# Patient Record
Sex: Female | Born: 1945 | Race: Black or African American | Hispanic: No | Smoking: Never smoker
Health system: Southern US, Community
[De-identification: ages and names within clinical notes are randomized; demographics above are authoritative.]

## PROBLEM LIST (undated history)

## (undated) DIAGNOSIS — M069 Rheumatoid arthritis, unspecified: Secondary | ICD-10-CM

## (undated) DIAGNOSIS — E119 Type 2 diabetes mellitus without complications: Secondary | ICD-10-CM

## (undated) DIAGNOSIS — H269 Unspecified cataract: Secondary | ICD-10-CM

## (undated) DIAGNOSIS — M1990 Unspecified osteoarthritis, unspecified site: Secondary | ICD-10-CM

## (undated) DIAGNOSIS — I10 Essential (primary) hypertension: Secondary | ICD-10-CM

## (undated) DIAGNOSIS — E7800 Pure hypercholesterolemia, unspecified: Secondary | ICD-10-CM

## (undated) DIAGNOSIS — Z973 Presence of spectacles and contact lenses: Secondary | ICD-10-CM

## (undated) DIAGNOSIS — D649 Anemia, unspecified: Secondary | ICD-10-CM

## (undated) HISTORY — PX: ABDOMINAL HYSTERECTOMY: SHX81

## (undated) HISTORY — PX: BUNIONECTOMY: SHX129

## (undated) HISTORY — DX: Unspecified cataract: H26.9

## (undated) HISTORY — PX: CATARACT EXTRACTION, BILATERAL: SHX1313

---

## 2012-02-25 DIAGNOSIS — E669 Obesity, unspecified: Secondary | ICD-10-CM | POA: Diagnosis not present

## 2012-02-25 DIAGNOSIS — E119 Type 2 diabetes mellitus without complications: Secondary | ICD-10-CM | POA: Diagnosis not present

## 2012-02-25 DIAGNOSIS — R5381 Other malaise: Secondary | ICD-10-CM | POA: Diagnosis not present

## 2012-02-25 DIAGNOSIS — I10 Essential (primary) hypertension: Secondary | ICD-10-CM | POA: Diagnosis not present

## 2012-02-25 DIAGNOSIS — R5383 Other fatigue: Secondary | ICD-10-CM | POA: Diagnosis not present

## 2012-03-10 DIAGNOSIS — E669 Obesity, unspecified: Secondary | ICD-10-CM | POA: Diagnosis not present

## 2012-03-10 DIAGNOSIS — E119 Type 2 diabetes mellitus without complications: Secondary | ICD-10-CM | POA: Diagnosis not present

## 2012-03-10 DIAGNOSIS — I10 Essential (primary) hypertension: Secondary | ICD-10-CM | POA: Diagnosis not present

## 2012-03-10 DIAGNOSIS — E7800 Pure hypercholesterolemia, unspecified: Secondary | ICD-10-CM | POA: Diagnosis not present

## 2012-03-23 DIAGNOSIS — M25519 Pain in unspecified shoulder: Secondary | ICD-10-CM | POA: Diagnosis not present

## 2012-03-23 DIAGNOSIS — S199XXA Unspecified injury of neck, initial encounter: Secondary | ICD-10-CM | POA: Diagnosis not present

## 2012-03-23 DIAGNOSIS — S4980XA Other specified injuries of shoulder and upper arm, unspecified arm, initial encounter: Secondary | ICD-10-CM | POA: Diagnosis not present

## 2012-03-23 DIAGNOSIS — S40019A Contusion of unspecified shoulder, initial encounter: Secondary | ICD-10-CM | POA: Diagnosis not present

## 2012-03-23 DIAGNOSIS — S139XXA Sprain of joints and ligaments of unspecified parts of neck, initial encounter: Secondary | ICD-10-CM | POA: Diagnosis not present

## 2012-03-24 DIAGNOSIS — I10 Essential (primary) hypertension: Secondary | ICD-10-CM | POA: Diagnosis not present

## 2012-03-24 DIAGNOSIS — S139XXA Sprain of joints and ligaments of unspecified parts of neck, initial encounter: Secondary | ICD-10-CM | POA: Diagnosis not present

## 2012-03-24 DIAGNOSIS — M5030 Other cervical disc degeneration, unspecified cervical region: Secondary | ICD-10-CM | POA: Diagnosis not present

## 2012-03-24 DIAGNOSIS — M25519 Pain in unspecified shoulder: Secondary | ICD-10-CM | POA: Diagnosis not present

## 2012-03-24 DIAGNOSIS — Z87891 Personal history of nicotine dependence: Secondary | ICD-10-CM | POA: Diagnosis not present

## 2012-03-24 DIAGNOSIS — R51 Headache: Secondary | ICD-10-CM | POA: Diagnosis not present

## 2012-03-24 DIAGNOSIS — Z79899 Other long term (current) drug therapy: Secondary | ICD-10-CM | POA: Diagnosis not present

## 2012-03-24 DIAGNOSIS — S0990XA Unspecified injury of head, initial encounter: Secondary | ICD-10-CM | POA: Diagnosis not present

## 2012-03-24 DIAGNOSIS — M47812 Spondylosis without myelopathy or radiculopathy, cervical region: Secondary | ICD-10-CM | POA: Diagnosis not present

## 2012-03-24 DIAGNOSIS — E119 Type 2 diabetes mellitus without complications: Secondary | ICD-10-CM | POA: Diagnosis not present

## 2012-03-28 DIAGNOSIS — E785 Hyperlipidemia, unspecified: Secondary | ICD-10-CM | POA: Diagnosis not present

## 2012-03-28 DIAGNOSIS — R55 Syncope and collapse: Secondary | ICD-10-CM | POA: Diagnosis not present

## 2012-03-28 DIAGNOSIS — R404 Transient alteration of awareness: Secondary | ICD-10-CM | POA: Diagnosis not present

## 2012-03-28 DIAGNOSIS — Z87891 Personal history of nicotine dependence: Secondary | ICD-10-CM | POA: Diagnosis not present

## 2012-03-28 DIAGNOSIS — R5383 Other fatigue: Secondary | ICD-10-CM | POA: Diagnosis not present

## 2012-03-28 DIAGNOSIS — E7800 Pure hypercholesterolemia, unspecified: Secondary | ICD-10-CM | POA: Diagnosis not present

## 2012-03-28 DIAGNOSIS — Z79899 Other long term (current) drug therapy: Secondary | ICD-10-CM | POA: Diagnosis not present

## 2012-03-28 DIAGNOSIS — N390 Urinary tract infection, site not specified: Secondary | ICD-10-CM | POA: Diagnosis not present

## 2012-03-28 DIAGNOSIS — F411 Generalized anxiety disorder: Secondary | ICD-10-CM | POA: Diagnosis not present

## 2012-03-28 DIAGNOSIS — E119 Type 2 diabetes mellitus without complications: Secondary | ICD-10-CM | POA: Diagnosis not present

## 2012-03-28 DIAGNOSIS — E876 Hypokalemia: Secondary | ICD-10-CM | POA: Diagnosis not present

## 2012-03-28 DIAGNOSIS — I10 Essential (primary) hypertension: Secondary | ICD-10-CM | POA: Diagnosis not present

## 2012-03-28 DIAGNOSIS — I517 Cardiomegaly: Secondary | ICD-10-CM | POA: Diagnosis not present

## 2012-03-29 DIAGNOSIS — E876 Hypokalemia: Secondary | ICD-10-CM | POA: Diagnosis not present

## 2012-03-29 DIAGNOSIS — N390 Urinary tract infection, site not specified: Secondary | ICD-10-CM | POA: Diagnosis not present

## 2012-03-30 DIAGNOSIS — N390 Urinary tract infection, site not specified: Secondary | ICD-10-CM | POA: Diagnosis not present

## 2012-04-05 DIAGNOSIS — R42 Dizziness and giddiness: Secondary | ICD-10-CM | POA: Diagnosis not present

## 2012-04-21 DIAGNOSIS — R55 Syncope and collapse: Secondary | ICD-10-CM | POA: Diagnosis not present

## 2012-04-21 DIAGNOSIS — Z79899 Other long term (current) drug therapy: Secondary | ICD-10-CM | POA: Diagnosis not present

## 2012-04-21 DIAGNOSIS — R5383 Other fatigue: Secondary | ICD-10-CM | POA: Diagnosis not present

## 2012-04-21 DIAGNOSIS — R42 Dizziness and giddiness: Secondary | ICD-10-CM | POA: Diagnosis not present

## 2012-04-21 DIAGNOSIS — I10 Essential (primary) hypertension: Secondary | ICD-10-CM | POA: Diagnosis not present

## 2012-04-21 DIAGNOSIS — R404 Transient alteration of awareness: Secondary | ICD-10-CM | POA: Diagnosis not present

## 2012-04-21 DIAGNOSIS — Z87891 Personal history of nicotine dependence: Secondary | ICD-10-CM | POA: Diagnosis not present

## 2012-04-21 DIAGNOSIS — E7800 Pure hypercholesterolemia, unspecified: Secondary | ICD-10-CM | POA: Diagnosis not present

## 2012-04-21 DIAGNOSIS — E119 Type 2 diabetes mellitus without complications: Secondary | ICD-10-CM | POA: Diagnosis not present

## 2012-04-22 DIAGNOSIS — R012 Other cardiac sounds: Secondary | ICD-10-CM | POA: Diagnosis not present

## 2012-05-19 DIAGNOSIS — R209 Unspecified disturbances of skin sensation: Secondary | ICD-10-CM | POA: Diagnosis not present

## 2012-05-19 DIAGNOSIS — R55 Syncope and collapse: Secondary | ICD-10-CM | POA: Diagnosis not present

## 2012-05-19 DIAGNOSIS — R42 Dizziness and giddiness: Secondary | ICD-10-CM | POA: Diagnosis not present

## 2012-05-20 DIAGNOSIS — E669 Obesity, unspecified: Secondary | ICD-10-CM | POA: Diagnosis not present

## 2012-05-20 DIAGNOSIS — E119 Type 2 diabetes mellitus without complications: Secondary | ICD-10-CM | POA: Diagnosis not present

## 2012-05-20 DIAGNOSIS — I10 Essential (primary) hypertension: Secondary | ICD-10-CM | POA: Diagnosis not present

## 2012-05-20 DIAGNOSIS — M1990 Unspecified osteoarthritis, unspecified site: Secondary | ICD-10-CM | POA: Diagnosis not present

## 2012-05-20 DIAGNOSIS — Z23 Encounter for immunization: Secondary | ICD-10-CM | POA: Diagnosis not present

## 2012-05-25 DIAGNOSIS — R569 Unspecified convulsions: Secondary | ICD-10-CM | POA: Diagnosis not present

## 2012-06-21 DIAGNOSIS — R55 Syncope and collapse: Secondary | ICD-10-CM | POA: Diagnosis not present

## 2012-06-21 DIAGNOSIS — R51 Headache: Secondary | ICD-10-CM | POA: Diagnosis not present

## 2012-08-16 DIAGNOSIS — Z1231 Encounter for screening mammogram for malignant neoplasm of breast: Secondary | ICD-10-CM | POA: Diagnosis not present

## 2012-12-16 DIAGNOSIS — E119 Type 2 diabetes mellitus without complications: Secondary | ICD-10-CM | POA: Diagnosis not present

## 2012-12-30 DIAGNOSIS — H40019 Open angle with borderline findings, low risk, unspecified eye: Secondary | ICD-10-CM | POA: Diagnosis not present

## 2013-03-21 DIAGNOSIS — I10 Essential (primary) hypertension: Secondary | ICD-10-CM | POA: Diagnosis not present

## 2013-03-21 DIAGNOSIS — E119 Type 2 diabetes mellitus without complications: Secondary | ICD-10-CM | POA: Diagnosis not present

## 2013-03-21 DIAGNOSIS — E669 Obesity, unspecified: Secondary | ICD-10-CM | POA: Diagnosis not present

## 2013-03-21 DIAGNOSIS — E7800 Pure hypercholesterolemia, unspecified: Secondary | ICD-10-CM | POA: Diagnosis not present

## 2013-03-24 DIAGNOSIS — R0602 Shortness of breath: Secondary | ICD-10-CM | POA: Diagnosis not present

## 2013-03-24 DIAGNOSIS — I10 Essential (primary) hypertension: Secondary | ICD-10-CM | POA: Diagnosis not present

## 2013-03-24 DIAGNOSIS — Z79899 Other long term (current) drug therapy: Secondary | ICD-10-CM | POA: Diagnosis not present

## 2013-03-24 DIAGNOSIS — T783XXA Angioneurotic edema, initial encounter: Secondary | ICD-10-CM | POA: Diagnosis not present

## 2013-03-24 DIAGNOSIS — Z87891 Personal history of nicotine dependence: Secondary | ICD-10-CM | POA: Diagnosis not present

## 2013-03-24 DIAGNOSIS — Z8249 Family history of ischemic heart disease and other diseases of the circulatory system: Secondary | ICD-10-CM | POA: Diagnosis not present

## 2013-03-24 DIAGNOSIS — E119 Type 2 diabetes mellitus without complications: Secondary | ICD-10-CM | POA: Diagnosis not present

## 2013-07-14 DIAGNOSIS — I10 Essential (primary) hypertension: Secondary | ICD-10-CM | POA: Diagnosis not present

## 2013-07-14 DIAGNOSIS — E119 Type 2 diabetes mellitus without complications: Secondary | ICD-10-CM | POA: Diagnosis not present

## 2013-07-18 DIAGNOSIS — B079 Viral wart, unspecified: Secondary | ICD-10-CM | POA: Diagnosis not present

## 2013-09-26 ENCOUNTER — Encounter (HOSPITAL_COMMUNITY): Payer: Self-pay | Admitting: Emergency Medicine

## 2013-09-26 ENCOUNTER — Emergency Department (HOSPITAL_COMMUNITY)
Admission: EM | Admit: 2013-09-26 | Discharge: 2013-09-27 | Disposition: A | Discharge status: Home / Self Care | Payer: Medicare Other | Attending: Emergency Medicine | Admitting: Emergency Medicine

## 2013-09-26 DIAGNOSIS — R55 Syncope and collapse: Secondary | ICD-10-CM | POA: Diagnosis not present

## 2013-09-26 DIAGNOSIS — R61 Generalized hyperhidrosis: Secondary | ICD-10-CM | POA: Diagnosis not present

## 2013-09-26 DIAGNOSIS — F17200 Nicotine dependence, unspecified, uncomplicated: Secondary | ICD-10-CM | POA: Insufficient documentation

## 2013-09-26 DIAGNOSIS — E7800 Pure hypercholesterolemia, unspecified: Secondary | ICD-10-CM | POA: Diagnosis not present

## 2013-09-26 DIAGNOSIS — I10 Essential (primary) hypertension: Secondary | ICD-10-CM | POA: Diagnosis not present

## 2013-09-26 DIAGNOSIS — I498 Other specified cardiac arrhythmias: Secondary | ICD-10-CM | POA: Insufficient documentation

## 2013-09-26 DIAGNOSIS — E119 Type 2 diabetes mellitus without complications: Secondary | ICD-10-CM | POA: Insufficient documentation

## 2013-09-26 DIAGNOSIS — R42 Dizziness and giddiness: Secondary | ICD-10-CM | POA: Diagnosis not present

## 2013-09-26 DIAGNOSIS — R1110 Vomiting, unspecified: Secondary | ICD-10-CM | POA: Diagnosis not present

## 2013-09-26 DIAGNOSIS — Z79899 Other long term (current) drug therapy: Secondary | ICD-10-CM | POA: Insufficient documentation

## 2013-09-26 DIAGNOSIS — E876 Hypokalemia: Secondary | ICD-10-CM | POA: Insufficient documentation

## 2013-09-26 HISTORY — DX: Pure hypercholesterolemia, unspecified: E78.00

## 2013-09-26 HISTORY — DX: Type 2 diabetes mellitus without complications: E11.9

## 2013-09-26 HISTORY — DX: Essential (primary) hypertension: I10

## 2013-09-26 LAB — CBC WITH DIFFERENTIAL/PLATELET
Basophils Absolute: 0 10*3/uL (ref 0.0–0.1)
Basophils Relative: 0 % (ref 0–1)
EOS ABS: 0.1 10*3/uL (ref 0.0–0.7)
Eosinophils Relative: 2 % (ref 0–5)
HCT: 34.8 % — ABNORMAL LOW (ref 36.0–46.0)
HEMOGLOBIN: 12 g/dL (ref 12.0–15.0)
LYMPHS ABS: 2.2 10*3/uL (ref 0.7–4.0)
Lymphocytes Relative: 57 % — ABNORMAL HIGH (ref 12–46)
MCH: 29.9 pg (ref 26.0–34.0)
MCHC: 34.5 g/dL (ref 30.0–36.0)
MCV: 86.8 fL (ref 78.0–100.0)
MONO ABS: 0.4 10*3/uL (ref 0.1–1.0)
MONOS PCT: 10 % (ref 3–12)
Neutro Abs: 1.2 10*3/uL — ABNORMAL LOW (ref 1.7–7.7)
Neutrophils Relative %: 31 % — ABNORMAL LOW (ref 43–77)
PLATELETS: 173 10*3/uL (ref 150–400)
RBC: 4.01 MIL/uL (ref 3.87–5.11)
RDW: 13.5 % (ref 11.5–15.5)
WBC: 3.9 10*3/uL — ABNORMAL LOW (ref 4.0–10.5)

## 2013-09-26 NOTE — ED Notes (Signed)
Pt was sitting in her daughters room and became diaphoretic, and felt like she was going to pass out. Pt placed on stretcher and cbg. Pt currently alert sts she feels better.

## 2013-09-26 NOTE — ED Notes (Signed)
Pt st's she has been caring for her daughter at home who has a femur fx.  St's she thinks she is just exhausted.  St's she started getting hot then nauseated and vomited.  Pt st's she feels much better at this time.  Pt denies any pain or other discomforts at this time.

## 2013-09-26 NOTE — ED Provider Notes (Signed)
CSN: [id number]     Arrival date & time 09/26/13  2127 History   First MD Initiated Contact with Patient 09/26/13 2137     Chief Complaint  Patient presents with  . Near Syncope   (Consider location/radiation/quality/duration/timing/severity/associated sxs/prior Treatment) Patient is a 68 y.o. female presenting with near-syncope. The history is provided by the patient.  Near Syncope  She was in the ED with her daughter is being admitted following a fall. She suddenly started getting lightheaded and sick her stomach and felt hot. She either passed out or nearly passed out. She was sweaty at this time. She did vomit once. No seizure activity was seen. She is feeling much better now. She is diabetic and blood sugar was checked and was 162. She states that there was nothing particularly upsetting at the time that she started feeling bad.  Past Medical History  Diagnosis Date  . Diabetes mellitus without complication   . Hypertension   . High cholesterol    History reviewed. No pertinent past surgical history. History reviewed. No pertinent family history. History  Substance Use Topics  . Smoking status: Current Some Day Smoker  . Smokeless tobacco: Not on file  . Alcohol Use: No   OB History   Grav Para Term Preterm Abortions TAB SAB Ect Mult Living                 Review of Systems  Cardiovascular: Positive for near-syncope.  All other systems reviewed and are negative.    Allergies  Review of patient's allergies indicates no known allergies.  Home Medications   Current Outpatient Rx  Name  Route  Sig  Dispense  Refill  . ALPRAZolam (XANAX) 0.5 MG tablet   Oral   Take 0.5 mg by mouth 2 (two) times daily as needed for anxiety.          [name] amLODipine (NORVASC) 10 MG tablet   Oral   Take 10 mg by mouth daily.          [name] atorvastatin (LIPITOR) 40 MG tablet   Oral   Take 40 mg by mouth daily at 6 PM.          . hydrochlorothiazide (HYDRODIURIL) 25 MG tablet  Oral   Take 25 mg by mouth daily.          [name] labetalol (NORMODYNE) 200 MG tablet   Oral   Take 400 mg by mouth 2 (two) times daily.          . metFORMIN (GLUCOPHAGE) 500 MG tablet   Oral   Take 500 mg by mouth 2 (two) times daily with a meal.          . Multiple Vitamin (MULTIVITAMIN WITH MINERALS) TABS tablet   Oral   Take 1 tablet by mouth daily.          BP 130/75  Pulse 51  Temp(Src) 98.1 F (36.7 C) (Oral)  Resp 14  SpO2 98% Physical Exam  Nursing note and vitals reviewed.  68 year old female, resting comfortably and in no acute distress. Vital signs are significant for bradycardia with heart rate of 51. Oxygen saturation is 98%, which is normal. Head is normocephalic and atraumatic. PERRLA, EOMI. Oropharynx is clear. Neck is nontender and supple without adenopathy or JVD. Back is nontender and there is no CVA tenderness. Lungs are clear without rales, wheezes, or rhonchi. Chest is nontender. Heart has regular rate and rhythm without murmur. Abdomen is soft, flat, nontender without  masses or hepatosplenomegaly and peristalsis is normoactive. Extremities have no cyanosis or edema, full range of motion is present. Skin is warm and dry without rash. Neurologic: Mental status is normal, cranial nerves are intact, there are no motor or sensory deficits.  ED Course  Procedures (including critical care time) Labs Review   Date: 09/26/2013  Rate: 57  Rhythm: sinus bradycardia  QRS Axis: normal  Intervals: normal  ST/T Wave abnormalities: normal  Conduction Disutrbances:none  Narrative Interpretation: Sinus bradycardia, left ventricular hypertrophy, old inferior wall myocardial infarction. No prior ECG available for comparison.  Old EKG Reviewed: None available  MDM   1. Vasovagal near syncope    Syncope or near syncope which appears to be vasovagal. I do not see any red flags on her history or physical. Screening labs will be obtained and she'll be observed  for a period of time in the ED. Orthostatic vital signs will be checked.  Orthostatic vital signs are unremarkable. Heart rate is back above 70 at the time of with a static testing which supports the idea of original episode being vasovagal. Electrolytes are still pending. Case is signed out to Dr. Doy Mince to evaluate those results.  Delora Fuel, MD 50/93/26 [id number]

## 2013-09-26 NOTE — ED Notes (Signed)
CBG 162 

## 2013-09-27 LAB — BASIC METABOLIC PANEL
BUN: 19 mg/dL (ref 6–23)
CALCIUM: 9.5 mg/dL (ref 8.4–10.5)
CO2: 23 meq/L (ref 19–32)
Chloride: 102 mEq/L (ref 96–112)
Creatinine, Ser: 0.9 mg/dL (ref 0.50–1.10)
GFR calc Af Amer: 74 mL/min — ABNORMAL LOW (ref >90)
GFR, EST NON AFRICAN AMERICAN: 64 mL/min — AB (ref >90)
GLUCOSE: 172 mg/dL — AB (ref 70–99)
Potassium: 3.4 mEq/L — ABNORMAL LOW (ref 3.7–5.3)
Sodium: 141 mEq/L (ref 137–147)

## 2013-09-27 LAB — GLUCOSE, CAPILLARY: Glucose-Capillary: 162 mg/dL — ABNORMAL HIGH (ref 70–99)

## 2013-09-27 NOTE — ED Notes (Signed)
MD Wofford aware of patients lab results, family updated

## 2013-09-27 NOTE — ED Notes (Signed)
Lab called for update on BMP results, states result should be available in 20-30 min

## 2013-09-27 NOTE — ED Provider Notes (Signed)
BMP shows mild hypokalemia.  Advised increased PO intake and follow up.   Houston Siren, MD 09/27/13 [phone]

## 2013-09-27 NOTE — ED Notes (Signed)
Pt resting with no complaints voiced.  Lights turned down for comfort.

## 2013-09-27 NOTE — Discharge Instructions (Signed)
Vasovagal Syncope, Adult  Syncope, commonly known as fainting, is a temporary loss of consciousness. It occurs when the blood flow to the brain is reduced. Vasovagal syncope (also called neurocardiogenic syncope) is a fainting spell in which the blood flow to the brain is reduced because of a sudden drop in heart rate and blood pressure. Vasovagal syncope occurs when the brain and the cardiovascular system (blood vessels) do not adequately communicate and respond to each other. This is the most common cause of fainting. It often occurs in response to fear or some other type of emotional or physical stress. The body has a reaction in which the heart starts beating too slowly or the blood vessels expand, reducing blood pressure. This type of fainting spell is generally considered harmless. However, injuries can occur if a person takes a sudden fall during a fainting spell.   CAUSES   Vasovagal syncope occurs when a person's blood pressure and heart rate decrease suddenly, usually in response to a trigger. Many things and situations can trigger an episode. Some of these include:   · Pain.    · Fear.    · The sight of blood or medical procedures, such as blood being drawn from a vein.    · Common activities, such as coughing, swallowing, stretching, or going to the bathroom.    · Emotional stress.    · Prolonged standing, especially in a warm environment.    · Lack of sleep or rest.    · Prolonged lack of food.    · Prolonged lack of fluids.    · Recent illness.  · The use of certain drugs that affect blood pressure, such as cocaine, alcohol, marijuana, inhalants, and opiates.    SYMPTOMS   Before the fainting episode, you may:   · Feel dizzy or light headed.    · Become pale.  · Sense that you are going to faint.    · Feel like the room is spinning.    · Have tunnel vision, only seeing directly in front of you.    · Feel sick to your stomach (nauseous).    · See spots or slowly lose vision.    · Hear ringing in your  ears.    · Have a headache.    · Feel warm and sweaty.    · Feel a sensation of pins and needles.  During the fainting spell, you will generally be unconscious for no longer than a couple minutes before waking up and returning to normal. If you get up too quickly before your body can recover, you may faint again. Some twitching or jerky movements may occur during the fainting spell.   DIAGNOSIS   Your caregiver will ask about your symptoms, take a medical history, and perform a physical exam. Various tests may be done to rule out other causes of fainting. These may include blood tests and tests to check the heart, such as electrocardiography, echocardiography, and possibly an electrophysiology study. When other causes have been ruled out, a test may be done to check the body's response to changes in position (tilt table test).  TREATMENT   Most cases of vasovagal syncope do not require treatment. Your caregiver may recommend ways to avoid fainting triggers and may provide home strategies for preventing fainting. If you must be exposed to a possible trigger, you can drink additional fluids to help reduce your chances of having an episode of vasovagal syncope. If you have warning signs of an oncoming episode, you can respond by positioning yourself favorably (lying down).  If your fainting spells continue, you may be   given medicines to prevent fainting. Some medicines may help make you more resistant to repeated episodes of vasovagal syncope. Special exercises or compression stockings may be recommended. In rare cases, the surgical placement of a pacemaker is considered.  HOME CARE INSTRUCTIONS   · Learn to identify the warning signs of vasovagal syncope.    · Sit or lie down at the first warning sign of a fainting spell. If sitting, put your head down between your legs. If you lie down, swing your legs up in the air to increase blood flow to the brain.    · Avoid hot tubs and saunas.  · Avoid prolonged  standing.  · Drink enough fluids to keep your urine clear or pale yellow. Avoid caffeine.  · Increase salt in your diet as directed by your caregiver.    · If you have to stand for a long time, perform movements such as:    · Crossing your legs.    · Flexing and stretching your leg muscles.    · Squatting.    · Moving your legs.    · Bending over.    · Only take over-the-counter or prescription medicines as directed by your caregiver. Do not suddenly stop any medicines without asking your caregiver first.   SEEK MEDICAL CARE IF:   · Your fainting spells continue or happen more frequently in spite of treatment.    · You lose consciousness for more than a couple minutes.  · You have fainting spells during or after exercising or after being startled.    · You have new symptoms that occur with the fainting spells, such as:    · Shortness of breath.  · Chest pain.    · Irregular heartbeat.    · You have episodes of twitching or jerky movements that last longer than a few seconds.  · You have episodes of twitching or jerky movements without obvious fainting.  SEEK IMMEDIATE MEDICAL CARE IF:   · You have injuries or bleeding after a fainting spell.    · You have episodes of twitching or jerky movements that last longer than 5 minutes.    · You have more than one spell of twitching or jerky movements before returning to consciousness after fainting.  MAKE SURE YOU:   · Understand these instructions.  · Will watch your condition.  · Will get help right away if you are not doing well or get worse.  Document Released: 08/11/2012 Document Reviewed: 08/11/2012  [employer]® Patient Information ©2014 [employer].

## 2013-10-13 DIAGNOSIS — I10 Essential (primary) hypertension: Secondary | ICD-10-CM | POA: Diagnosis not present

## 2013-10-13 DIAGNOSIS — E669 Obesity, unspecified: Secondary | ICD-10-CM | POA: Diagnosis not present

## 2013-10-13 DIAGNOSIS — E119 Type 2 diabetes mellitus without complications: Secondary | ICD-10-CM | POA: Diagnosis not present

## 2013-10-15 DIAGNOSIS — J069 Acute upper respiratory infection, unspecified: Secondary | ICD-10-CM | POA: Diagnosis not present

## 2013-11-29 DIAGNOSIS — E669 Obesity, unspecified: Secondary | ICD-10-CM | POA: Diagnosis not present

## 2013-11-29 DIAGNOSIS — E109 Type 1 diabetes mellitus without complications: Secondary | ICD-10-CM | POA: Diagnosis not present

## 2013-11-29 DIAGNOSIS — I10 Essential (primary) hypertension: Secondary | ICD-10-CM | POA: Diagnosis not present

## 2014-01-18 DIAGNOSIS — L259 Unspecified contact dermatitis, unspecified cause: Secondary | ICD-10-CM | POA: Diagnosis not present

## 2014-01-18 DIAGNOSIS — I10 Essential (primary) hypertension: Secondary | ICD-10-CM | POA: Diagnosis not present

## 2014-01-18 DIAGNOSIS — E119 Type 2 diabetes mellitus without complications: Secondary | ICD-10-CM | POA: Diagnosis not present

## 2014-05-25 ENCOUNTER — Other Ambulatory Visit (HOSPITAL_COMMUNITY): Payer: Self-pay | Admitting: Internal Medicine

## 2014-05-25 DIAGNOSIS — Z23 Encounter for immunization: Secondary | ICD-10-CM | POA: Diagnosis not present

## 2014-05-25 DIAGNOSIS — Z1231 Encounter for screening mammogram for malignant neoplasm of breast: Secondary | ICD-10-CM

## 2014-05-25 DIAGNOSIS — IMO0002 Reserved for concepts with insufficient information to code with codable children: Secondary | ICD-10-CM | POA: Diagnosis not present

## 2014-05-25 DIAGNOSIS — E119 Type 2 diabetes mellitus without complications: Secondary | ICD-10-CM | POA: Diagnosis not present

## 2014-05-25 DIAGNOSIS — I10 Essential (primary) hypertension: Secondary | ICD-10-CM | POA: Diagnosis not present

## 2014-05-25 DIAGNOSIS — M1710 Unilateral primary osteoarthritis, unspecified knee: Secondary | ICD-10-CM | POA: Diagnosis not present

## 2014-05-25 DIAGNOSIS — E785 Hyperlipidemia, unspecified: Secondary | ICD-10-CM | POA: Diagnosis not present

## 2014-05-26 ENCOUNTER — Other Ambulatory Visit (HOSPITAL_COMMUNITY): Payer: Self-pay | Admitting: Internal Medicine

## 2014-05-26 DIAGNOSIS — M810 Age-related osteoporosis without current pathological fracture: Secondary | ICD-10-CM

## 2014-05-26 DIAGNOSIS — M8580 Other specified disorders of bone density and structure, unspecified site: Secondary | ICD-10-CM

## 2014-05-31 ENCOUNTER — Other Ambulatory Visit (HOSPITAL_COMMUNITY): Payer: Medicare Other

## 2014-05-31 ENCOUNTER — Ambulatory Visit (HOSPITAL_COMMUNITY)
Admission: RE | Admit: 2014-05-31 | Discharge: 2014-05-31 | Disposition: A | Discharge status: Home / Self Care | Payer: Medicare Other | Source: Ambulatory Visit | Attending: Internal Medicine | Admitting: Internal Medicine

## 2014-05-31 DIAGNOSIS — Z1231 Encounter for screening mammogram for malignant neoplasm of breast: Secondary | ICD-10-CM | POA: Insufficient documentation

## 2014-06-05 ENCOUNTER — Other Ambulatory Visit (HOSPITAL_COMMUNITY): Payer: Medicare Other

## 2014-06-05 DIAGNOSIS — M1710 Unilateral primary osteoarthritis, unspecified knee: Secondary | ICD-10-CM | POA: Diagnosis not present

## 2014-06-12 ENCOUNTER — Other Ambulatory Visit (HOSPITAL_COMMUNITY): Payer: Medicare Other

## 2014-06-12 DIAGNOSIS — M170 Bilateral primary osteoarthritis of knee: Secondary | ICD-10-CM | POA: Diagnosis not present

## 2014-06-20 DIAGNOSIS — M170 Bilateral primary osteoarthritis of knee: Secondary | ICD-10-CM | POA: Diagnosis not present

## 2014-06-21 ENCOUNTER — Other Ambulatory Visit: Payer: Self-pay | Admitting: Orthopedic Surgery

## 2014-06-27 ENCOUNTER — Other Ambulatory Visit: Payer: Self-pay | Admitting: Orthopedic Surgery

## 2014-06-28 ENCOUNTER — Ambulatory Visit (HOSPITAL_COMMUNITY)
Admission: RE | Admit: 2014-06-28 | Discharge: 2014-06-28 | Disposition: A | Discharge status: Home / Self Care | Payer: Medicare HMO | Source: Ambulatory Visit | Attending: Internal Medicine | Admitting: Internal Medicine

## 2014-06-28 DIAGNOSIS — M8580 Other specified disorders of bone density and structure, unspecified site: Secondary | ICD-10-CM | POA: Diagnosis not present

## 2014-06-28 DIAGNOSIS — M810 Age-related osteoporosis without current pathological fracture: Secondary | ICD-10-CM | POA: Diagnosis not present

## 2014-07-12 DIAGNOSIS — M1712 Unilateral primary osteoarthritis, left knee: Secondary | ICD-10-CM | POA: Diagnosis not present

## 2014-07-13 NOTE — Pre-Procedure Instructions (Signed)
Toinette Lackie  07/13/2014   Your procedure is scheduled on:  Monday, November 16th  Report to [facility] at 645 AM.  Call this number if you have problems the morning of surgery: [phone]   Remember:   Do not eat food or drink liquids after midnight.   Take these medicines the morning of surgery with A SIP OF WATER: norvasc, labetalol, xanax if needed   Do not wear jewelry, make-up or nail polish.  Do not wear lotions, powders, or perfume, deodorant.  Do not shave 48 hours prior to surgery. Men may shave face and neck.  Do not bring valuables to the hospital.  [facility] is not responsible for any belongings or valuables.               Contacts, dentures or bridgework may not be worn into surgery.  Leave suitcase in the car. After surgery it may be brought to your room.  For patients admitted to the hospital, discharge time is determined by your  treatment team.         Please read over the following fact sheets that you were given: Pain Booklet, Coughing and Deep Breathing, MRSA Information and Surgical Site Infection Prevention  [place] - Preparing for Surgery  Before surgery, you can play an important role.  Because skin is not sterile, your skin needs to be as free of germs as possible.  You can reduce the number of germs on you skin by washing with CHG (chlorahexidine gluconate) soap before surgery.  CHG is an antiseptic cleaner which kills germs and bonds with the skin to continue killing germs even after washing.  Please DO NOT use if you have an allergy to CHG or antibacterial soaps.  If your skin becomes reddened/irritated stop using the CHG and inform your nurse when you arrive at Short Stay.  Do not shave (including legs and underarms) for at least 48 hours prior to the first CHG shower.  You may shave your face.  Please follow these instructions carefully:   1.  Shower with CHG Soap the night before surgery and the morning of Surgery.  2.   If you choose to wash your hair, wash your hair first as usual with your normal shampoo.  3.  After you shampoo, rinse your hair and body thoroughly to remove the shampoo.  4.  Use CHG as you would any other liquid soap.  You can apply CHG directly to the skin and wash gently with scrungie or a clean washcloth.  5.  Apply the CHG Soap to your body ONLY FROM THE NECK DOWN.  Do not use on open wounds or open sores.  Avoid contact with your eyes, ears, mouth and genitals (private parts).  Wash genitals (private parts) with your normal soap.  6.  Wash thoroughly, paying special attention to the area where your surgery will be performed.  7.  Thoroughly rinse your body with warm water from the neck down.  8.  DO NOT shower/wash with your normal soap after using and rinsing off the CHG Soap.  9.  Pat yourself dry with a clean towel.            10.  Wear clean pajamas.            11.  Place clean sheets on your bed the night of your first shower and do not sleep with pets.  Day of Surgery  Do not apply any lotions/deoderants the  morning of surgery.  Please wear clean clothes to the hospital/surgery center.

## 2014-07-14 ENCOUNTER — Encounter (HOSPITAL_COMMUNITY): Payer: Self-pay

## 2014-07-14 ENCOUNTER — Encounter (HOSPITAL_COMMUNITY)
Admission: RE | Admit: 2014-07-14 | Discharge: 2014-07-14 | Disposition: A | Discharge status: Home / Self Care | Payer: Medicare Other | Source: Ambulatory Visit | Attending: Orthopedic Surgery | Admitting: Orthopedic Surgery

## 2014-07-14 DIAGNOSIS — E780 Pure hypercholesterolemia: Secondary | ICD-10-CM | POA: Insufficient documentation

## 2014-07-14 DIAGNOSIS — Z01812 Encounter for preprocedural laboratory examination: Secondary | ICD-10-CM | POA: Insufficient documentation

## 2014-07-14 DIAGNOSIS — M179 Osteoarthritis of knee, unspecified: Secondary | ICD-10-CM | POA: Diagnosis not present

## 2014-07-14 DIAGNOSIS — E119 Type 2 diabetes mellitus without complications: Secondary | ICD-10-CM | POA: Insufficient documentation

## 2014-07-14 DIAGNOSIS — I10 Essential (primary) hypertension: Secondary | ICD-10-CM | POA: Diagnosis not present

## 2014-07-14 DIAGNOSIS — Z01818 Encounter for other preprocedural examination: Secondary | ICD-10-CM | POA: Diagnosis not present

## 2014-07-14 LAB — CBC WITH DIFFERENTIAL/PLATELET
BASOS ABS: 0 10*3/uL (ref 0.0–0.1)
Basophils Relative: 1 % (ref 0–1)
EOS ABS: 0.2 10*3/uL (ref 0.0–0.7)
EOS PCT: 4 % (ref 0–5)
HCT: 37.5 % (ref 36.0–46.0)
Hemoglobin: 12.9 g/dL (ref 12.0–15.0)
LYMPHS PCT: 45 % (ref 12–46)
Lymphs Abs: 1.9 10*3/uL (ref 0.7–4.0)
MCH: 30.2 pg (ref 26.0–34.0)
MCHC: 34.4 g/dL (ref 30.0–36.0)
MCV: 87.8 fL (ref 78.0–100.0)
Monocytes Absolute: 0.3 10*3/uL (ref 0.1–1.0)
Monocytes Relative: 8 % (ref 3–12)
Neutro Abs: 1.9 10*3/uL (ref 1.7–7.7)
Neutrophils Relative %: 44 % (ref 43–77)
Platelets: 220 10*3/uL (ref 150–400)
RBC: 4.27 MIL/uL (ref 3.87–5.11)
RDW: 13.3 % (ref 11.5–15.5)
WBC: 4.3 10*3/uL (ref 4.0–10.5)

## 2014-07-14 LAB — COMPREHENSIVE METABOLIC PANEL
ALK PHOS: 93 U/L (ref 39–117)
ALT: 11 U/L (ref 0–35)
AST: 15 U/L (ref 0–37)
Albumin: 4.1 g/dL (ref 3.5–5.2)
Anion gap: 17 — ABNORMAL HIGH (ref 5–15)
BUN: 17 mg/dL (ref 6–23)
CALCIUM: 9.6 mg/dL (ref 8.4–10.5)
CO2: 21 mEq/L (ref 19–32)
Chloride: 105 mEq/L (ref 96–112)
Creatinine, Ser: 0.8 mg/dL (ref 0.50–1.10)
GFR calc Af Amer: 85 mL/min — ABNORMAL LOW (ref >90)
GFR calc non Af Amer: 74 mL/min — ABNORMAL LOW (ref >90)
Glucose, Bld: 126 mg/dL — ABNORMAL HIGH (ref 70–99)
POTASSIUM: 4.2 meq/L (ref 3.7–5.3)
Sodium: 143 mEq/L (ref 137–147)
Total Bilirubin: 0.4 mg/dL (ref 0.3–1.2)
Total Protein: 8 g/dL (ref 6.0–8.3)

## 2014-07-14 LAB — URINALYSIS, ROUTINE W REFLEX MICROSCOPIC
Bilirubin Urine: NEGATIVE
Glucose, UA: NEGATIVE mg/dL
Hgb urine dipstick: NEGATIVE
Ketones, ur: NEGATIVE mg/dL
NITRITE: NEGATIVE
Protein, ur: NEGATIVE mg/dL
Specific Gravity, Urine: 1.015 (ref 1.005–1.030)
Urobilinogen, UA: 0.2 mg/dL (ref 0.0–1.0)
pH: 6 (ref 5.0–8.0)

## 2014-07-14 LAB — PROTIME-INR
INR: 1 (ref 0.00–1.49)
Prothrombin Time: 13.3 seconds (ref 11.6–15.2)

## 2014-07-14 LAB — SURGICAL PCR SCREEN
MRSA, PCR: NEGATIVE
Staphylococcus aureus: NEGATIVE

## 2014-07-14 LAB — APTT: aPTT: 34 seconds (ref 24–37)

## 2014-07-14 LAB — URINE MICROSCOPIC-ADD ON

## 2014-07-14 NOTE — Progress Notes (Signed)
Denies any heart problems, has never seen a cardio and never had any heart tests done.  DA

## 2014-07-17 LAB — URINE CULTURE: Colony Count: 75000

## 2014-07-23 MED ORDER — CEFAZOLIN SODIUM-DEXTROSE 2-3 GM-% IV SOLR
2.0000 g | INTRAVENOUS | Status: AC
  Administered 2014-07-24: 2 g via INTRAVENOUS
  Filled 2014-07-23: qty 50

## 2014-07-23 MED ORDER — TRANEXAMIC ACID 100 MG/ML IV SOLN
1000.0000 mg | INTRAVENOUS | Status: AC
  Administered 2014-07-24: 1000 mg via INTRAVENOUS
  Filled 2014-07-23: qty 10

## 2014-07-24 ENCOUNTER — Inpatient Hospital Stay (HOSPITAL_COMMUNITY): Payer: Medicare Other | Admitting: Certified Registered"

## 2014-07-24 ENCOUNTER — Encounter (HOSPITAL_COMMUNITY): Payer: Self-pay | Admitting: *Deleted

## 2014-07-24 ENCOUNTER — Inpatient Hospital Stay (HOSPITAL_COMMUNITY)
Admission: RE | Admit: 2014-07-24 | Discharge: 2014-07-25 | DRG: 470 | Disposition: A | Discharge status: Home Health | Payer: Medicare Other | Source: Ambulatory Visit | Attending: Orthopedic Surgery | Admitting: Orthopedic Surgery

## 2014-07-24 ENCOUNTER — Encounter (HOSPITAL_COMMUNITY)
Admission: RE | Disposition: A | Discharge status: Home Health | Payer: Self-pay | Source: Ambulatory Visit | Attending: Orthopedic Surgery

## 2014-07-24 DIAGNOSIS — E780 Pure hypercholesterolemia: Secondary | ICD-10-CM | POA: Diagnosis present

## 2014-07-24 DIAGNOSIS — D62 Acute posthemorrhagic anemia: Secondary | ICD-10-CM | POA: Diagnosis not present

## 2014-07-24 DIAGNOSIS — M179 Osteoarthritis of knee, unspecified: Secondary | ICD-10-CM | POA: Diagnosis not present

## 2014-07-24 DIAGNOSIS — I10 Essential (primary) hypertension: Secondary | ICD-10-CM | POA: Diagnosis present

## 2014-07-24 DIAGNOSIS — M1712 Unilateral primary osteoarthritis, left knee: Secondary | ICD-10-CM | POA: Diagnosis not present

## 2014-07-24 DIAGNOSIS — M25562 Pain in left knee: Secondary | ICD-10-CM | POA: Diagnosis not present

## 2014-07-24 DIAGNOSIS — E119 Type 2 diabetes mellitus without complications: Secondary | ICD-10-CM | POA: Diagnosis present

## 2014-07-24 DIAGNOSIS — Z96659 Presence of unspecified artificial knee joint: Secondary | ICD-10-CM

## 2014-07-24 DIAGNOSIS — G8918 Other acute postprocedural pain: Secondary | ICD-10-CM | POA: Diagnosis not present

## 2014-07-24 HISTORY — PX: TOTAL KNEE ARTHROPLASTY: SHX125

## 2014-07-24 HISTORY — DX: Unspecified osteoarthritis, unspecified site: M19.90

## 2014-07-24 LAB — CREATININE, SERUM
Creatinine, Ser: 0.76 mg/dL (ref 0.50–1.10)
GFR calc Af Amer: >90 mL/min (ref >90)
GFR calc non Af Amer: 84 mL/min — ABNORMAL LOW (ref >90)

## 2014-07-24 LAB — CBC
HEMATOCRIT: 35.7 % — AB (ref 36.0–46.0)
Hemoglobin: 12.1 g/dL (ref 12.0–15.0)
MCH: 30.6 pg (ref 26.0–34.0)
MCHC: 33.9 g/dL (ref 30.0–36.0)
MCV: 90.2 fL (ref 78.0–100.0)
PLATELETS: 194 10*3/uL (ref 150–400)
RBC: 3.96 MIL/uL (ref 3.87–5.11)
RDW: 13.4 % (ref 11.5–15.5)
WBC: 3.9 10*3/uL — ABNORMAL LOW (ref 4.0–10.5)

## 2014-07-24 LAB — GLUCOSE, CAPILLARY
GLUCOSE-CAPILLARY: 144 mg/dL — AB (ref 70–99)
GLUCOSE-CAPILLARY: 110 mg/dL — AB (ref 70–99)
GLUCOSE-CAPILLARY: 106 mg/dL — AB (ref 70–99)
GLUCOSE-CAPILLARY: 136 mg/dL — AB (ref 70–99)
Glucose-Capillary: 136 mg/dL — ABNORMAL HIGH (ref 70–99)

## 2014-07-24 SURGERY — ARTHROPLASTY, KNEE, TOTAL
Anesthesia: Spinal | Laterality: Left

## 2014-07-24 MED ORDER — HYDROMORPHONE HCL 1 MG/ML IJ SOLN: 1.0000 mg | INTRAMUSCULAR | Status: DC | PRN

## 2014-07-24 MED ORDER — BUPIVACAINE LIPOSOME 1.3 % IJ SUSP
INTRAMUSCULAR | Status: DC | PRN
  Administered 2014-07-24: 20 mL

## 2014-07-24 MED ORDER — METOCLOPRAMIDE HCL 10 MG PO TABS: 5.0000 mg | ORAL_TABLET | Freq: Three times a day (TID) | ORAL | Status: DC | PRN

## 2014-07-24 MED ORDER — ONDANSETRON HCL 4 MG/2ML IJ SOLN
INTRAMUSCULAR | Status: DC | PRN
  Administered 2014-07-24: 4 mg via INTRAVENOUS

## 2014-07-24 MED ORDER — METHOCARBAMOL 500 MG PO TABS
500.0000 mg | ORAL_TABLET | Freq: Four times a day (QID) | ORAL | Status: DC | PRN
  Administered 2014-07-25: 500 mg via ORAL
  Filled 2014-07-24: qty 1

## 2014-07-24 MED ORDER — BUPIVACAINE-EPINEPHRINE 0.5% -1:200000 IJ SOLN
INTRAMUSCULAR | Status: DC | PRN
  Administered 2014-07-24: 30 mL

## 2014-07-24 MED ORDER — MIDAZOLAM HCL 2 MG/2ML IJ SOLN
INTRAMUSCULAR | Status: AC
  Administered 2014-07-24: 1 mg via INTRAVENOUS
  Filled 2014-07-24: qty 2

## 2014-07-24 MED ORDER — OXYCODONE HCL ER 10 MG PO T12A
10.0000 mg | EXTENDED_RELEASE_TABLET | Freq: Two times a day (BID) | ORAL | Status: DC
  Administered 2014-07-24 – 2014-07-25 (×3): 10 mg via ORAL
  Filled 2014-07-24 (×3): qty 1

## 2014-07-24 MED ORDER — SODIUM CHLORIDE 0.9 % IV SOLN: INTRAVENOUS | Status: DC

## 2014-07-24 MED ORDER — BISACODYL 5 MG PO TBEC: 5.0000 mg | DELAYED_RELEASE_TABLET | Freq: Every day | ORAL | Status: DC | PRN

## 2014-07-24 MED ORDER — BUPIVACAINE IN DEXTROSE 0.75-8.25 % IT SOLN
INTRATHECAL | Status: DC | PRN
  Administered 2014-07-24: 1.8 mL via INTRATHECAL

## 2014-07-24 MED ORDER — ROPIVACAINE HCL 5 MG/ML IJ SOLN
INTRAMUSCULAR | Status: DC | PRN
  Administered 2014-07-24: 20 mL via PERINEURAL

## 2014-07-24 MED ORDER — METOCLOPRAMIDE HCL 5 MG/ML IJ SOLN: 5.0000 mg | Freq: Three times a day (TID) | INTRAMUSCULAR | Status: DC | PRN

## 2014-07-24 MED ORDER — LIDOCAINE HCL (CARDIAC) 20 MG/ML IV SOLN
INTRAVENOUS | Status: DC | PRN
  Administered 2014-07-24: 40 mg via INTRAVENOUS

## 2014-07-24 MED ORDER — ACETAMINOPHEN 325 MG PO TABS: 650.0000 mg | ORAL_TABLET | Freq: Four times a day (QID) | ORAL | Status: DC | PRN

## 2014-07-24 MED ORDER — SUCCINYLCHOLINE CHLORIDE 20 MG/ML IJ SOLN
INTRAMUSCULAR | Status: AC
  Filled 2014-07-24: qty 1

## 2014-07-24 MED ORDER — BUPIVACAINE LIPOSOME 1.3 % IJ SUSP
20.0000 mL | Freq: Once | INTRAMUSCULAR | Status: DC
  Filled 2014-07-24: qty 20

## 2014-07-24 MED ORDER — LABETALOL HCL 200 MG PO TABS
400.0000 mg | ORAL_TABLET | Freq: Two times a day (BID) | ORAL | Status: DC
  Administered 2014-07-24 – 2014-07-25 (×2): 400 mg via ORAL
  Filled 2014-07-24 (×3): qty 2

## 2014-07-24 MED ORDER — ZOLPIDEM TARTRATE 5 MG PO TABS: 5.0000 mg | ORAL_TABLET | Freq: Every evening | ORAL | Status: DC | PRN

## 2014-07-24 MED ORDER — CHLORHEXIDINE GLUCONATE 4 % EX LIQD: 60.0000 mL | Freq: Once | CUTANEOUS | Status: DC

## 2014-07-24 MED ORDER — CEFAZOLIN SODIUM-DEXTROSE 2-3 GM-% IV SOLR
2.0000 g | Freq: Four times a day (QID) | INTRAVENOUS | Status: AC
  Administered 2014-07-24 (×2): 2 g via INTRAVENOUS
  Filled 2014-07-24 (×2): qty 50

## 2014-07-24 MED ORDER — FENTANYL CITRATE 0.05 MG/ML IJ SOLN
INTRAMUSCULAR | Status: AC
  Filled 2014-07-24: qty 5

## 2014-07-24 MED ORDER — SODIUM CHLORIDE 0.9 % IV SOLN
INTRAVENOUS | Status: DC
  Administered 2014-07-24: 13:00:00 via INTRAVENOUS

## 2014-07-24 MED ORDER — CELECOXIB 200 MG PO CAPS
200.0000 mg | ORAL_CAPSULE | Freq: Two times a day (BID) | ORAL | Status: DC
  Administered 2014-07-24 – 2014-07-25 (×3): 200 mg via ORAL
  Filled 2014-07-24 (×4): qty 1

## 2014-07-24 MED ORDER — PROPOFOL 10 MG/ML IV BOLUS
INTRAVENOUS | Status: AC
  Filled 2014-07-24: qty 20

## 2014-07-24 MED ORDER — SENNOSIDES-DOCUSATE SODIUM 8.6-50 MG PO TABS: 1.0000 | ORAL_TABLET | Freq: Every evening | ORAL | Status: DC | PRN

## 2014-07-24 MED ORDER — FENTANYL CITRATE 0.05 MG/ML IJ SOLN
INTRAMUSCULAR | Status: DC | PRN
  Administered 2014-07-24: 50 ug via INTRAVENOUS

## 2014-07-24 MED ORDER — LIDOCAINE HCL (CARDIAC) 20 MG/ML IV SOLN
INTRAVENOUS | Status: AC
  Filled 2014-07-24: qty 5

## 2014-07-24 MED ORDER — MENTHOL 3 MG MT LOZG: 1.0000 | LOZENGE | OROMUCOSAL | Status: DC | PRN

## 2014-07-24 MED ORDER — ATORVASTATIN CALCIUM 40 MG PO TABS
40.0000 mg | ORAL_TABLET | Freq: Every day | ORAL | Status: DC
  Administered 2014-07-24 – 2014-07-25 (×2): 40 mg via ORAL
  Filled 2014-07-24 (×2): qty 1

## 2014-07-24 MED ORDER — ONDANSETRON HCL 4 MG PO TABS: 4.0000 mg | ORAL_TABLET | Freq: Four times a day (QID) | ORAL | Status: DC | PRN

## 2014-07-24 MED ORDER — PROPOFOL INFUSION 10 MG/ML OPTIME
INTRAVENOUS | Status: DC | PRN
Start: 2014-07-24 — End: 2014-07-24
  Administered 2014-07-24: 75 ug/kg/min via INTRAVENOUS

## 2014-07-24 MED ORDER — SODIUM CHLORIDE 0.9 % IR SOLN
Status: DC | PRN
  Administered 2014-07-24: 3000 mL

## 2014-07-24 MED ORDER — FENTANYL CITRATE 0.05 MG/ML IJ SOLN
INTRAMUSCULAR | Status: AC
Start: 2014-07-24 — End: 2014-07-24
  Administered 2014-07-24: 50 ug via INTRAVENOUS
  Filled 2014-07-24: qty 2

## 2014-07-24 MED ORDER — DIPHENHYDRAMINE HCL 12.5 MG/5ML PO ELIX: 12.5000 mg | ORAL_SOLUTION | ORAL | Status: DC | PRN

## 2014-07-24 MED ORDER — INSULIN ASPART 100 UNIT/ML ~~LOC~~ SOLN
0.0000 [IU] | Freq: Three times a day (TID) | SUBCUTANEOUS | Status: DC
  Administered 2014-07-24: 2 [IU] via SUBCUTANEOUS
  Administered 2014-07-25: 3 [IU] via SUBCUTANEOUS
  Administered 2014-07-25 (×2): 2 [IU] via SUBCUTANEOUS

## 2014-07-24 MED ORDER — ACETAMINOPHEN 650 MG RE SUPP: 650.0000 mg | Freq: Four times a day (QID) | RECTAL | Status: DC | PRN

## 2014-07-24 MED ORDER — OXYCODONE HCL 5 MG PO TABS
5.0000 mg | ORAL_TABLET | ORAL | Status: DC | PRN
  Administered 2014-07-24 – 2014-07-25 (×5): 10 mg via ORAL
  Filled 2014-07-24 (×5): qty 2

## 2014-07-24 MED ORDER — FLEET ENEMA 7-19 GM/118ML RE ENEM: 1.0000 | ENEMA | Freq: Once | RECTAL | Status: AC | PRN

## 2014-07-24 MED ORDER — METFORMIN HCL 500 MG PO TABS
500.0000 mg | ORAL_TABLET | Freq: Two times a day (BID) | ORAL | Status: DC
  Administered 2014-07-24 – 2014-07-25 (×3): 500 mg via ORAL
  Filled 2014-07-24 (×4): qty 1

## 2014-07-24 MED ORDER — DOCUSATE SODIUM 100 MG PO CAPS
100.0000 mg | ORAL_CAPSULE | Freq: Two times a day (BID) | ORAL | Status: DC
  Administered 2014-07-24 – 2014-07-25 (×3): 100 mg via ORAL
  Filled 2014-07-24 (×4): qty 1

## 2014-07-24 MED ORDER — ONDANSETRON HCL 4 MG/2ML IJ SOLN
4.0000 mg | Freq: Four times a day (QID) | INTRAMUSCULAR | Status: DC | PRN
  Administered 2014-07-24: 4 mg via INTRAVENOUS
  Filled 2014-07-24: qty 2

## 2014-07-24 MED ORDER — HYDROCHLOROTHIAZIDE 25 MG PO TABS
25.0000 mg | ORAL_TABLET | Freq: Every day | ORAL | Status: DC
  Administered 2014-07-25: 25 mg via ORAL
  Filled 2014-07-24 (×2): qty 1

## 2014-07-24 MED ORDER — DEXTROSE 5 % IV SOLN
500.0000 mg | Freq: Four times a day (QID) | INTRAVENOUS | Status: DC | PRN
  Filled 2014-07-24: qty 5

## 2014-07-24 MED ORDER — LACTATED RINGERS IV SOLN
INTRAVENOUS | Status: DC
  Administered 2014-07-24: 08:00:00 via INTRAVENOUS

## 2014-07-24 MED ORDER — PHENOL 1.4 % MT LIQD: 1.0000 | OROMUCOSAL | Status: DC | PRN

## 2014-07-24 MED ORDER — AMLODIPINE BESYLATE 10 MG PO TABS
10.0000 mg | ORAL_TABLET | Freq: Every day | ORAL | Status: DC
Start: 2014-07-24 — End: 2014-07-25
  Administered 2014-07-25: 10 mg via ORAL
  Filled 2014-07-24 (×2): qty 1

## 2014-07-24 MED ORDER — ROCURONIUM BROMIDE 50 MG/5ML IV SOLN
INTRAVENOUS | Status: AC
  Filled 2014-07-24: qty 1

## 2014-07-24 MED ORDER — HYDROXYZINE HCL 25 MG PO TABS: 25.0000 mg | ORAL_TABLET | Freq: Three times a day (TID) | ORAL | Status: DC | PRN

## 2014-07-24 MED ORDER — ENOXAPARIN SODIUM 30 MG/0.3ML ~~LOC~~ SOLN
30.0000 mg | Freq: Two times a day (BID) | SUBCUTANEOUS | Status: DC
  Administered 2014-07-25: 30 mg via SUBCUTANEOUS
  Filled 2014-07-24 (×3): qty 0.3

## 2014-07-24 MED ORDER — BUPIVACAINE-EPINEPHRINE (PF) 0.5% -1:200000 IJ SOLN
INTRAMUSCULAR | Status: AC
  Filled 2014-07-24: qty 30

## 2014-07-24 MED ORDER — ALUM & MAG HYDROXIDE-SIMETH 200-200-20 MG/5ML PO SUSP: 30.0000 mL | ORAL | Status: DC | PRN

## 2014-07-24 SURGICAL SUPPLY — 57 items
BANDAGE ESMARK 6X9 LF (GAUZE/BANDAGES/DRESSINGS) ×1 IMPLANT
BLADE SAGITTAL 13X1.27X60 (BLADE) ×2 IMPLANT
BLADE SAGITTAL 13X1.27X60MM (BLADE) ×1
BLADE SAW SGTL 83.5X18.5 (BLADE) ×3 IMPLANT
BLADE SURG 10 STRL SS (BLADE) ×3 IMPLANT
BNDG ESMARK 6X9 LF (GAUZE/BANDAGES/DRESSINGS) ×3
BOWL SMART MIX CTS (DISPOSABLE) ×3 IMPLANT
CAP KNEE CMT PERSONA ×3 IMPLANT
CEMENT BONE SIMPLEX SPEEDSET (Cement) ×6 IMPLANT
COVER SURGICAL LIGHT HANDLE (MISCELLANEOUS) ×3 IMPLANT
CUFF TOURNIQUET SINGLE 34IN LL (TOURNIQUET CUFF) ×3 IMPLANT
DRAPE EXTREMITY T 121X128X90 (DRAPE) ×3 IMPLANT
DRAPE IMP U-DRAPE 54X76 (DRAPES) ×3 IMPLANT
DRAPE INCISE IOBAN 66X45 STRL (DRAPES) ×6 IMPLANT
DRAPE PROXIMA HALF (DRAPES) IMPLANT
DRAPE U-SHAPE 47X51 STRL (DRAPES) ×3 IMPLANT
DRSG ADAPTIC 3X8 NADH LF (GAUZE/BANDAGES/DRESSINGS) ×3 IMPLANT
DRSG PAD ABDOMINAL 8X10 ST (GAUZE/BANDAGES/DRESSINGS) ×3 IMPLANT
DURAPREP 26ML APPLICATOR (WOUND CARE) ×6 IMPLANT
ELECT REM PT RETURN 9FT ADLT (ELECTROSURGICAL) ×3
ELECTRODE REM PT RTRN 9FT ADLT (ELECTROSURGICAL) ×1 IMPLANT
EVACUATOR 1/8 PVC DRAIN (DRAIN) ×3 IMPLANT
GAUZE SPONGE 4X4 12PLY STRL (GAUZE/BANDAGES/DRESSINGS) ×3 IMPLANT
GLOVE BIOGEL M 7.0 STRL (GLOVE) ×3 IMPLANT
GLOVE BIOGEL PI IND STRL 7.5 (GLOVE) ×1 IMPLANT
GLOVE BIOGEL PI IND STRL 8.5 (GLOVE) ×1 IMPLANT
GLOVE BIOGEL PI INDICATOR 7.5 (GLOVE) ×2
GLOVE BIOGEL PI INDICATOR 8.5 (GLOVE) ×2
GLOVE SURG ORTHO 8.0 STRL STRW (GLOVE) ×3 IMPLANT
GOWN STRL REUS W/ TWL LRG LVL3 (GOWN DISPOSABLE) ×1 IMPLANT
GOWN STRL REUS W/ TWL XL LVL3 (GOWN DISPOSABLE) ×1 IMPLANT
GOWN STRL REUS W/TWL LRG LVL3 (GOWN DISPOSABLE) ×2
GOWN STRL REUS W/TWL XL LVL3 (GOWN DISPOSABLE) ×2
HANDPIECE INTERPULSE COAX TIP (DISPOSABLE) ×2
HOOD PEEL AWAY FACE SHEILD DIS (HOOD) ×9 IMPLANT
KIT BASIN OR (CUSTOM PROCEDURE TRAY) ×3 IMPLANT
KIT ROOM TURNOVER OR (KITS) ×3 IMPLANT
MANIFOLD NEPTUNE II (INSTRUMENTS) ×3 IMPLANT
NEEDLE 22X1 1/2 (OR ONLY) (NEEDLE) ×6 IMPLANT
NS IRRIG 1000ML POUR BTL (IV SOLUTION) ×3 IMPLANT
PACK TOTAL JOINT (CUSTOM PROCEDURE TRAY) ×3 IMPLANT
PACK UNIVERSAL I (CUSTOM PROCEDURE TRAY) ×3 IMPLANT
PAD ARMBOARD 7.5X6 YLW CONV (MISCELLANEOUS) ×6 IMPLANT
PADDING CAST COTTON 6X4 STRL (CAST SUPPLIES) ×3 IMPLANT
SET HNDPC FAN SPRY TIP SCT (DISPOSABLE) ×1 IMPLANT
STAPLER VISISTAT 35W (STAPLE) IMPLANT
SUCTION FRAZIER TIP 10 FR DISP (SUCTIONS) ×3 IMPLANT
SUT BONE WAX W31G (SUTURE) ×3 IMPLANT
SUT VIC AB 0 CTB1 27 (SUTURE) ×6 IMPLANT
SUT VIC AB 1 CT1 27 (SUTURE) ×4
SUT VIC AB 1 CT1 27XBRD ANBCTR (SUTURE) ×2 IMPLANT
SUT VIC AB 2-0 CT1 27 (SUTURE) ×4
SUT VIC AB 2-0 CT1 TAPERPNT 27 (SUTURE) ×2 IMPLANT
SYR 20CC LL (SYRINGE) ×6 IMPLANT
TOWEL OR 17X24 6PK STRL BLUE (TOWEL DISPOSABLE) ×3 IMPLANT
TOWEL OR 17X26 10 PK STRL BLUE (TOWEL DISPOSABLE) ×3 IMPLANT
WATER STERILE IRR 1000ML POUR (IV SOLUTION) IMPLANT

## 2014-07-24 NOTE — Progress Notes (Signed)
Patient with pulse in the 40's and 50's upon arrival to floor.  Norvasc and HCTZ scheduled this afternoon, spoke with Carlynn Spry, PA, hold these meds until tomorrow.

## 2014-07-24 NOTE — Progress Notes (Signed)
Request for sign out requested x 3 to Dr Oletta Lamas (relief). Telephone not answered.

## 2014-07-24 NOTE — Progress Notes (Signed)
Orthopedic Tech Progress Note Patient Details:  Colleen Duran Sep 10, 1945 [id number] CPM applied to LLE with appropriate settings. OHF applied to bed. Footsie roll provided. CPM Left Knee CPM Left Knee: On Left Knee Flexion (Degrees): 90 Left Knee Extension (Degrees): 0 CPM Right Knee CPM Right Knee: On Right Knee Flexion (Degrees): 90 Right Knee Extension (Degrees): 0   Asia R Thompson 07/24/2014, 12:43 PM

## 2014-07-24 NOTE — Progress Notes (Signed)
Utilization review completed.  

## 2014-07-24 NOTE — Anesthesia Preprocedure Evaluation (Signed)
Anesthesia Evaluation  Patient identified by MRN, date of birth, ID band Patient awake    Reviewed: Allergy & Precautions, H&P , NPO status , Patient's Chart, lab work & pertinent test results, reviewed documented beta blocker date and time   Airway Mallampati: II  TM Distance: >3 FB Neck ROM: Full    Dental  (+) Teeth Intact   Pulmonary  breath sounds clear to auscultation        Cardiovascular hypertension, Pt. on medications Rhythm:Regular Rate:Normal     Neuro/Psych    GI/Hepatic   Endo/Other  diabetes, Type 2, Oral Hypoglycemic Agents  Renal/GU      Musculoskeletal  (+) Arthritis -,   Abdominal (+) + obese,   Peds  Hematology   Anesthesia Other Findings   Reproductive/Obstetrics                             Anesthesia Physical Anesthesia Plan  ASA: II  Anesthesia Plan: Regional   Post-op Pain Management: MAC Combined w/ Regional for Post-op pain   Induction:   Airway Management Planned: Simple Face Mask and Nasal Cannula  Additional Equipment:   Intra-op Plan:   Post-operative Plan:   Informed Consent: I have reviewed the patients History and Physical, chart, labs and discussed the procedure including the risks, benefits and alternatives for the proposed anesthesia with the patient or authorized representative who has indicated his/her understanding and acceptance.     Plan Discussed with: Anesthesiologist and Surgeon  Anesthesia Plan Comments:         Anesthesia Quick Evaluation

## 2014-07-24 NOTE — Anesthesia Postprocedure Evaluation (Signed)
  Anesthesia Post-op Note  Patient: Colleen Duran  Procedure(s) Performed: Procedure(s): LEFT TOTAL KNEE ARTHROPLASTY (Left)  Patient Location: PACU  Anesthesia Type:General  Level of Consciousness: awake  Airway and Oxygen Therapy: Patient Spontanous Breathing  Post-op Pain: mild  Post-op Assessment: Post-op Vital signs reviewed  Post-op Vital Signs: Reviewed  Last Vitals:  Filed Vitals:   07/24/14 1232  BP: 157/75  Pulse: 54  Temp: 36.4 C  Resp: 16    Complications: No apparent anesthesia complications

## 2014-07-24 NOTE — Anesthesia Procedure Notes (Addendum)
Spinal Patient location during procedure: OB Staffing Anesthesiologist: Alayha Babineaux, CHRIS Preanesthetic Checklist Completed: patient identified, surgical consent, pre-op evaluation, timeout performed, IV checked, risks and benefits discussed and monitors and equipment checked Spinal Block Patient position: sitting Prep: site prepped and draped and DuraPrep Patient monitoring: heart rate, cardiac monitor, continuous pulse ox and blood pressure Approach: midline Location: L4-5 Injection technique: single-shot Needle Needle type: Pencan  Needle gauge: 24 G Needle length: 10 cm Assessment Sensory level: T6  Anesthesia Regional Block:  Adductor canal block  Pre-Anesthetic Checklist: ,, timeout performed, Correct Patient, Correct Site, Correct Laterality, Correct Procedure, Correct Position, site marked, Risks and benefits discussed,  Surgical consent,  Pre-op evaluation,  At surgeon's request and post-op pain management  Laterality: Lower and Left  Prep: chloraprep       Needles:  Injection technique: Single-shot  Needle Type: Echogenic Needle          Additional Needles:  Procedures: ultrasound guided (picture in chart) Adductor canal block Narrative:  Injection made incrementally with aspirations every 5 mL.  Performed by: Personally   Additional Notes: H+P and labs reviewed, risks and benefits discussed with patient, procedure tolerated well without complications

## 2014-07-24 NOTE — Transfer of Care (Signed)
Immediate Anesthesia Transfer of Care Note  Patient: Colleen Duran  Procedure(s) Performed: Procedure(s): LEFT TOTAL KNEE ARTHROPLASTY (Left)  Patient Location: PACU  Anesthesia Type:MAC combined with regional for post-op pain  Level of Consciousness: awake, alert , oriented and patient cooperative  Airway & Oxygen Therapy: Patient Spontanous Breathing and Patient connected to nasal cannula oxygen  Post-op Assessment: Report given to PACU RN, Post -op Vital signs reviewed and stable and Patient moving all extremities  Post vital signs: Reviewed and stable  Complications: No apparent anesthesia complications

## 2014-07-24 NOTE — Evaluation (Signed)
Physical Therapy Evaluation Patient Details Name: Colleen Duran MRN: [id number] DOB: 1946/04/06 Today's Date: 07/24/2014   History of Present Illness  68 y.o. female admitted to [facility] on 07/24/14 for elective L TKA.  Pt with significant PMHx of HTN and DM.  Clinical Impression  Pt is POD #0 and is moving well, but limited by post-op nausea and vomiting after mobilizing with PT.  I anticipate that she will progress well enough to d/c home with HHPT and her daughter's assist.   PT to follow acutely for deficits listed below.       Follow Up Recommendations Home health PT;Supervision for mobility/OOB    Equipment Recommendations  None recommended by PT    Recommendations for Other Services   NA    Precautions / Restrictions Precautions Precautions: Knee Precaution Comments: reviewed no pillow under operative knee and WBAT status Restrictions Weight Bearing Restrictions: Yes LLE Weight Bearing: Weight bearing as tolerated      Mobility  Bed Mobility Overal bed mobility: Needs Assistance Bed Mobility: Supine to Sit     Supine to sit: Min assist     General bed mobility comments: Min assist to help progress left leg over EOB.   Transfers Overall transfer level: Needs assistance Equipment used: Rolling walker (2 wheeled) Transfers: Sit to/from Stand Sit to Stand: Min assist         General transfer comment: Min assist to support trunk during transitions to stand and verbal cues for safe hand palcement.   Ambulation/Gait Ambulation/Gait assistance: Min guard Ambulation Distance (Feet): 5 Feet Assistive device: Rolling walker (2 wheeled) Gait Pattern/deviations: Step-to pattern;Antalgic     General Gait Details: Pt with antalgic gait pattern, but no significant buckling over left knee during stance.  Pt relying on arms to unweight left leg due to pain.  Verbal cues for correct lower extremity sequencing and safe RW use.       Balance Overall balance assessment:  Needs assistance Sitting-balance support: Feet supported;No upper extremity supported Sitting balance-Leahy Scale: Good     Standing balance support: Bilateral upper extremity supported;Single extremity supported;No upper extremity supported Standing balance-Leahy Scale: Fair Standing balance comment: Pt able to stand statically without upper extremity support and close supervision                             Pertinent Vitals/Pain Pain Assessment: Faces Faces Pain Scale: Hurts little more Pain Location: with WB on left leg during standing and gait Pain Descriptors / Indicators: Aching Pain Intervention(s): Limited activity within patient's tolerance;Monitored during session;Repositioned    Home Living Family/patient expects to be discharged to:: Private residence Living Arrangements: Children Available Help at Discharge: Family;Available 24 hours/day (daughter off for 1 week 24/7 assist) Type of Home: House Home Access: Stairs to enter Entrance Stairs-Rails: None Entrance Stairs-Number of Steps: 1 Home Layout: One level Home Equipment: Walker - 2 wheels;Bedside commode;Shower seat      Prior Function Level of Independence: Independent with assistive device(s) (uses cane for gait at times)         Comments: pt still drives      Hand Dominance   Dominant Hand: Right    Extremity/Trunk Assessment   Upper Extremity Assessment: Defer to OT evaluation           Lower Extremity Assessment: LLE deficits/detail   LLE Deficits / Details: left leg with normal post-op pain and weakness.  Ankle 3/5, knee 2+/5, hip flexion 3-/5  Cervical / Trunk Assessment: Normal  Communication   Communication: No difficulties  Cognition Arousal/Alertness: Awake/alert Behavior During Therapy: WFL for tasks assessed/performed Overall Cognitive Status: Within Functional Limits for tasks assessed                      General Comments General comments (skin  integrity, edema, etc.): Pt became nauseated and vomited during our session.           Assessment/Plan    PT Assessment Patient needs continued PT services  PT Diagnosis Difficulty walking;Abnormality of gait;Generalized weakness;Acute pain   PT Problem List Decreased strength;Decreased range of motion;Decreased activity tolerance;Decreased balance;Decreased mobility;Decreased knowledge of use of DME;Decreased knowledge of precautions;Pain  PT Treatment Interventions DME instruction;Stair training;Gait training;Functional mobility training;Therapeutic activities;Neuromuscular re-education;Balance training;Therapeutic exercise;Patient/family education;Modalities;Manual techniques   PT Goals (Current goals can be found in the Care Plan section) Acute Rehab PT Goals Patient Stated Goal: to decrease pain and "do well" with her new knee PT Goal Formulation: With patient Time For Goal Achievement: 07/31/14 Potential to Achieve Goals: Good    Frequency 7X/week    End of Session Equipment Utilized During Treatment: Gait belt Activity Tolerance: Patient limited by pain;Other (comment) (and post-mobility nausea) Patient left: in chair;with call bell/phone within reach;with family/visitor present           Time: 6803-2122 PT Time Calculation (min) (ACUTE ONLY): 21 min   Charges:   PT Evaluation $Initial PT Evaluation Tier I: 1 Procedure PT Treatments $Therapeutic Activity: 8-22 mins        Lianni Kanaan B. Terin Cragle, PT, DPT [phone]   07/24/2014, 5:43 PM

## 2014-07-24 NOTE — H&P (Signed)
  Colleen Duran MRN:  [id number] DOB/SEX:  1945/09/18/female  CHIEF COMPLAINT:  Painful left Knee  HISTORY: Patient is a 68 y.o. female presented with a history of pain in the left knee. Onset of symptoms was gradual starting several years ago with gradually worsening course since that time. Prior procedures on the knee include none. Patient has been treated conservatively with over-the-counter NSAIDs and activity modification. Patient currently rates pain in the knee at 10 out of 10 with activity. There is pain at night.  PAST MEDICAL HISTORY: There are no active problems to display for this patient.  Past Medical History  Diagnosis Date  . Diabetes mellitus without complication   . Hypertension   . High cholesterol    Past Surgical History  Procedure Laterality Date  . Abdominal hysterectomy    . Bunionectomy      bilateral feet  . Cesarean section       MEDICATIONS:   No prescriptions prior to admission    ALLERGIES:  No Known Allergies  REVIEW OF SYSTEMS:  A comprehensive review of systems was negative.   FAMILY HISTORY:  No family history on file.  SOCIAL HISTORY:   History  Substance Use Topics  . Smoking status: Never Smoker   . Smokeless tobacco: Not on file  . Alcohol Use: No     EXAMINATION:  Vital signs in last 24 hours:    General appearance: alert, cooperative and no distress Lungs: clear to auscultation bilaterally Heart: regular rate and rhythm, S1, S2 normal, no murmur, click, rub or gallop Abdomen: soft, non-tender; bowel sounds normal; no masses,  no organomegaly Extremities: extremities normal, atraumatic, no cyanosis or edema and Homans sign is negative, no sign of DVT Pulses: 2+ and symmetric Skin: Skin color, texture, turgor normal. No rashes or lesions Neurologic: Alert and oriented X 3, normal strength and tone. Normal symmetric reflexes. Normal coordination and gait  Musculoskeletal:  ROM 0-115, Ligaments intact,  Imaging  Review Plain radiographs demonstrate severe degenerative joint disease of the left knee. The overall alignment is significant varus. The bone quality appears to be good for age and reported activity level.  Assessment/Plan: End stage arthritis, left knee   The patient history, physical examination and imaging studies are consistent with advanced degenerative joint disease of the left knee. The patient has failed conservative treatment.  The clearance notes were reviewed.  After discussion with the patient it was felt that Total Knee Replacement was indicated. The procedure,  risks, and benefits of total knee arthroplasty were presented and reviewed. The risks including but not limited to aseptic loosening, infection, blood clots, vascular injury, stiffness, patella tracking problems complications among others were discussed. The patient acknowledged the explanation, agreed to proceed with the plan.  Colleen Duran 07/24/2014, 6:41 AM

## 2014-07-25 ENCOUNTER — Encounter (HOSPITAL_COMMUNITY): Payer: Self-pay | Admitting: Orthopedic Surgery

## 2014-07-25 DIAGNOSIS — Z96659 Presence of unspecified artificial knee joint: Secondary | ICD-10-CM

## 2014-07-25 LAB — CBC
HCT: 31 % — ABNORMAL LOW (ref 36.0–46.0)
Hemoglobin: 10.2 g/dL — ABNORMAL LOW (ref 12.0–15.0)
MCH: 29.1 pg (ref 26.0–34.0)
MCHC: 32.9 g/dL (ref 30.0–36.0)
MCV: 88.6 fL (ref 78.0–100.0)
PLATELETS: 178 10*3/uL (ref 150–400)
RBC: 3.5 MIL/uL — ABNORMAL LOW (ref 3.87–5.11)
RDW: 13.3 % (ref 11.5–15.5)
WBC: 6 10*3/uL (ref 4.0–10.5)

## 2014-07-25 LAB — BASIC METABOLIC PANEL
ANION GAP: 13 (ref 5–15)
BUN: 11 mg/dL (ref 6–23)
CHLORIDE: 101 meq/L (ref 96–112)
CO2: 23 mEq/L (ref 19–32)
Calcium: 8.4 mg/dL (ref 8.4–10.5)
Creatinine, Ser: 0.91 mg/dL (ref 0.50–1.10)
GFR calc non Af Amer: 63 mL/min — ABNORMAL LOW (ref >90)
GFR, EST AFRICAN AMERICAN: 73 mL/min — AB (ref >90)
Glucose, Bld: 123 mg/dL — ABNORMAL HIGH (ref 70–99)
Potassium: 3.8 mEq/L (ref 3.7–5.3)
SODIUM: 137 meq/L (ref 137–147)

## 2014-07-25 LAB — GLUCOSE, CAPILLARY
GLUCOSE-CAPILLARY: 135 mg/dL — AB (ref 70–99)
GLUCOSE-CAPILLARY: 137 mg/dL — AB (ref 70–99)
Glucose-Capillary: 160 mg/dL — ABNORMAL HIGH (ref 70–99)

## 2014-07-25 LAB — HEMOGLOBIN A1C
Hgb A1c MFr Bld: 6.5 % — ABNORMAL HIGH (ref <5.7)
Mean Plasma Glucose: 140 mg/dL — ABNORMAL HIGH (ref <117)

## 2014-07-25 MED ORDER — OXYCODONE HCL ER 10 MG PO T12A: 10.0000 mg | EXTENDED_RELEASE_TABLET | Freq: Two times a day (BID) | ORAL | Status: DC

## 2014-07-25 MED ORDER — ENOXAPARIN SODIUM 40 MG/0.4ML ~~LOC~~ SOLN: 40.0000 mg | SUBCUTANEOUS | Status: DC

## 2014-07-25 MED ORDER — OXYCODONE HCL 5 MG PO TABS: 5.0000 mg | ORAL_TABLET | ORAL | Status: DC | PRN

## 2014-07-25 MED ORDER — METHOCARBAMOL 500 MG PO TABS: 500.0000 mg | ORAL_TABLET | Freq: Four times a day (QID) | ORAL | Status: DC | PRN

## 2014-07-25 NOTE — Evaluation (Signed)
Occupational Therapy Evaluation and Discharge Patient Details Name: Colleen Duran MRN: [id number] DOB: September 02, 1946 Today's Date: 07/25/2014    History of Present Illness 68 y.o. female admitted to [facility] on 07/24/14 for elective L TKA.  Pt with significant PMHx of HTN and DM.   Clinical Impression   This 68 yo female admitted and underwent above presents to acute OT with all education completed with pt and daughter, no further OT needs, we will sign off.    Follow Up Recommendations  No OT follow up    Equipment Recommendations  3 in 1 bedside comode       Precautions / Restrictions Precautions Precautions: Knee Restrictions Weight Bearing Restrictions: No LLE Weight Bearing: Weight bearing as tolerated      Mobility Bed Mobility Overal bed mobility: Needs Assistance Bed Mobility: Supine to Sit     Supine to sit: Modified independent (Device/Increase time);HOB elevated        Transfers Overall transfer level: Needs assistance Equipment used: Rolling walker (2 wheeled) Transfers: Sit to/from Stand Sit to Stand: Min guard         General transfer comment: VCs for safe hand placement         ADL Overall ADL's : Needs assistance/impaired Eating/Feeding: Independent;Sitting   Grooming: Wash/dry hands;Min guard;Standing   Upper Body Bathing: Set up;Sitting   Lower Body Bathing: Maximal assistance (with min guard A sit <>stand)   Upper Body Dressing : Set up;Sitting   Lower Body Dressing: Total assistance (with min guard A sit <>stand)   Toilet Transfer: Min guard;Ambulation;RW;BSC (over toilet)   Toileting- Clothing Manipulation and Hygiene: Min guard (with min guard A sit <>stand)   Tub/ Shower Transfer: Walk-in shower;Min guard;Ambulation;Rolling walker;Shower seat      Educated pt and daughter on most efficient way of getting dressed               Pertinent Vitals/Pain Pain Assessment: 0-10 Pain Score: 4  Pain Location: left knee Pain  Descriptors / Indicators: Aching Pain Intervention(s): Monitored during session;Repositioned     Hand Dominance Right   Extremity/Trunk Assessment Upper Extremity Assessment Upper Extremity Assessment: Overall WFL for tasks assessed           Communication Communication Communication: No difficulties   Cognition Arousal/Alertness: Awake/alert Behavior During Therapy: WFL for tasks assessed/performed Overall Cognitive Status: Within Functional Limits for tasks assessed                                Home Living Family/patient expects to be discharged to:: Private residence Living Arrangements: Children Available Help at Discharge: Family;Available 24 hours/day Type of Home: House Home Access: Stairs to enter [employer] of Steps: 1 Entrance Stairs-Rails: None Home Layout: One level     Bathroom Shower/Tub: Walk-in Hydrologist: Standard     Home Equipment: Environmental consultant - 2 wheels;Shower seat          Prior Functioning/Environment Level of Independence: Independent with assistive device(s)        Comments: pt still drives     OT Diagnosis: Generalized weakness;Acute pain         OT Goals(Current goals can be found in the care plan section) Acute Rehab OT Goals Patient Stated Goal: home today  OT Frequency:                End of Session Equipment Utilized During Treatment: Gait belt;Rolling walker  Activity  Tolerance: Patient tolerated treatment well Patient left: in chair;with call bell/phone within reach;with family/visitor present   Time: 0802-0827 OT Time Calculation (min): 25 min Charges:  OT General Charges $OT Visit: 1 Procedure OT Evaluation $Initial OT Evaluation Tier I: 1 Procedure OT Treatments $Self Care/Home Management : 8-22 mins  Almon Register [phone] 07/25/2014, 9:21 AM

## 2014-07-25 NOTE — Op Note (Signed)
TOTAL KNEE REPLACEMENT OPERATIVE NOTE:  07/24/2014  10:38 AM  PATIENT:  Colleen Duran  68 y.o. female  PRE-OPERATIVE DIAGNOSIS:  osteoarthritis left knee  POST-OPERATIVE DIAGNOSIS:  osteoarthritis left knee  PROCEDURE:  Procedure(s): LEFT TOTAL KNEE ARTHROPLASTY  SURGEON:  Surgeon(s): Vickey Huger, MD  PHYSICIAN ASSISTANT: Carlynn Spry, [facility]  ANESTHESIA:   spinal  DRAINS: Hemovac  SPECIMEN: None  COUNTS:  Correct  TOURNIQUET:   Total Tourniquet Time Documented: Thigh (Left) - 62 minutes Total: Thigh (Left) - 62 minutes   DICTATION:  Indication for procedure:    The patient is a 68 y.o. female who has failed conservative treatment for osteoarthritis left knee.  Informed consent was obtained prior to anesthesia. The risks versus benefits of the operation were explain and in a way the patient can, and did, understand.   On the implant demand matching protocol, this patient scored 8.  Therefore, this patient was not receive a polyethylene insert with vitamin E which is a high demand implant.  Description of procedure:     The patient was taken to the operating room and placed under anesthesia.  The patient was positioned in the usual fashion taking care that all body parts were adequately padded and/or protected.  I foley catheter was not placed.  A tourniquet was applied and the leg prepped and draped in the usual sterile fashion.  The extremity was exsanguinated with the esmarch and tourniquet inflated to 350 mmHg.  Pre-operative range of motion was normal.  The knee was in 5 degree of mild valgus.  A midline incision approximately 6-7 inches long was made with a #10 blade.  A new blade was used to make a parapatellar arthrotomy going 2-3 cm into the quadriceps tendon, over the patella, and alongside the medial aspect of the patellar tendon.  A synovectomy was then performed with the #10 blade and forceps. I then elevated the deep MCL off the medial tibial metaphysis  subperiosteally around to the semimembranosus attachment.    I everted the patella and used calipers to measure patellar thickness.  I used the reamer to ream down to appropriate thickness to recreate the native thickness.  I then removed excess bone with the rongeur and sagittal saw.  I used the appropriately sized template and drilled the three lug holes.  I then put the trial in place and measured the thickness with the calipers to ensure recreation of the native thickness.  The trial was then removed and the patella subluxed and the knee brought into flexion.  A homan retractor was place to retract and protect the patella and lateral structures.  A Z-retractor was place medially to protect the medial structures.  The extra-medullary alignment system was used to make cut the tibial articular surface perpendicular to the anamotic axis of the tibia and in 3 degrees of posterior slope.  The cut surface and alignment jig was removed.  I then used the intramedullary alignment guide to make a 4 valgus cut on the distal femur.  I then marked out the epicondylar axis on the distal femur.  The posterior condylar axis measured 3 degrees.  I then used the anterior referencing sizer and measured the femur to be a size 8.  The 4-In-1 cutting block was screwed into place in external rotation matching the posterior condylar angle, making our cuts perpendicular to the epicondylar axis.  Anterior, posterior and chamfer cuts were made with the sagittal saw.  The cutting block and cut pieces were removed.  A lamina spreader was placed in 90 degrees of flexion.  The ACL, PCL, menisci, and posterior condylar osteophytes were removed.  A 10 mm spacer blocked was found to offer good flexion and extension gap balance after minimal in degree releasing.   The scoop retractor was then placed and the femoral finishing block was pinned in place.  The small sagittal saw was used as well as the lug drill to finish the femur.  The block  and cut surfaces were removed and the medullary canal hole filled with autograft bone from the cut pieces.  The tibia was delivered forward in deep flexion and external rotation.  A size F tray was selected and pinned into place centered on the medial 1/3 of the tibial tubercle.  The reamer and keel was used to prepare the tibia through the tray.    I then trialed with the size 8 femur, size F tibia, a 10 mm insert and the 35 patella.  I had excellent flexion/extension gap balance, excellent patella tracking.  Flexion was full and beyond 120 degrees; extension was zero.  These components were chosen and the staff opened them to me on the back table while the knee was lavaged copiously and the cement mixed.  The soft tissue was infiltrated with 60cc of exparel 1.3% through a 21 gauge needle.  I cemented in the components and removed all excess cement.  The polyethylene tibial component was snapped into place and the knee placed in extension while cement was hardening.  The capsule was infilltrated with 30cc of .25% Marcaine with epinephrine.  A hemovac was place in the joint exiting superolaterally.  A pain pump was place superomedially superficial to the arthrotomy.  Once the cement was hard, the tourniquet was let down.  Hemostasis was obtained.  The arthrotomy was closed with figure-8 #1 vicryl sutures.  The deep soft tissues were closed with #0 vicryls and the subcuticular layer closed with a running #2-0 vicryl.  The skin was reapproximated and closed with skin staples.  The wound was dressed with xeroform, 4 x4's, 2 ABD sponges, a single layer of webril and a TED stocking.   The patient was then awakened, extubated, and taken to the recovery room in stable condition.  BLOOD LOSS:  300cc DRAINS: 1 hemovac, 1 pain catheter COMPLICATIONS:  None.  PLAN OF CARE: Admit to inpatient   PATIENT DISPOSITION:  PACU - hemodynamically stable.   Delay start of Pharmacological VTE agent (>24hrs) due to  surgical blood loss or risk of bleeding:  no  Please fax a copy of this op note to my office at [phone] (please only include page 1 and 2 of the Case Information op note)

## 2014-07-25 NOTE — Progress Notes (Signed)
SPORTS MEDICINE AND JOINT REPLACEMENT  Lara Mulch, MD   Carlynn Spry, PA-C [place], [place], [place]  [address]                             [phone]   PROGRESS NOTE  Subjective:  negative for Chest Pain  negative for Shortness of Breath  negative for Nausea/Vomiting   negative for Calf Pain  negative for Bowel Movement   Tolerating Diet: yes         Patient reports pain as 4 on 0-10 scale.    Objective: Vital signs in last 24 hours:   Patient Vitals for the past 24 hrs:  BP Temp Temp src Pulse Resp SpO2  07/25/14 1230 (!) 149/90 mmHg 98.4 F (36.9 C) - 81 - 96 %  07/25/14 0539 (!) 123/59 mmHg 98.9 F (37.2 C) Oral 70 - 94 %  07/25/14 0355 - - - - 16 95 %  07/25/14 0156 (!) 115/58 mmHg 99.1 F (37.3 C) Oral 74 - 95 %  07/25/14 0000 - - - - 16 100 %  07/24/14 2037 (!) 158/68 mmHg 98.7 F (37.1 C) Oral 84 - 100 %  07/24/14 2000 - - - - 16 100 %    @flow {1959:LAST@   Intake/Output from previous day:   11/16 0701 - 11/17 0700 In: 968.8 [I.V.:968.8] Out: 8416 [Urine:925; Drains:400]   Intake/Output this shift:   11/17 0701 - 11/17 1900 In: 360 [P.O.:360] Out: 325 [Drains:325]   Intake/Output      11/16 0701 - 11/17 0700 11/17 0701 - 11/18 0700   P.O.  360   I.V. (mL/kg) 968.8 (9.8)    Total Intake(mL/kg) 968.8 (9.8) 360 (3.6)   Urine (mL/kg/hr) 925 (0.4)    Drains 400 (0.2) 325 (0.3)   Blood 50 (0)    Total Output 1375 325   Net -406.3 +35        Urine Occurrence 1 x 1 x      LABORATORY DATA:  Recent Labs  07/24/14 1405 07/25/14 0528  WBC 3.9* 6.0  HGB 12.1 10.2*  HCT 35.7* 31.0*  PLT 194 178    Recent Labs  07/24/14 1405 07/25/14 0528  NA  --  137  K  --  3.8  CL  --  101  CO2  --  23  BUN  --  11  CREATININE 0.76 0.91  GLUCOSE  --  123*  CALCIUM  --  8.4   Lab Results  Component Value Date   INR 1.00 07/14/2014    Examination:  General appearance: alert, cooperative and no distress Extremities: Homans sign  is negative, no sign of DVT  Wound Exam: clean, dry, intact   Drainage:  None: wound tissue dry  Motor Exam: EHL and FHL Intact  Sensory Exam: Deep Peroneal normal   Assessment:    1 Day Post-Op  Procedure(s) (LRB): LEFT TOTAL KNEE ARTHROPLASTY (Left)  ADDITIONAL DIAGNOSIS:  Active Problems:   S/P total knee replacement using cement  Acute Blood Loss Anemia   Plan: Physical Therapy as ordered Weight Bearing as Tolerated (WBAT)  DVT Prophylaxis:  Lovenox  DISCHARGE PLAN: Home  DISCHARGE NEEDS: HHPT, CPM, Walker and 3-in-1 comode seat         Elridge Stemm 07/25/2014, 5:18 PM

## 2014-07-25 NOTE — Progress Notes (Signed)
Physical Therapy Treatment Patient Details Name: Colleen Duran MRN: [id number] DOB: 11-Oct-1945 Today's Date: 07/25/2014    History of Present Illness 68 y.o. female admitted to [facility] on 07/24/14 for elective L TKA.  Pt with significant PMHx of HTN and DM.    PT Comments    Pt progressing well. Increased gait distance noted.  PT to continue per POC.  Follow Up Recommendations  Home health PT;Supervision for mobility/OOB     Equipment Recommendations  None recommended by PT    Recommendations for Other Services       Precautions / Restrictions Precautions Precautions: Knee Restrictions Weight Bearing Restrictions: No LLE Weight Bearing: Weight bearing as tolerated    Mobility  Bed Mobility Overal bed mobility: Needs Assistance Bed Mobility: Supine to Sit     Supine to sit: Modified independent (Device/Increase time);HOB elevated     General bed mobility comments: Pt up in recliner.  Transfers Overall transfer level: Needs assistance Equipment used: Rolling walker (2 wheeled) Transfers: Sit to/from Stand Sit to Stand: Min guard         General transfer comment: VCs for safe hand placement  Ambulation/Gait Ambulation/Gait assistance: Min guard Ambulation Distance (Feet): 30 Feet Assistive device: Rolling walker (2 wheeled) Gait Pattern/deviations: Step-to pattern;Antalgic Gait velocity: decreased       Stairs            Wheelchair Mobility    Modified Rankin (Stroke Patients Only)       Balance                                    Cognition Arousal/Alertness: Awake/alert Behavior During Therapy: WFL for tasks assessed/performed Overall Cognitive Status: Within Functional Limits for tasks assessed                      Exercises Total Joint Exercises Ankle Circles/Pumps: AROM;Both;10 reps Quad Sets: AAROM;Left;10 reps Heel Slides: AAROM;Left;10 reps Hip ABduction/ADduction: AAROM;Left;10 reps Goniometric ROM:  0-50 degrees AAROM L knee     General Comments        Pertinent Vitals/Pain Pain Assessment: 0-10 Pain Score: 5  Pain Location: left knee Pain Descriptors / Indicators: Aching Pain Intervention(s): RN gave pain meds during session;Monitored during session    Home Living Family/patient expects to be discharged to:: Private residence Living Arrangements: Children Available Help at Discharge: Family;Available 24 hours/day Type of Home: House Home Access: Stairs to enter Entrance Stairs-Rails: None Home Layout: One level Home Equipment: Environmental consultant - 2 wheels;Shower seat      Prior Function Level of Independence: Independent with assistive device(s)      Comments: pt still drives    PT Goals (current goals can now be found in the care plan section) Acute Rehab PT Goals Patient Stated Goal: home today Progress towards PT goals: Progressing toward goals    Frequency  7X/week    PT Plan Current plan remains appropriate    Co-evaluation             End of Session Equipment Utilized During Treatment: Gait belt Activity Tolerance: Patient tolerated treatment well Patient left: in chair;with call bell/phone within reach     Time: 0955-1020 PT Time Calculation (min) (ACUTE ONLY): 25 min  Charges:  $Gait Training: 8-22 mins $Therapeutic Exercise: 8-22 mins                    G Codes:  Colleen Duran 07/25/2014, 11:08 AM

## 2014-07-25 NOTE — Progress Notes (Signed)
Physical Therapy Treatment Patient Details Name: Colleen Duran MRN: [id number] DOB: Mar 17, 1946 Today's Date: 07/25/2014    History of Present Illness 68 y.o. female admitted to [facility] on 07/24/14 for elective L TKA.  Pt with significant PMHx of HTN and DM.    PT Comments    Pt is POD #1 and this is her second session.  She was able to progress gait and mobility and show the ability with min guard assist and RW to get into her home by simulating a curb step.  She continues to be appropriate for HHPT at discharge.  PT will continue to follow acutely.   Follow Up Recommendations  Home health PT;Supervision for mobility/OOB     Equipment Recommendations  None recommended by PT    Recommendations for Other Services   NA     Precautions / Restrictions Precautions Precautions: Knee Restrictions LLE Weight Bearing: Weight bearing as tolerated    Mobility  Bed Mobility Overal bed mobility: Needs Assistance Bed Mobility: Supine to Sit;Sit to Supine     Supine to sit: Supervision Sit to supine: Supervision   General bed mobility comments: supervision for safety due to slow speed of transitions.  Taught pt the leg loop technique to use her strong leg to move her weak leg.   Transfers Overall transfer level: Needs assistance Equipment used: Rolling walker (2 wheeled) Transfers: Sit to/from Stand Sit to Stand: Supervision;Min assist         General transfer comment: supervision for higher surfaces with armrests, min assist from low mat table without armrests in therapy gym.  Used this to educate pt on picking smart sitting surfaces at home so that she will not struggle so much to get up or get stuck and need help to get up.  Ambulation/Gait Ambulation/Gait assistance: Supervision Ambulation Distance (Feet): 250 Feet Assistive device: Rolling walker (2 wheeled) Gait Pattern/deviations: Step-through pattern;Antalgic;Trunk flexed Gait velocity: decreased Gait velocity  interpretation: Below normal speed for age/gender General Gait Details: Verbal cues for upright posture, to glide, not pick up RW, and for good heel to toe gait pattern with lighter hands on RW.    Stairs Stairs: Yes Stairs assistance: Min guard Stair Management: Step to pattern;No rails;Forwards;With walker Number of Stairs: 1 General stair comments: Pt able to demonstrate safe technique with RW up and down one step to simulate home entry.  Pt able to verbally report back correct leg sequencing on the steps.          Balance Overall balance assessment: Needs assistance Sitting-balance support: Feet supported;No upper extremity supported Sitting balance-Leahy Scale: Good     Standing balance support: Bilateral upper extremity supported;Single extremity supported;No upper extremity supported Standing balance-Leahy Scale: Fair                      Cognition Arousal/Alertness: Awake/alert Behavior During Therapy: WFL for tasks assessed/performed Overall Cognitive Status: Within Functional Limits for tasks assessed                      Exercises Total Joint Exercises Heel Slides: AROM;AAROM;10 reps;Left;Seated Long Arc Quad: AROM;Left;10 reps;Seated        Pertinent Vitals/Pain Pain Assessment: 0-10 Pain Score: 5  Pain Location: left knee Pain Intervention(s): Limited activity within patient's tolerance;Monitored during session;Repositioned           PT Goals (current goals can now be found in the care plan section) Acute Rehab PT Goals Patient Stated Goal: home today  Progress towards PT goals: Progressing toward goals    Frequency  7X/week    PT Plan Current plan remains appropriate    End of Session Equipment Utilized During Treatment: Gait belt Activity Tolerance: Patient limited by fatigue;Patient limited by pain Patient left: in bed;in CPM;with call bell/phone within reach    Time: 1455-1526 PT Time Calculation (min) (ACUTE ONLY): 31  min  Charges:  $Gait Training: 8-22 mins $Therapeutic Exercise: 8-22 mins                     Diron Haddon B. Breyana Follansbee, PT, DPT [phone]   07/25/2014, 4:03 PM

## 2014-07-25 NOTE — Plan of Care (Signed)
Problem: Phase I Progression Outcomes Goal: CMS/Neurovascular status WDL Outcome: Completed/Met Date Met:  07/25/14 Goal: Pain controlled with appropriate interventions Outcome: Completed/Met Date Met:  07/25/14 Goal: Dangle or out of bed evening of surgery Outcome: Completed/Met Date Met:  07/25/14 Goal: Initial discharge plan identified Outcome: Completed/Met Date Met:  07/25/14 Goal: Hemodynamically stable Outcome: Completed/Met Date Met:  07/25/14 Goal: Other Phase I Outcomes/Goals Outcome: Not Applicable Date Met:  28/31/51  Problem: Phase II Progression Outcomes Goal: Ambulates Outcome: Completed/Met Date Met:  07/25/14 Goal: Tolerating diet Outcome: Completed/Met Date Met:  07/25/14 Goal: Discharge plan established Outcome: Completed/Met Date Met:  07/25/14 Goal: Other Phase II Outcomes/Goals Outcome: Not Applicable Date Met:  76/16/07  Problem: Phase III Progression Outcomes Goal: Pain controlled on oral analgesia Outcome: Completed/Met Date Met:  07/25/14 Goal: Ambulates Outcome: Completed/Met Date Met:  07/25/14 Goal: Incision clean - minimal/no drainage Outcome: Completed/Met Date Met:  07/25/14 Goal: Discharge plan remains appropriate-arrangements made Outcome: Completed/Met Date Met:  07/25/14 Goal: Anticoagulant follow-up in place Outcome: Completed/Met Date Met:  07/25/14 Goal: Other Phase III Outcomes/Goals Outcome: Not Applicable Date Met:  37/10/62

## 2014-07-25 NOTE — Discharge Instructions (Signed)
Diet: As you were doing prior to hospitalization   Activity:  Increase activity slowly as tolerated                  No lifting or driving for 6 weeks  Shower:  May shower without a dressing once there is no drainage from your wound.                 Do NOT wash over the wound.                 Dressing:  You may change your dressing on Wednesday                    Then change the dressing daily with sterile 4"x4"s gauze dressing                     And TED hose for knees.  Weight Bearing:  Weight bearing as tolerated as taught in physical therapy.  Use a                                walker or Crutches as instructed.  To prevent constipation: you may use a stool softener such as -               Colace ( over the counter) 100 mg by mouth twice a day                Drink plenty of fluids ( prune juice may be helpful) and high fiber foods                Miralax ( over the counter) for constipation as needed.    Precautions:  If you experience chest pain or shortness of breath - call 911 immediately               For transfer to the hospital emergency department!!               If you develop a fever greater that 101 F, purulent drainage from wound,                             increased redness or drainage from wound, or calf pain -- Call the office.  Follow- Up Appointment:  Please call for an appointment to be seen on 08/08/14                                              [facility] office:  [phone]            [address] [place], [place] [address]

## 2014-07-26 DIAGNOSIS — Z471 Aftercare following joint replacement surgery: Secondary | ICD-10-CM | POA: Diagnosis not present

## 2014-07-26 DIAGNOSIS — Z96652 Presence of left artificial knee joint: Secondary | ICD-10-CM | POA: Diagnosis not present

## 2014-07-26 DIAGNOSIS — I10 Essential (primary) hypertension: Secondary | ICD-10-CM | POA: Diagnosis not present

## 2014-07-26 DIAGNOSIS — Z7901 Long term (current) use of anticoagulants: Secondary | ICD-10-CM | POA: Diagnosis not present

## 2014-07-26 DIAGNOSIS — E119 Type 2 diabetes mellitus without complications: Secondary | ICD-10-CM | POA: Diagnosis not present

## 2014-07-26 DIAGNOSIS — M179 Osteoarthritis of knee, unspecified: Secondary | ICD-10-CM | POA: Diagnosis not present

## 2014-07-26 NOTE — Care Management Note (Signed)
    Page 1 of 1   07/26/2014     10:52:42 AM CARE MANAGEMENT NOTE 07/26/2014  Patient:  Colleen Duran, Colleen Duran   Account Number:  [id number]  Date Initiated:  07/24/2014  Documentation initiated by:  Ricki Miller  Subjective/Objective Assessment:   68 yr old female admitted with DJD of left knee. Patient had a left total knee arthroplasty.     Action/Plan:   PT/OT eval.  Case manager will continue to monitor.   Anticipated DC Date:  07/25/2014   Anticipated DC Plan:  [place]         [facility] Choice  HOME HEALTH   Choice offered to / List presented to:  C-1 Patient   DME arranged  3-N-1  [place]  CPM      DME agency  [employer]     [place] arranged  HH-2 PT      [place]   Status of service:  Completed, signed off Medicare Important Message given?  NA - LOS <3 / Initial given by admissions (If response is "NO", the following Medicare IM given date fields will be blank) Date Medicare IM given:   Medicare IM given by:   Date Additional Medicare IM given:   Additional Medicare IM given by:    Discharge Disposition:  [place]  Per UR Regulation:  Reviewed for med. necessity/level of care/duration of stay  If discussed at [place] of Stay Meetings, dates discussed:    Comments:

## 2014-07-27 DIAGNOSIS — E119 Type 2 diabetes mellitus without complications: Secondary | ICD-10-CM | POA: Diagnosis not present

## 2014-07-27 DIAGNOSIS — M179 Osteoarthritis of knee, unspecified: Secondary | ICD-10-CM | POA: Diagnosis not present

## 2014-07-27 DIAGNOSIS — Z96652 Presence of left artificial knee joint: Secondary | ICD-10-CM | POA: Diagnosis not present

## 2014-07-27 DIAGNOSIS — Z471 Aftercare following joint replacement surgery: Secondary | ICD-10-CM | POA: Diagnosis not present

## 2014-07-27 DIAGNOSIS — I10 Essential (primary) hypertension: Secondary | ICD-10-CM | POA: Diagnosis not present

## 2014-07-27 DIAGNOSIS — Z7901 Long term (current) use of anticoagulants: Secondary | ICD-10-CM | POA: Diagnosis not present

## 2014-07-28 DIAGNOSIS — E119 Type 2 diabetes mellitus without complications: Secondary | ICD-10-CM | POA: Diagnosis not present

## 2014-07-28 DIAGNOSIS — Z96652 Presence of left artificial knee joint: Secondary | ICD-10-CM | POA: Diagnosis not present

## 2014-07-28 DIAGNOSIS — Z7901 Long term (current) use of anticoagulants: Secondary | ICD-10-CM | POA: Diagnosis not present

## 2014-07-28 DIAGNOSIS — M179 Osteoarthritis of knee, unspecified: Secondary | ICD-10-CM | POA: Diagnosis not present

## 2014-07-28 DIAGNOSIS — I10 Essential (primary) hypertension: Secondary | ICD-10-CM | POA: Diagnosis not present

## 2014-07-28 DIAGNOSIS — Z471 Aftercare following joint replacement surgery: Secondary | ICD-10-CM | POA: Diagnosis not present

## 2014-07-31 DIAGNOSIS — E119 Type 2 diabetes mellitus without complications: Secondary | ICD-10-CM | POA: Diagnosis not present

## 2014-07-31 DIAGNOSIS — Z7901 Long term (current) use of anticoagulants: Secondary | ICD-10-CM | POA: Diagnosis not present

## 2014-07-31 DIAGNOSIS — Z471 Aftercare following joint replacement surgery: Secondary | ICD-10-CM | POA: Diagnosis not present

## 2014-07-31 DIAGNOSIS — Z96652 Presence of left artificial knee joint: Secondary | ICD-10-CM | POA: Diagnosis not present

## 2014-07-31 DIAGNOSIS — I10 Essential (primary) hypertension: Secondary | ICD-10-CM | POA: Diagnosis not present

## 2014-07-31 DIAGNOSIS — M179 Osteoarthritis of knee, unspecified: Secondary | ICD-10-CM | POA: Diagnosis not present

## 2014-08-01 DIAGNOSIS — I10 Essential (primary) hypertension: Secondary | ICD-10-CM | POA: Diagnosis not present

## 2014-08-01 DIAGNOSIS — Z7901 Long term (current) use of anticoagulants: Secondary | ICD-10-CM | POA: Diagnosis not present

## 2014-08-01 DIAGNOSIS — Z471 Aftercare following joint replacement surgery: Secondary | ICD-10-CM | POA: Diagnosis not present

## 2014-08-01 DIAGNOSIS — E119 Type 2 diabetes mellitus without complications: Secondary | ICD-10-CM | POA: Diagnosis not present

## 2014-08-01 DIAGNOSIS — M179 Osteoarthritis of knee, unspecified: Secondary | ICD-10-CM | POA: Diagnosis not present

## 2014-08-01 DIAGNOSIS — Z96652 Presence of left artificial knee joint: Secondary | ICD-10-CM | POA: Diagnosis not present

## 2014-08-02 NOTE — Discharge Summary (Signed)
SPORTS MEDICINE & JOINT REPLACEMENT   Colleen Mulch, MD   Carlynn Spry, PA-C [place], [place], [place]  [address]                             [phone]  PATIENT ID: Colleen Duran        MRN:  [id number]          DOB/AGE: July 09, 1946 / 68 y.o.    DISCHARGE SUMMARY  ADMISSION DATE:    07/24/2014 DISCHARGE DATE:   07/25/2014  ADMISSION DIAGNOSIS: osteoarthritis left knee    DISCHARGE DIAGNOSIS:  osteoarthritis left knee    ADDITIONAL DIAGNOSIS: Active Problems:   S/P total knee replacement using cement  Past Medical History  Diagnosis Date  . Hypertension   . High cholesterol   . Diabetes mellitus without complication     TYPE 2  . Arthritis     RA    PROCEDURE: Procedure(s): LEFT TOTAL KNEE ARTHROPLASTY on 07/24/2014  CONSULTS:     HISTORY:  See H&P in chart  HOSPITAL COURSE:  Colleen Duran is a 68 y.o. admitted on 07/24/2014 and found to have a diagnosis of osteoarthritis left knee.  After appropriate laboratory studies were obtained  they were taken to the operating room on 07/24/2014 and underwent Procedure(s): LEFT TOTAL KNEE ARTHROPLASTY.   They were given perioperative antibiotics:  Anti-infectives    Start     Dose/Rate Route Frequency Ordered Stop   07/24/14 1500  ceFAZolin (ANCEF) IVPB 2 g/50 mL premix     2 g100 mL/hr over 30 Minutes Intravenous Every 6 hours 07/24/14 1227 07/24/14 2115   07/24/14 0600  ceFAZolin (ANCEF) IVPB 2 g/50 mL premix     2 g100 mL/hr over 30 Minutes Intravenous On call to O.R. 07/23/14 1513 07/24/14 0917    .  Tolerated the procedure well.  Placed with a foley intraoperatively.  Given Ofirmev at induction and for 48 hours.    POD# 1: Vital signs were stable.  Patient denied Chest pain, shortness of breath, or calf pain.  Patient was started on Lovenox 30 mg subcutaneously twice daily at 8am.  Consults to PT, OT, and care management were made.  The patient was weight bearing as tolerated.  CPM was placed on the  operative leg 0-90 degrees for 6-8 hours a day.  Incentive spirometry was taught.  Dressing was changed.  Hemovac was discontinued.      POD #2, Continued  PT for ambulation and exercise program.  IV saline locked.  O2 discontinued.    The remainder of the hospital course was dedicated to ambulation and strengthening.   The patient was discharged on 1 day post op in  Good condition.  Blood products given:none  DIAGNOSTIC STUDIES: Recent vital signs: No data found.      Recent laboratory studies: No results for input(s): WBC, HGB, HCT, PLT in the last 168 hours. No results for input(s): NA, K, CL, CO2, BUN, CREATININE, GLUCOSE, CALCIUM in the last 168 hours. Lab Results  Component Value Date   INR 1.00 07/14/2014     Recent Radiographic Studies :  Dg Chest 2 View  07/14/2014   CLINICAL DATA:  Preoperative exam prior to total knee replacement; history of diabetes and hypertension; no history of tobacco use  EXAM: CHEST  2 VIEW  COMPARISON:  Portable chest x-ray of March 24, 2013.  FINDINGS: The lungs are adequately inflated. There is  no focal infiltrate. The interstitial markings are mildly increased diffusely which is a chronic finding. The cardiac silhouette is normal in size. There is mild tortuosity of the descending thoracic aorta. There is no pulmonary vascular congestion or pleural effusion or pneumothorax. The observed portions of the bony thorax exhibit no acute abnormalities.  IMPRESSION: There is no evidence of pneumonia nor CHF. Coarse interstitial markings are present bilaterally and stable.   Electronically Signed   By: David  Martinique   On: 07/14/2014 09:25    DISCHARGE INSTRUCTIONS: Discharge Instructions    CPM    Complete by:  As directed   Continuous passive motion machine (CPM):      Use the CPM from 0 to 90 for 6-8 hours per day.      You may increase by 10 per day.  You may break it up into 2 or 3 sessions per day.      Use CPM for 2 weeks or until you are told to  stop.     Call MD / Call 911    Complete by:  As directed   If you experience chest pain or shortness of breath, CALL 911 and be transported to the hospital emergency room.  If you develope a fever above 101 F, pus (white drainage) or increased drainage or redness at the wound, or calf pain, call your surgeon's office.     Change dressing    Complete by:  As directed   Change dressing on Wednesday, then change the dressing daily with sterile 4 x 4 inch gauze dressing and apply TED hose.     Constipation Prevention    Complete by:  As directed   Drink plenty of fluids.  Prune juice may be helpful.  You may use a stool softener, such as Colace (over the counter) 100 mg twice a day.  Use MiraLax (over the counter) for constipation as needed.     Diet - low sodium heart healthy    Complete by:  As directed      Do not put a pillow under the knee. Place it under the heel.    Complete by:  As directed      Driving restrictions    Complete by:  As directed   No driving for 6 weeks     Increase activity slowly as tolerated    Complete by:  As directed      Lifting restrictions    Complete by:  As directed   No lifting for 6 weeks     TED hose    Complete by:  As directed   Use stockings (TED hose) for 3 weeks on both leg(s).  You may remove them at night for sleeping.           DISCHARGE MEDICATIONS:     Medication List    TAKE these medications        ALPRAZolam 0.5 MG tablet  Commonly known as:  XANAX  Take 0.5 mg by mouth 2 (two) times daily as needed for anxiety.     amLODipine 10 MG tablet  Commonly known as:  NORVASC  Take 10 mg by mouth daily.     atorvastatin 40 MG tablet  Commonly known as:  LIPITOR  Take 40 mg by mouth daily at 6 PM.     enoxaparin 40 MG/0.4ML injection  Commonly known as:  LOVENOX  Inject 0.4 mLs (40 mg total) into the skin daily.     hydrochlorothiazide 25 MG  tablet  Commonly known as:  HYDRODIURIL  Take 25 mg by mouth daily.      hydrOXYzine 25 MG tablet  Commonly known as:  ATARAX/VISTARIL  Take 25 mg by mouth 3 (three) times daily as needed for anxiety.     labetalol 200 MG tablet  Commonly known as:  NORMODYNE  Take 400 mg by mouth 2 (two) times daily.     metFORMIN 500 MG tablet  Commonly known as:  GLUCOPHAGE  Take 500 mg by mouth 2 (two) times daily with a meal.     methocarbamol 500 MG tablet  Commonly known as:  ROBAXIN  Take 1-2 tablets (500-1,000 mg total) by mouth every 6 (six) hours as needed for muscle spasms.     multivitamin with minerals Tabs tablet  Take 1 tablet by mouth daily.     oxyCODONE 5 MG immediate release tablet  Commonly known as:  Oxy IR/ROXICODONE  Take 1-2 tablets (5-10 mg total) by mouth every 3 (three) hours as needed for breakthrough pain.     OxyCODONE 10 mg T12a 12 hr tablet  Commonly known as:  OXYCONTIN  Take 1 tablet (10 mg total) by mouth every 12 (twelve) hours.        FOLLOW UP VISIT:       Follow-up Information    Follow up with Rudean Haskell, MD. Call on 08/08/2014.   Specialty:  Orthopedic Surgery   Contact information:   [place] [address] [phone]       DISPOSITION: HOME CONDITION:  Good   Dontrel Smethers 08/02/2014, 5:29 PM

## 2014-08-04 DIAGNOSIS — Z471 Aftercare following joint replacement surgery: Secondary | ICD-10-CM | POA: Diagnosis not present

## 2014-08-04 DIAGNOSIS — I10 Essential (primary) hypertension: Secondary | ICD-10-CM | POA: Diagnosis not present

## 2014-08-04 DIAGNOSIS — E119 Type 2 diabetes mellitus without complications: Secondary | ICD-10-CM | POA: Diagnosis not present

## 2014-08-04 DIAGNOSIS — Z7901 Long term (current) use of anticoagulants: Secondary | ICD-10-CM | POA: Diagnosis not present

## 2014-08-04 DIAGNOSIS — M179 Osteoarthritis of knee, unspecified: Secondary | ICD-10-CM | POA: Diagnosis not present

## 2014-08-04 DIAGNOSIS — Z96652 Presence of left artificial knee joint: Secondary | ICD-10-CM | POA: Diagnosis not present

## 2014-08-07 DIAGNOSIS — M179 Osteoarthritis of knee, unspecified: Secondary | ICD-10-CM | POA: Diagnosis not present

## 2014-08-07 DIAGNOSIS — Z471 Aftercare following joint replacement surgery: Secondary | ICD-10-CM | POA: Diagnosis not present

## 2014-08-07 DIAGNOSIS — Z7901 Long term (current) use of anticoagulants: Secondary | ICD-10-CM | POA: Diagnosis not present

## 2014-08-07 DIAGNOSIS — E119 Type 2 diabetes mellitus without complications: Secondary | ICD-10-CM | POA: Diagnosis not present

## 2014-08-07 DIAGNOSIS — I10 Essential (primary) hypertension: Secondary | ICD-10-CM | POA: Diagnosis not present

## 2014-08-07 DIAGNOSIS — Z96652 Presence of left artificial knee joint: Secondary | ICD-10-CM | POA: Diagnosis not present

## 2014-08-08 DIAGNOSIS — Z96652 Presence of left artificial knee joint: Secondary | ICD-10-CM | POA: Diagnosis not present

## 2014-08-08 DIAGNOSIS — Z471 Aftercare following joint replacement surgery: Secondary | ICD-10-CM | POA: Diagnosis not present

## 2014-08-08 DIAGNOSIS — M25462 Effusion, left knee: Secondary | ICD-10-CM | POA: Diagnosis not present

## 2014-08-09 DIAGNOSIS — Z96652 Presence of left artificial knee joint: Secondary | ICD-10-CM | POA: Diagnosis not present

## 2014-08-09 DIAGNOSIS — E119 Type 2 diabetes mellitus without complications: Secondary | ICD-10-CM | POA: Diagnosis not present

## 2014-08-09 DIAGNOSIS — M179 Osteoarthritis of knee, unspecified: Secondary | ICD-10-CM | POA: Diagnosis not present

## 2014-08-09 DIAGNOSIS — I10 Essential (primary) hypertension: Secondary | ICD-10-CM | POA: Diagnosis not present

## 2014-08-09 DIAGNOSIS — Z7901 Long term (current) use of anticoagulants: Secondary | ICD-10-CM | POA: Diagnosis not present

## 2014-08-09 DIAGNOSIS — Z471 Aftercare following joint replacement surgery: Secondary | ICD-10-CM | POA: Diagnosis not present

## 2014-08-11 DIAGNOSIS — M6281 Muscle weakness (generalized): Secondary | ICD-10-CM | POA: Diagnosis not present

## 2014-08-11 DIAGNOSIS — Z96652 Presence of left artificial knee joint: Secondary | ICD-10-CM | POA: Diagnosis not present

## 2014-08-11 DIAGNOSIS — R262 Difficulty in walking, not elsewhere classified: Secondary | ICD-10-CM | POA: Diagnosis not present

## 2014-08-11 DIAGNOSIS — M25562 Pain in left knee: Secondary | ICD-10-CM | POA: Diagnosis not present

## 2014-08-14 DIAGNOSIS — M25562 Pain in left knee: Secondary | ICD-10-CM | POA: Diagnosis not present

## 2014-08-14 DIAGNOSIS — Z96652 Presence of left artificial knee joint: Secondary | ICD-10-CM | POA: Diagnosis not present

## 2014-08-14 DIAGNOSIS — R262 Difficulty in walking, not elsewhere classified: Secondary | ICD-10-CM | POA: Diagnosis not present

## 2014-08-14 DIAGNOSIS — M6281 Muscle weakness (generalized): Secondary | ICD-10-CM | POA: Diagnosis not present

## 2014-08-25 DIAGNOSIS — R262 Difficulty in walking, not elsewhere classified: Secondary | ICD-10-CM | POA: Diagnosis not present

## 2014-08-25 DIAGNOSIS — Z96652 Presence of left artificial knee joint: Secondary | ICD-10-CM | POA: Diagnosis not present

## 2014-08-25 DIAGNOSIS — M6281 Muscle weakness (generalized): Secondary | ICD-10-CM | POA: Diagnosis not present

## 2014-08-25 DIAGNOSIS — M25562 Pain in left knee: Secondary | ICD-10-CM | POA: Diagnosis not present

## 2014-09-04 DIAGNOSIS — K868 Other specified diseases of pancreas: Secondary | ICD-10-CM | POA: Diagnosis not present

## 2014-09-04 DIAGNOSIS — K5790 Diverticulosis of intestine, part unspecified, without perforation or abscess without bleeding: Secondary | ICD-10-CM | POA: Diagnosis not present

## 2014-09-04 DIAGNOSIS — Z90710 Acquired absence of both cervix and uterus: Secondary | ICD-10-CM | POA: Diagnosis not present

## 2014-09-04 DIAGNOSIS — R300 Dysuria: Secondary | ICD-10-CM | POA: Diagnosis not present

## 2014-09-04 DIAGNOSIS — I10 Essential (primary) hypertension: Secondary | ICD-10-CM | POA: Diagnosis not present

## 2014-09-04 DIAGNOSIS — Z79899 Other long term (current) drug therapy: Secondary | ICD-10-CM | POA: Diagnosis not present

## 2014-09-04 DIAGNOSIS — K5730 Diverticulosis of large intestine without perforation or abscess without bleeding: Secondary | ICD-10-CM | POA: Diagnosis not present

## 2014-09-04 DIAGNOSIS — E119 Type 2 diabetes mellitus without complications: Secondary | ICD-10-CM | POA: Diagnosis not present

## 2014-09-04 DIAGNOSIS — Z8249 Family history of ischemic heart disease and other diseases of the circulatory system: Secondary | ICD-10-CM | POA: Diagnosis not present

## 2014-09-04 DIAGNOSIS — E780 Pure hypercholesterolemia: Secondary | ICD-10-CM | POA: Diagnosis not present

## 2014-09-04 DIAGNOSIS — Q6101 Congenital single renal cyst: Secondary | ICD-10-CM | POA: Diagnosis not present

## 2014-09-04 DIAGNOSIS — F419 Anxiety disorder, unspecified: Secondary | ICD-10-CM | POA: Diagnosis not present

## 2014-09-04 DIAGNOSIS — R110 Nausea: Secondary | ICD-10-CM | POA: Diagnosis not present

## 2014-09-05 DIAGNOSIS — R110 Nausea: Secondary | ICD-10-CM | POA: Diagnosis not present

## 2014-09-05 DIAGNOSIS — K5730 Diverticulosis of large intestine without perforation or abscess without bleeding: Secondary | ICD-10-CM | POA: Diagnosis not present

## 2014-09-06 DIAGNOSIS — Z96652 Presence of left artificial knee joint: Secondary | ICD-10-CM | POA: Diagnosis not present

## 2014-09-06 DIAGNOSIS — M6281 Muscle weakness (generalized): Secondary | ICD-10-CM | POA: Diagnosis not present

## 2014-09-06 DIAGNOSIS — R262 Difficulty in walking, not elsewhere classified: Secondary | ICD-10-CM | POA: Diagnosis not present

## 2014-09-06 DIAGNOSIS — M25562 Pain in left knee: Secondary | ICD-10-CM | POA: Diagnosis not present

## 2014-09-13 DIAGNOSIS — M6281 Muscle weakness (generalized): Secondary | ICD-10-CM | POA: Diagnosis not present

## 2014-09-13 DIAGNOSIS — R262 Difficulty in walking, not elsewhere classified: Secondary | ICD-10-CM | POA: Diagnosis not present

## 2014-09-13 DIAGNOSIS — Z96652 Presence of left artificial knee joint: Secondary | ICD-10-CM | POA: Diagnosis not present

## 2014-09-13 DIAGNOSIS — M25562 Pain in left knee: Secondary | ICD-10-CM | POA: Diagnosis not present

## 2014-09-14 DIAGNOSIS — E784 Other hyperlipidemia: Secondary | ICD-10-CM | POA: Diagnosis not present

## 2014-09-14 DIAGNOSIS — E1165 Type 2 diabetes mellitus with hyperglycemia: Secondary | ICD-10-CM | POA: Diagnosis not present

## 2014-09-14 DIAGNOSIS — I10 Essential (primary) hypertension: Secondary | ICD-10-CM | POA: Diagnosis not present

## 2014-09-14 DIAGNOSIS — E119 Type 2 diabetes mellitus without complications: Secondary | ICD-10-CM | POA: Diagnosis not present

## 2014-09-14 DIAGNOSIS — M159 Polyosteoarthritis, unspecified: Secondary | ICD-10-CM | POA: Diagnosis not present

## 2014-09-20 DIAGNOSIS — R262 Difficulty in walking, not elsewhere classified: Secondary | ICD-10-CM | POA: Diagnosis not present

## 2014-09-20 DIAGNOSIS — Z96652 Presence of left artificial knee joint: Secondary | ICD-10-CM | POA: Diagnosis not present

## 2014-09-20 DIAGNOSIS — M25562 Pain in left knee: Secondary | ICD-10-CM | POA: Diagnosis not present

## 2014-09-20 DIAGNOSIS — M6281 Muscle weakness (generalized): Secondary | ICD-10-CM | POA: Diagnosis not present

## 2014-09-27 DIAGNOSIS — Z96652 Presence of left artificial knee joint: Secondary | ICD-10-CM | POA: Diagnosis not present

## 2014-09-27 DIAGNOSIS — M6281 Muscle weakness (generalized): Secondary | ICD-10-CM | POA: Diagnosis not present

## 2014-09-27 DIAGNOSIS — M25562 Pain in left knee: Secondary | ICD-10-CM | POA: Diagnosis not present

## 2014-09-27 DIAGNOSIS — R262 Difficulty in walking, not elsewhere classified: Secondary | ICD-10-CM | POA: Diagnosis not present

## 2014-10-03 DIAGNOSIS — Z96652 Presence of left artificial knee joint: Secondary | ICD-10-CM | POA: Diagnosis not present

## 2014-10-03 DIAGNOSIS — M6281 Muscle weakness (generalized): Secondary | ICD-10-CM | POA: Diagnosis not present

## 2014-10-03 DIAGNOSIS — M25562 Pain in left knee: Secondary | ICD-10-CM | POA: Diagnosis not present

## 2014-10-03 DIAGNOSIS — R262 Difficulty in walking, not elsewhere classified: Secondary | ICD-10-CM | POA: Diagnosis not present

## 2014-10-06 DIAGNOSIS — Z96652 Presence of left artificial knee joint: Secondary | ICD-10-CM | POA: Diagnosis not present

## 2014-10-06 DIAGNOSIS — M6281 Muscle weakness (generalized): Secondary | ICD-10-CM | POA: Diagnosis not present

## 2014-10-06 DIAGNOSIS — R262 Difficulty in walking, not elsewhere classified: Secondary | ICD-10-CM | POA: Diagnosis not present

## 2014-10-06 DIAGNOSIS — M25562 Pain in left knee: Secondary | ICD-10-CM | POA: Diagnosis not present

## 2014-10-25 DIAGNOSIS — Z96652 Presence of left artificial knee joint: Secondary | ICD-10-CM | POA: Diagnosis not present

## 2014-10-25 DIAGNOSIS — M25562 Pain in left knee: Secondary | ICD-10-CM | POA: Diagnosis not present

## 2014-10-25 DIAGNOSIS — R262 Difficulty in walking, not elsewhere classified: Secondary | ICD-10-CM | POA: Diagnosis not present

## 2014-10-25 DIAGNOSIS — M6281 Muscle weakness (generalized): Secondary | ICD-10-CM | POA: Diagnosis not present

## 2014-11-07 DIAGNOSIS — Z96652 Presence of left artificial knee joint: Secondary | ICD-10-CM | POA: Diagnosis not present

## 2014-12-14 DIAGNOSIS — E784 Other hyperlipidemia: Secondary | ICD-10-CM | POA: Diagnosis not present

## 2014-12-14 DIAGNOSIS — M159 Polyosteoarthritis, unspecified: Secondary | ICD-10-CM | POA: Diagnosis not present

## 2014-12-14 DIAGNOSIS — Z23 Encounter for immunization: Secondary | ICD-10-CM | POA: Diagnosis not present

## 2014-12-14 DIAGNOSIS — E119 Type 2 diabetes mellitus without complications: Secondary | ICD-10-CM | POA: Diagnosis not present

## 2014-12-14 DIAGNOSIS — I10 Essential (primary) hypertension: Secondary | ICD-10-CM | POA: Diagnosis not present

## 2014-12-18 DIAGNOSIS — E11319 Type 2 diabetes mellitus with unspecified diabetic retinopathy without macular edema: Secondary | ICD-10-CM | POA: Diagnosis not present

## 2014-12-18 DIAGNOSIS — H40013 Open angle with borderline findings, low risk, bilateral: Secondary | ICD-10-CM | POA: Diagnosis not present

## 2015-02-06 DIAGNOSIS — Z96659 Presence of unspecified artificial knee joint: Secondary | ICD-10-CM | POA: Insufficient documentation

## 2015-02-06 DIAGNOSIS — Z96652 Presence of left artificial knee joint: Secondary | ICD-10-CM | POA: Diagnosis not present

## 2015-03-26 DIAGNOSIS — E784 Other hyperlipidemia: Secondary | ICD-10-CM | POA: Diagnosis not present

## 2015-03-26 DIAGNOSIS — E669 Obesity, unspecified: Secondary | ICD-10-CM | POA: Diagnosis not present

## 2015-03-26 DIAGNOSIS — I10 Essential (primary) hypertension: Secondary | ICD-10-CM | POA: Diagnosis not present

## 2015-03-26 DIAGNOSIS — E119 Type 2 diabetes mellitus without complications: Secondary | ICD-10-CM | POA: Diagnosis not present

## 2015-04-23 ENCOUNTER — Telehealth: Payer: Self-pay

## 2015-04-23 NOTE — Telephone Encounter (Signed)
Pt left Vm she is ready to schedule screening colonoscopy.

## 2015-04-23 NOTE — Telephone Encounter (Signed)
Triaged pt. She will call back with med list.  Also, I will have susan check on last colonoscopy at [facility]. ( staff message sent to [place])

## 2015-04-23 NOTE — Telephone Encounter (Signed)
[phone]  PATIENT CALLED TO SCHEDULE COLONOSCOPY

## 2015-04-26 ENCOUNTER — Telehealth: Payer: Self-pay

## 2015-04-26 ENCOUNTER — Other Ambulatory Visit: Payer: Self-pay

## 2015-04-26 DIAGNOSIS — Z1211 Encounter for screening for malignant neoplasm of colon: Secondary | ICD-10-CM

## 2015-04-26 NOTE — Telephone Encounter (Signed)
See separate triage.  

## 2015-04-26 NOTE — Telephone Encounter (Signed)
Gastroenterology Pre-Procedure Review  Request Date: 04/26/2015 Requesting Physician: Dr. Legrand Rams  PATIENT REVIEW QUESTIONS: The patient responded to the following health history questions as indicated:    1. Diabetes Melitis YES 2. Joint replacements in the past 12 months: no 3. Major health problems in the past 3 months: no 4. Has an artificial valve or MVP: no 5. Has a defibrillator: no 6. Has been advised in past to take antibiotics in advance of a procedure like teeth cleaning: no    MEDICATIONS & ALLERGIES:    Patient reports the following regarding taking any blood thinners:   Plavix? no Aspirin? no Coumadin? no  Patient confirms/reports the following medications:  Current Outpatient Prescriptions  Medication Sig Dispense Refill  . acetaminophen (TYLENOL) 500 MG tablet Take 500 mg by mouth every 6 (six) hours as needed.    [name] amLODipine (NORVASC) 10 MG tablet Take 10 mg by mouth daily.     . bimatoprost (LUMIGAN) 0.03 % ophthalmic solution 1 drop at bedtime.    [name] labetalol (NORMODYNE) 200 MG tablet Take 200 mg by mouth 2 (two) times daily.     . metFORMIN (GLUCOPHAGE) 500 MG tablet Take 500 mg by mouth 2 (two) times daily with a meal.     . Multiple Vitamin (MULTIVITAMIN WITH MINERALS) TABS tablet Take 1 tablet by mouth daily.    [name] ALPRAZolam (XANAX) 0.5 MG tablet Take 0.5 mg by mouth 2 (two) times daily as needed for anxiety.     [name] atorvastatin (LIPITOR) 40 MG tablet Take 40 mg by mouth daily at 6 PM.     . enoxaparin (LOVENOX) 40 MG/0.4ML injection Inject 0.4 mLs (40 mg total) into the skin daily. (Patient not taking: Reported on 04/26/2015) 13 Syringe 0  . hydrochlorothiazide (HYDRODIURIL) 25 MG tablet Take 25 mg by mouth daily.     . hydrOXYzine (ATARAX/VISTARIL) 25 MG tablet Take 25 mg by mouth 3 (three) times daily as needed for anxiety.    . methocarbamol (ROBAXIN) 500 MG tablet Take 1-2 tablets (500-1,000 mg total) by mouth every 6 (six) hours as needed for muscle spasms.  (Patient not taking: Reported on 04/26/2015) 60 tablet 2  . oxyCODONE (OXY IR/ROXICODONE) 5 MG immediate release tablet Take 1-2 tablets (5-10 mg total) by mouth every 3 (three) hours as needed for breakthrough pain. (Patient not taking: Reported on 04/26/2015) 90 tablet 0  . OxyCODONE (OXYCONTIN) 10 mg T12A 12 hr tablet Take 1 tablet (10 mg total) by mouth every 12 (twelve) hours. (Patient not taking: Reported on 04/26/2015) 30 tablet 0   No current facility-administered medications for this visit.    Patient confirms/reports the following allergies:  No Known Allergies  No orders of the defined types were placed in this encounter.    AUTHORIZATION INFORMATION Primary Insurance:   ID #:   Group #:  Pre-Cert / Auth required: Pre-Cert / Auth #:   Secondary Insurance:   ID #:   Group #:  Pre-Cert / Auth required: Pre-Cert / Auth #:   SCHEDULE INFORMATION: Procedure has been scheduled as follows:  Date:   05/11/2015                Time: 2:15 PM Location: [facility] Short Stay  This Gastroenterology Pre-Precedure Review Form is being routed to the following provider(s): Barney Drain, MD

## 2015-04-26 NOTE — Telephone Encounter (Signed)
Daughter called with a med list for patient and was returning DS call. Please call her back at [phone]

## 2015-04-26 NOTE — Telephone Encounter (Signed)
SUPREP SPLIT DOSING- CLEAR LIQUIDS WITH BREAKFAST. CONTINUE GLUCOPHAGE.

## 2015-05-01 NOTE — Telephone Encounter (Signed)
REVIEWED-NO ADDITIONAL RECOMMENDATIONS. 

## 2015-05-01 NOTE — Addendum Note (Signed)
Addended by: Everardo All on: 05/01/2015 08:28 AM   Modules accepted: Orders

## 2015-05-01 NOTE — Telephone Encounter (Signed)
Rx sent to the pharmacy and instructions mailed to pt.  

## 2015-05-02 NOTE — Telephone Encounter (Signed)
See separate triage.  

## 2015-05-07 MED ORDER — PEG 3350-KCL-NA BICARB-NACL 420 G PO SOLR: 4000.0000 mL | ORAL | Status: DC

## 2015-05-07 NOTE — Telephone Encounter (Signed)
T/C pt has not received her prep.  Rx sent for the [facility] and instructions faxed to the pharmacy. Pt is aware.

## 2015-05-07 NOTE — Addendum Note (Signed)
Addended by: Everardo All on: 05/07/2015 12:16 PM   Modules accepted: Orders

## 2015-05-11 ENCOUNTER — Encounter (HOSPITAL_COMMUNITY): Payer: Self-pay

## 2015-05-11 ENCOUNTER — Ambulatory Visit (HOSPITAL_COMMUNITY)
Admission: RE | Admit: 2015-05-11 | Discharge: 2015-05-11 | Disposition: A | Discharge status: Home Health | Payer: Medicare Other | Source: Ambulatory Visit | Attending: Gastroenterology | Admitting: Gastroenterology

## 2015-05-11 ENCOUNTER — Encounter (HOSPITAL_COMMUNITY)
Admission: RE | Disposition: A | Discharge status: Home Health | Payer: Self-pay | Source: Ambulatory Visit | Attending: Gastroenterology

## 2015-05-11 DIAGNOSIS — E780 Pure hypercholesterolemia: Secondary | ICD-10-CM | POA: Diagnosis not present

## 2015-05-11 DIAGNOSIS — Z1211 Encounter for screening for malignant neoplasm of colon: Secondary | ICD-10-CM | POA: Diagnosis not present

## 2015-05-11 DIAGNOSIS — E119 Type 2 diabetes mellitus without complications: Secondary | ICD-10-CM | POA: Diagnosis not present

## 2015-05-11 DIAGNOSIS — D128 Benign neoplasm of rectum: Secondary | ICD-10-CM | POA: Diagnosis not present

## 2015-05-11 DIAGNOSIS — Z79899 Other long term (current) drug therapy: Secondary | ICD-10-CM | POA: Diagnosis not present

## 2015-05-11 DIAGNOSIS — Q438 Other specified congenital malformations of intestine: Secondary | ICD-10-CM | POA: Insufficient documentation

## 2015-05-11 DIAGNOSIS — Z96652 Presence of left artificial knee joint: Secondary | ICD-10-CM | POA: Insufficient documentation

## 2015-05-11 DIAGNOSIS — D122 Benign neoplasm of ascending colon: Secondary | ICD-10-CM

## 2015-05-11 DIAGNOSIS — I10 Essential (primary) hypertension: Secondary | ICD-10-CM | POA: Insufficient documentation

## 2015-05-11 DIAGNOSIS — K5730 Diverticulosis of large intestine without perforation or abscess without bleeding: Secondary | ICD-10-CM | POA: Diagnosis not present

## 2015-05-11 HISTORY — PX: COLONOSCOPY: SHX5424

## 2015-05-11 LAB — GLUCOSE, CAPILLARY: Glucose-Capillary: 106 mg/dL — ABNORMAL HIGH (ref 65–99)

## 2015-05-11 SURGERY — COLONOSCOPY
Anesthesia: Moderate Sedation

## 2015-05-11 MED ORDER — MEPERIDINE HCL 100 MG/ML IJ SOLN
INTRAMUSCULAR | Status: AC
  Filled 2015-05-11: qty 2

## 2015-05-11 MED ORDER — HYDRALAZINE HCL 20 MG/ML IJ SOLN
10.0000 mg | Freq: Once | INTRAMUSCULAR | Status: AC
  Administered 2015-05-11: 10 mg via INTRAVENOUS

## 2015-05-11 MED ORDER — MIDAZOLAM HCL 5 MG/5ML IJ SOLN
INTRAMUSCULAR | Status: DC | PRN
  Administered 2015-05-11: 1 mg via INTRAVENOUS
  Administered 2015-05-11: 2 mg via INTRAVENOUS
  Administered 2015-05-11: 1 mg via INTRAVENOUS
  Administered 2015-05-11: 2 mg via INTRAVENOUS
  Administered 2015-05-11: 1 mg via INTRAVENOUS

## 2015-05-11 MED ORDER — STERILE WATER FOR IRRIGATION IR SOLN
Status: DC | PRN
  Administered 2015-05-11: 16:00:00

## 2015-05-11 MED ORDER — MEPERIDINE HCL 100 MG/ML IJ SOLN
INTRAMUSCULAR | Status: DC | PRN
  Administered 2015-05-11 (×5): 25 mg via INTRAVENOUS

## 2015-05-11 MED ORDER — HYDRALAZINE HCL 20 MG/ML IJ SOLN
INTRAMUSCULAR | Status: AC
  Filled 2015-05-11: qty 1

## 2015-05-11 MED ORDER — MIDAZOLAM HCL 5 MG/5ML IJ SOLN
INTRAMUSCULAR | Status: AC
  Filled 2015-05-11: qty 10

## 2015-05-11 MED ORDER — SODIUM CHLORIDE 0.9 % IV SOLN
INTRAVENOUS | Status: DC
  Administered 2015-05-11: 16:00:00 via INTRAVENOUS

## 2015-05-11 NOTE — Op Note (Signed)
[facility] [address] [place], [address]   COLONOSCOPY PROCEDURE REPORT  PATIENT: Colleen Duran, Colleen Duran  MR#: [id number] BIRTHDATE: 1946/02/13 , 57  yrs. old GENDER: female ENDOSCOPIST: Danie Binder, MD REFERRED WT:UUEKCMKL Fanta, M.D. PROCEDURE DATE:  Jun 06, 2015 PROCEDURE:   Colonoscopy with cold biopsy polypectomy INDICATIONS:average risk patient for colon cancer. MEDICATIONS: Demerol 125 mg IV and Versed 7 mg IV  DESCRIPTION OF PROCEDURE:    Physical exam was performed.  Informed consent was obtained from the patient after explaining the benefits, risks, and alternatives to procedure.  The patient was connected to monitor and placed in left lateral position. Continuous oxygen was provided by nasal cannula and IV medicine administered through an indwelling cannula.  After administration of sedation and rectal exam, the patients rectum was intubated and the EC-[id number] ([id number])  colonoscope was advanced under direct visualization to the ILEOCECAL VALVE.  The scope was removed slowly by carefully examining the color, texture, anatomy, and integrity mucosa on the way out.  The patient was recovered in endoscopy and discharged home in satisfactory condition. Estimated blood loss is zero unless otherwise noted in this procedure report.     COLON FINDINGS: Two sessile polyps ranging from 2 to 70mm in size were found in the ascending colon.  A polypectomy was performed with cold forceps.  , A sessile polyp measuring 6 mm in size was found in the rectum.  A polypectomy was performed using snare cautery.  , There was moderate diverticulosis noted in the sigmoid colon and descending colon with associated angulation, tortuosity and muscular hypertrophy.  , The colon was redundant.  Manual abdominal counter-pressure was used to reach the cecum.  The patient was moved on to their back to reach the [place]. and The entire examined rectum was normal.  PREP QUALITY:  good.  WITHDRAWAL  TIME: 10       minutes COMPLICATIONS: None  ENDOSCOPIC IMPRESSION: 1.   THREE COLON polyps REMOVED 2.   Moderate diverticulosis in the sigmoid colon and descending colon 3.   The LEFT colon IS EXTREMELY redundant   RECOMMENDATIONS: FOLLOW A HIGH FIBER DIET.  AVOID ITEMS THAT CAUSE BLOATING. AWAIT BIOPSY RESULTS. Next colonoscopy in 1-3 MOS AT [facility] WITH FLUOROSCOPY/OVERTUBE IF SIMPLE ADENOMAS REMOVED.  _______________________________ Lorrin MaisDanie Binder, MD 06-06-2015 9:19 PM   CPT CODES: ICD CODES:  The ICD and CPT codes recommended by this software are interpretations from the data that the clinical staff has captured with the software.  The verification of the translation of this report to the ICD and CPT codes and modifiers is the sole responsibility of the health care institution and practicing physician where this report was generated.  [employer] will not be held responsible for the validity of the ICD and CPT codes included on this report.  AMA assumes no liability for data contained or not contained herein. CPT is a Designer, television/film set of the [employer].

## 2015-05-11 NOTE — Discharge Instructions (Signed)
You had 3 polyps removed. You have diverticulosis IN YOUR RIGHT AND LEFT COLON. YOU HAVE AN EXTREMELY FLOPPY COLON AND IT'S LIKELY DUE TO PRIOR SURGERIES. I COULD ONLY COMPLETE 99% OF THE COLONOSCOPY.   FOLLOW A HIGH FIBER DIET. AVOID ITEMS THAT CAUSE BLOATING. SEE INFO BELOW.  YOUR BIOPSY RESULTS WILL BE AVAILABLE IN MY CHART AFTER SEP 7 AND MY OFFICE WILL CONTACT YOU IN 10-14 DAYS WITH YOUR RESULTS.   Next colonoscopy in 1-3 MOS IF YOU HAVE SIMPLE ADENOMAS REMOVED.   Colonoscopy Care After Read the instructions outlined below and refer to this sheet in the next week. These discharge instructions provide you with general information on caring for yourself after you leave the hospital. While your treatment has been planned according to the most current medical practices available, unavoidable complications occasionally occur. If you have any problems or questions after discharge, call DR. Corbet Hanley, [phone].  ACTIVITY  You may resume your regular activity, but move at a slower pace for the next 24 hours.   Take frequent rest periods for the next 24 hours.   Walking will help get rid of the air and reduce the bloated feeling in your belly (abdomen).   No driving for 24 hours (because of the medicine (anesthesia) used during the test).   You may shower.   Do not sign any important legal documents or operate any machinery for 24 hours (because of the anesthesia used during the test).    NUTRITION  Drink plenty of fluids.   You may resume your normal diet as instructed by your doctor.   Begin with a light meal and progress to your normal diet. Heavy or fried foods are harder to digest and may make you feel sick to your stomach (nauseated).   Avoid alcoholic beverages for 24 hours or as instructed.    MEDICATIONS  You may resume your normal medications.   WHAT YOU CAN EXPECT TODAY  Some feelings of bloating in the abdomen.   Passage of more gas than usual.   Spotting  of blood in your stool or on the toilet paper  .  IF YOU HAD POLYPS REMOVED DURING THE COLONOSCOPY:  Eat a soft diet IF YOU HAVE NAUSEA, BLOATING, ABDOMINAL PAIN, OR VOMITING.    FINDING OUT THE RESULTS OF YOUR TEST Not all test results are available during your visit. DR. Oneida Alar WILL CALL YOU WITHIN 7 DAYS OF YOUR PROCEDUE WITH YOUR RESULTS. Do not assume everything is normal if you have not heard from DR. Sonnie Bias IN ONE WEEK, CALL HER OFFICE AT [phone].  SEEK IMMEDIATE MEDICAL ATTENTION AND CALL THE OFFICE: [phone] IF:  You have more than a spotting of blood in your stool.   Your belly is swollen (abdominal distention).   You are nauseated or vomiting.   You have a temperature over 101F.   You have abdominal pain or discomfort that is severe or gets worse throughout the day.  Polyps, Colon  A polyp is extra tissue that grows inside your body. Colon polyps grow in the large intestine. The large intestine, also called the colon, is part of your digestive system. It is a long, hollow tube at the end of your digestive tract where your body makes and stores stool. Most polyps are not dangerous. They are benign. This means they are not cancerous. But over time, some types of polyps can turn into cancer. Polyps that are smaller than a pea are usually not harmful. But larger polyps could someday  become or may already be cancerous. To be safe, doctors remove all polyps and test them.   WHO GETS POLYPS? Anyone can get polyps, but certain people are more likely than others. You may have a greater chance of getting polyps if:  You are over 50.   You have had polyps before.   Someone in your family has had polyps.   Someone in your family has had cancer of the large intestine.   Find out if someone in your family has had polyps. You may also be more likely to get polyps if you:   Eat a lot of fatty foods   Smoke   Drink alcohol   Do not exercise  Eat too much    TREATMENT  The caregiver will remove the polyp during sigmoidoscopy or colonoscopy.  PREVENTION There is not one sure way to prevent polyps. You might be able to lower your risk of getting them if you:  Eat more fruits and vegetables and less fatty food.   Do not smoke.   Avoid alcohol.   Exercise every day.   Lose weight if you are overweight.   Eating more calcium and folate can also lower your risk of getting polyps. Some foods that are rich in calcium are milk, cheese, and broccoli. Some foods that are rich in folate are chickpeas, kidney beans, and spinach.   High-Fiber Diet A high-fiber diet changes your normal diet to include more whole grains, legumes, fruits, and vegetables. Changes in the diet involve replacing refined carbohydrates with unrefined foods. The calorie level of the diet is essentially unchanged. The Dietary Reference Intake (recommended amount) for adult males is 38 grams per day. For adult females, it is 25 grams per day. Pregnant and lactating women should consume 28 grams of fiber per day. Fiber is the intact part of a plant that is not broken down during digestion. Functional fiber is fiber that has been isolated from the plant to provide a beneficial effect in the body. PURPOSE  Increase stool bulk.   Ease and regulate bowel movements.   Lower cholesterol.  INDICATIONS THAT YOU NEED MORE FIBER  Constipation and hemorrhoids.   Uncomplicated diverticulosis (intestine condition) and irritable bowel syndrome.   Weight management.   As a protective measure against hardening of the arteries (atherosclerosis), diabetes, and cancer.   GUIDELINES FOR INCREASING FIBER IN THE DIET  Start adding fiber to the diet slowly. A gradual increase of about 5 more grams (2 slices of whole-wheat bread, 2 servings of most fruits or vegetables, or 1 bowl of high-fiber cereal) per day is best. Too rapid an increase in fiber may result in constipation, flatulence, and  bloating.   Drink enough water and fluids to keep your urine clear or pale yellow. Water, juice, or caffeine-free drinks are recommended. Not drinking enough fluid may cause constipation.   Eat a variety of high-fiber foods rather than one type of fiber.   Try to increase your intake of fiber through using high-fiber foods rather than fiber pills or supplements that contain small amounts of fiber.   The goal is to change the types of food eaten. Do not supplement your present diet with high-fiber foods, but replace foods in your present diet.  INCLUDE A VARIETY OF FIBER SOURCES  Replace refined and processed grains with whole grains, canned fruits with fresh fruits, and incorporate other fiber sources. White rice, white breads, and most bakery goods contain little or no fiber.   Brown whole-grain  rice, buckwheat oats, and many fruits and vegetables are all good sources of fiber. These include: broccoli, Brussels sprouts, cabbage, cauliflower, beets, sweet potatoes, white potatoes (skin on), carrots, tomatoes, eggplant, squash, berries, fresh fruits, and dried fruits.   Cereals appear to be the richest source of fiber. Cereal fiber is found in whole grains and bran. Bran is the fiber-rich outer coat of cereal grain, which is largely removed in refining. In whole-grain cereals, the bran remains. In breakfast cereals, the largest amount of fiber is found in those with "bran" in their names. The fiber content is sometimes indicated on the label.   You may need to include additional fruits and vegetables each day.   In baking, for 1 cup white flour, you may use the following substitutions:   1 cup whole-wheat flour minus 2 tablespoons.   1/2 cup white flour plus 1/2 cup whole-wheat flour.   Diverticulosis Diverticulosis is a common condition that develops when small pouches (diverticula) form in the wall of the colon. The risk of diverticulosis increases with age. It happens more often in people  who eat a low-fiber diet. Most individuals with diverticulosis have no symptoms. Those individuals with symptoms usually experience belly (abdominal) pain, constipation, or loose stools (diarrhea).  HOME CARE INSTRUCTIONS  Increase the amount of fiber in your diet as directed by your caregiver or dietician. This may reduce symptoms of diverticulosis.   Drink at least 6 to 8 glasses of water each day to prevent constipation.   Try not to strain when you have a bowel movement.   Avoiding nuts and seeds to prevent complications is still an uncertain benefit.       FOODS HAVING HIGH FIBER CONTENT INCLUDE:  Fruits. Apple, peach, pear, tangerine, raisins, prunes.   Vegetables. Brussels sprouts, asparagus, broccoli, cabbage, carrot, cauliflower, romaine lettuce, spinach, summer squash, tomato, winter squash, zucchini.   Starchy Vegetables. Baked beans, kidney beans, lima beans, split peas, lentils, potatoes (with skin).   Grains. Whole wheat bread, brown rice, bran flake cereal, plain oatmeal, white rice, shredded wheat, bran muffins.    SEEK IMMEDIATE MEDICAL CARE IF:  You develop increasing pain or severe bloating.   You have an oral temperature above 101F.   You develop vomiting or bowel movements that are bloody or black.   Hemorrhoids Hemorrhoids are dilated (enlarged) veins around the rectum. Sometimes clots will form in the veins. This makes them swollen and painful. These are called thrombosed hemorrhoids. Causes of hemorrhoids include:  Constipation.   Straining to have a bowel movement.   HEAVY LIFTING HOME CARE INSTRUCTIONS  Eat a well balanced diet and drink 6 to 8 glasses of water every day to avoid constipation. You may also use a bulk laxative.   Avoid straining to have bowel movements.   Keep anal area dry and clean.   Do not use a donut shaped pillow or sit on the toilet for long periods. This increases blood pooling and pain.   Move your bowels when  your body has the urge; this will require less straining and will decrease pain and pressure.

## 2015-05-11 NOTE — H&P (Signed)
  Primary Care Physician:  Rosita Fire, MD Primary Gastroenterologist:  Dr. Oneida Alar  Pre-Procedure History & Physical: HPI:  Colleen Duran is a 69 y.o. female here for [place].  Past Medical History  Diagnosis Date  . Hypertension   . High cholesterol   . Diabetes mellitus without complication     TYPE 2  . Arthritis     RA    Past Surgical History  Procedure Laterality Date  . Abdominal hysterectomy    . Bunionectomy      bilateral feet  . Cesarean section    . 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 Total knee arthroplasty Left 07/24/2014    Procedure: LEFT TOTAL KNEE ARTHROPLASTY;  Surgeon: Vickey Huger, MD;  Location: [facility];  Service: Orthopedics;  Laterality: Left;    Prior to Admission medications   Medication Sig Start Date End Date Taking? Authorizing Provider  acetaminophen (TYLENOL) 500 MG tablet Take 500 mg by mouth every 6 (six) hours as needed.   Yes Historical Provider, MD  amLODipine (NORVASC) 10 MG tablet Take 10 mg by mouth daily.  09/10/13  Yes Historical Provider, MD  atorvastatin (LIPITOR) 40 MG tablet Take 40 mg by mouth daily at 6 PM.  09/10/13  Yes Historical Provider, MD  bimatoprost (LUMIGAN) 0.03 % ophthalmic solution 1 drop at bedtime.   Yes Historical Provider, MD  labetalol (NORMODYNE) 200 MG tablet Take 200 mg by mouth 2 (two) times daily.  09/10/13  Yes Historical Provider, MD  metFORMIN (GLUCOPHAGE) 500 MG tablet Take 500 mg by mouth 2 (two) times daily with a meal.  09/10/13  Yes Historical Provider, MD  Multiple Vitamin (MULTIVITAMIN WITH MINERALS) TABS tablet Take 1 tablet by mouth daily.   Yes Historical Provider, MD  polyethylene glycol-electrolytes (TRILYTE) 420 G solution Take 4,000 mLs by mouth as directed. 05/07/15  Yes Danie Binder, MD    Allergies as of 04/26/2015  . (No Known Allergies)    History reviewed. No pertinent family history.  Social History   Social History  . Marital Status: Widowed   Spouse Name: N/A  . Number of Children: N/A  . Years of Education: N/A   Occupational History  . Not on file.   Social History Main Topics  . Smoking status: Never Smoker   . Smokeless tobacco: Never Used  . Alcohol Use: No  . Drug Use: No  . Sexual Activity: Not on file   Other Topics Concern  . Not on file   Social History Narrative    Review of Systems: See HPI, otherwise negative ROS   Physical Exam: BP 188/103 mmHg  Pulse 76  Temp(Src) 98.5 F (36.9 C) (Oral)  Resp 16  Ht 5\' 5"  (1.651 m)  Wt 214 lb (97.07 kg)  BMI 35.61 kg/m2  SpO2 100% General:   Alert,  pleasant and cooperative in NAD Head:  Normocephalic and atraumatic. Neck:  Supple; Lungs:  Clear throughout to auscultation.    Heart:  Regular rate and rhythm. Abdomen:  Soft, nontender and nondistended. Normal bowel sounds, without guarding, and without rebound.   Neurologic:  Alert and  oriented x4;  grossly normal neurologically.  Impression/Plan:    SCREENING  Plan:  1. TCS TODAY

## 2015-05-11 NOTE — Progress Notes (Signed)
Per Dr. Oneida Alar patient to resume Labetalol tonight. Resume Norvasc in morning. No further instructions at this time.

## 2015-05-18 ENCOUNTER — Encounter (HOSPITAL_COMMUNITY): Payer: Self-pay | Admitting: Gastroenterology

## 2015-05-22 ENCOUNTER — Telehealth: Payer: Self-pay | Admitting: Gastroenterology

## 2015-05-22 NOTE — Telephone Encounter (Signed)
Pt and daughter Colleen Duran are aware.

## 2015-05-22 NOTE — Telephone Encounter (Signed)
Referral made 

## 2015-05-22 NOTE — Telephone Encounter (Signed)
Please call pt. She had simple adenomas removed.   FOLLOW A HIGH FIBER DIET. AVOID ITEMS THAT CAUSE BLOATING.   Next colonoscopy in 1-3 MOS AT [facility] WITH AN OVERTUBE/FLUOROSCOPY BECAUSE SHE HAD THREE SIMPLE ADENOMAS REMOVED. SHE SHOULD HAVE A COMPLETE COLONOSCOPY.

## 2015-05-23 NOTE — Telephone Encounter (Signed)
Reminder in epic °

## 2015-05-31 ENCOUNTER — Telehealth: Payer: Self-pay

## 2015-05-31 NOTE — Telephone Encounter (Signed)
Pt has appt at [facility] for TCS on 06/21/2015 @ 0730

## 2015-06-20 ENCOUNTER — Telehealth: Payer: Self-pay | Admitting: Gastroenterology

## 2015-06-20 NOTE — Telephone Encounter (Signed)
Pt has appt at [facility] for TCS on 06/21/2015 @ 0730

## 2015-06-20 NOTE — Telephone Encounter (Signed)
NOV RECALL FOR TCS AT [facility]

## 2015-06-21 DIAGNOSIS — D125 Benign neoplasm of sigmoid colon: Secondary | ICD-10-CM | POA: Diagnosis not present

## 2015-06-21 DIAGNOSIS — Z7984 Long term (current) use of oral hypoglycemic drugs: Secondary | ICD-10-CM | POA: Diagnosis not present

## 2015-06-21 DIAGNOSIS — Z79899 Other long term (current) drug therapy: Secondary | ICD-10-CM | POA: Diagnosis not present

## 2015-06-21 DIAGNOSIS — N736 Female pelvic peritoneal adhesions (postinfective): Secondary | ICD-10-CM | POA: Diagnosis not present

## 2015-06-21 DIAGNOSIS — K5730 Diverticulosis of large intestine without perforation or abscess without bleeding: Secondary | ICD-10-CM | POA: Diagnosis not present

## 2015-06-21 DIAGNOSIS — I10 Essential (primary) hypertension: Secondary | ICD-10-CM | POA: Diagnosis not present

## 2015-06-21 DIAGNOSIS — Z1211 Encounter for screening for malignant neoplasm of colon: Secondary | ICD-10-CM | POA: Diagnosis not present

## 2015-06-21 DIAGNOSIS — Z96652 Presence of left artificial knee joint: Secondary | ICD-10-CM | POA: Diagnosis not present

## 2015-06-21 DIAGNOSIS — Z86010 Personal history of colonic polyps: Secondary | ICD-10-CM | POA: Diagnosis not present

## 2015-06-21 DIAGNOSIS — E119 Type 2 diabetes mellitus without complications: Secondary | ICD-10-CM | POA: Diagnosis not present

## 2015-06-21 DIAGNOSIS — K635 Polyp of colon: Secondary | ICD-10-CM | POA: Diagnosis not present

## 2015-06-21 DIAGNOSIS — M1990 Unspecified osteoarthritis, unspecified site: Secondary | ICD-10-CM | POA: Diagnosis not present

## 2015-06-28 DIAGNOSIS — E669 Obesity, unspecified: Secondary | ICD-10-CM | POA: Diagnosis not present

## 2015-06-28 DIAGNOSIS — M159 Polyosteoarthritis, unspecified: Secondary | ICD-10-CM | POA: Diagnosis not present

## 2015-06-28 DIAGNOSIS — E1165 Type 2 diabetes mellitus with hyperglycemia: Secondary | ICD-10-CM | POA: Diagnosis not present

## 2015-06-28 DIAGNOSIS — I10 Essential (primary) hypertension: Secondary | ICD-10-CM | POA: Diagnosis not present

## 2015-07-02 ENCOUNTER — Other Ambulatory Visit (HOSPITAL_COMMUNITY): Payer: Self-pay | Admitting: Internal Medicine

## 2015-07-02 DIAGNOSIS — Z1231 Encounter for screening mammogram for malignant neoplasm of breast: Secondary | ICD-10-CM

## 2015-07-12 ENCOUNTER — Ambulatory Visit (HOSPITAL_COMMUNITY)
Admission: RE | Admit: 2015-07-12 | Discharge: 2015-07-12 | Disposition: A | Discharge status: Home / Self Care | Payer: Medicare Other | Source: Ambulatory Visit | Attending: Internal Medicine | Admitting: Internal Medicine

## 2015-07-12 DIAGNOSIS — Z1231 Encounter for screening mammogram for malignant neoplasm of breast: Secondary | ICD-10-CM | POA: Diagnosis not present

## 2015-08-28 DIAGNOSIS — Z96652 Presence of left artificial knee joint: Secondary | ICD-10-CM | POA: Diagnosis not present

## 2015-08-28 DIAGNOSIS — M25462 Effusion, left knee: Secondary | ICD-10-CM | POA: Diagnosis not present

## 2015-08-28 DIAGNOSIS — M1711 Unilateral primary osteoarthritis, right knee: Secondary | ICD-10-CM | POA: Diagnosis not present

## 2015-09-05 ENCOUNTER — Other Ambulatory Visit: Payer: Self-pay | Admitting: Orthopedic Surgery

## 2015-09-26 NOTE — Pre-Procedure Instructions (Signed)
Colleen Duran  09/26/2015      [facility] - [facility], [address] AT [address] [place] [address] Phone: [phone] Fax: [phone]    Your procedure is scheduled on Monday, October 08, 2015  Report to [facility] Admitting at 8:00 A.M.  Call this number if you have problems the morning of surgery:  [phone]   Remember:  Do not eat food or drink liquids after midnight Sunday, October 07, 2015  Take these medicines the morning of surgery with A SIP OF WATER : amLODipine (NORVASC),  labetalol (NORMODYNE), if needed: acetaminophen (TYLENOL) for pain   What do I do about my diabetes medications?   Do not take oral diabetes medicines (pills) the morning of surgery such as metFORMIN (GLUCOPHAGE)   Stop taking Aspirin, vitamins, fish oil and herbal medications. Do not take any NSAIDs ie: Ibuprofen, Advil, Naproxen, BC and Goody Powder or any medication containing Aspirin; stop Tuesday, January 24,2017    How to Manage Your Diabetes Before Surgery Why is it important to control my blood sugar before and after surgery?   Improving blood sugar levels before and after surgery helps healing and can limit problems.  A way of improving blood sugar control is eating a healthy diet by:  - Eating less sugar and carbohydrates  - Increasing activity/exercise  - Talk with your doctor about reaching your blood sugar goals  High blood sugars (greater than 180 mg/dL) can raise your risk of infections and slow down your recovery so you will need to focus on controlling your diabetes during the weeks before surgery.  Make sure that the doctor who takes care of your diabetes knows about your planned surgery including the date and location.  How do I manage my blood sugars before surgery?   Check your blood sugar at least 4 times a day, 2 days before surgery to make sure that they are not too high  or low.   Check your blood sugar the morning of your surgery when you wake up and every 2 hours until you get to the Short-Stay unit.  If your blood sugar is less than 70 mg/dL, you will need to treat for low blood sugar by:  Treat a low blood sugar (less than 70 mg/dL) with 1/2 cup of clear juice (cranberry or apple), 4 glucose tablets, OR glucose gel.  Recheck blood sugar in 15 minutes after treatment (to make sure it is greater than 70 mg/dL).  If blood sugar is not greater than 70 mg/dL on re-check, call [phone] for further instructions.   Report your blood sugar to the Short-Stay nurse when you get to Short-Stay.  References:  [facility], 2007 "How to Manage your Diabetes Before and After Surgery".     Do not wear jewelry, make-up or nail polish.  Do not wear lotions, powders, or perfumes.  You may wear deodorant.  Do not shave 48 hours prior to surgery.   Do not bring valuables to the hospital.  [facility] is not responsible for any belongings or valuables.  Contacts, dentures or bridgework may not be worn into surgery.  Leave your suitcase in the car.  After surgery it may be brought to your room.  For patients admitted to the hospital, discharge time will be determined by your treatment team.  Patients discharged the day of surgery will not be allowed to drive home.  Name and phone number of your driver:    Special instructions: Shower the night before surgery and the morning of surgery with CHG.  Please read over the following fact sheets that you were given. Pain Booklet, Coughing and Deep Breathing, MRSA Information and Surgical Site Infection Prevention

## 2015-09-27 ENCOUNTER — Encounter (HOSPITAL_COMMUNITY): Payer: Self-pay

## 2015-09-27 ENCOUNTER — Encounter (HOSPITAL_COMMUNITY)
Admission: RE | Admit: 2015-09-27 | Discharge: 2015-09-27 | Disposition: A | Discharge status: Home / Self Care | Payer: Medicare HMO | Source: Ambulatory Visit | Attending: Orthopedic Surgery | Admitting: Orthopedic Surgery

## 2015-09-27 DIAGNOSIS — Z01812 Encounter for preprocedural laboratory examination: Secondary | ICD-10-CM | POA: Insufficient documentation

## 2015-09-27 DIAGNOSIS — Z01818 Encounter for other preprocedural examination: Secondary | ICD-10-CM | POA: Diagnosis not present

## 2015-09-27 DIAGNOSIS — E119 Type 2 diabetes mellitus without complications: Secondary | ICD-10-CM | POA: Insufficient documentation

## 2015-09-27 DIAGNOSIS — M069 Rheumatoid arthritis, unspecified: Secondary | ICD-10-CM | POA: Insufficient documentation

## 2015-09-27 DIAGNOSIS — M1711 Unilateral primary osteoarthritis, right knee: Secondary | ICD-10-CM | POA: Diagnosis not present

## 2015-09-27 DIAGNOSIS — Z96652 Presence of left artificial knee joint: Secondary | ICD-10-CM | POA: Diagnosis not present

## 2015-09-27 DIAGNOSIS — I10 Essential (primary) hypertension: Secondary | ICD-10-CM | POA: Insufficient documentation

## 2015-09-27 DIAGNOSIS — E7800 Pure hypercholesterolemia, unspecified: Secondary | ICD-10-CM | POA: Insufficient documentation

## 2015-09-27 DIAGNOSIS — I440 Atrioventricular block, first degree: Secondary | ICD-10-CM | POA: Diagnosis not present

## 2015-09-27 DIAGNOSIS — Z7984 Long term (current) use of oral hypoglycemic drugs: Secondary | ICD-10-CM | POA: Insufficient documentation

## 2015-09-27 DIAGNOSIS — Z79899 Other long term (current) drug therapy: Secondary | ICD-10-CM | POA: Diagnosis not present

## 2015-09-27 HISTORY — DX: Presence of spectacles and contact lenses: Z97.3

## 2015-09-27 LAB — GLUCOSE, CAPILLARY: GLUCOSE-CAPILLARY: 119 mg/dL — AB (ref 65–99)

## 2015-09-27 LAB — URINALYSIS, ROUTINE W REFLEX MICROSCOPIC
BILIRUBIN URINE: NEGATIVE
GLUCOSE, UA: NEGATIVE mg/dL
HGB URINE DIPSTICK: NEGATIVE
Ketones, ur: NEGATIVE mg/dL
Nitrite: NEGATIVE
PROTEIN: NEGATIVE mg/dL
SPECIFIC GRAVITY, URINE: 1.007 (ref 1.005–1.030)
pH: 6.5 (ref 5.0–8.0)

## 2015-09-27 LAB — CBC WITH DIFFERENTIAL/PLATELET
Basophils Absolute: 0 10*3/uL (ref 0.0–0.1)
Basophils Relative: 0 %
EOS ABS: 0.2 10*3/uL (ref 0.0–0.7)
EOS PCT: 5 %
HCT: 36.6 % (ref 36.0–46.0)
Hemoglobin: 12.3 g/dL (ref 12.0–15.0)
LYMPHS ABS: 1.6 10*3/uL (ref 0.7–4.0)
Lymphocytes Relative: 38 %
MCH: 30.1 pg (ref 26.0–34.0)
MCHC: 33.6 g/dL (ref 30.0–36.0)
MCV: 89.7 fL (ref 78.0–100.0)
Monocytes Absolute: 0.3 10*3/uL (ref 0.1–1.0)
Monocytes Relative: 7 %
Neutro Abs: 2.2 10*3/uL (ref 1.7–7.7)
Neutrophils Relative %: 50 %
Platelets: 197 10*3/uL (ref 150–400)
RBC: 4.08 MIL/uL (ref 3.87–5.11)
RDW: 13.2 % (ref 11.5–15.5)
WBC: 4.3 10*3/uL (ref 4.0–10.5)

## 2015-09-27 LAB — URINE MICROSCOPIC-ADD ON: RBC / HPF: NONE SEEN RBC/hpf (ref 0–5)

## 2015-09-27 LAB — COMPREHENSIVE METABOLIC PANEL
ALK PHOS: 76 U/L (ref 38–126)
ALT: 14 U/L (ref 14–54)
ANION GAP: 10 (ref 5–15)
AST: 20 U/L (ref 15–41)
Albumin: 4.2 g/dL (ref 3.5–5.0)
BILIRUBIN TOTAL: 0.8 mg/dL (ref 0.3–1.2)
BUN: 11 mg/dL (ref 6–20)
CALCIUM: 9.6 mg/dL (ref 8.9–10.3)
CO2: 24 mmol/L (ref 22–32)
CREATININE: 0.85 mg/dL (ref 0.44–1.00)
Chloride: 108 mmol/L (ref 101–111)
GFR calc non Af Amer: >60 mL/min (ref >60)
Glucose, Bld: 113 mg/dL — ABNORMAL HIGH (ref 65–99)
Potassium: 3.6 mmol/L (ref 3.5–5.1)
Sodium: 142 mmol/L (ref 135–145)
TOTAL PROTEIN: 8.2 g/dL — AB (ref 6.5–8.1)

## 2015-09-27 LAB — APTT: APTT: 38 s — AB (ref 24–37)

## 2015-09-27 LAB — SURGICAL PCR SCREEN
MRSA, PCR: NEGATIVE
Staphylococcus aureus: NEGATIVE

## 2015-09-27 LAB — PROTIME-INR
INR: 1.03 (ref 0.00–1.49)
Prothrombin Time: 13.7 seconds (ref 11.6–15.2)

## 2015-09-27 NOTE — Progress Notes (Signed)
PCP - Dr. Legrand Rams Cardiologist - denies  EKG - 09/27/15 CXR - 09/27/15  Echo/stress test/cardiac cath - denies  Patient denies chest pain and shortness of breath at PAT appointment.  Patient's blood pressure elevated at PAT appointment but patient states she had not taken her blood pressure medicines today.    Patient reports checking her blood sugar twice a day and that fasting blood sugar is usually around 120.  Patient educated on importance of checking blood sugar 4 times a day, 2 days prior to surgery.

## 2015-09-28 LAB — HEMOGLOBIN A1C
HEMOGLOBIN A1C: 6.3 % — AB (ref 4.8–5.6)
MEAN PLASMA GLUCOSE: 134 mg/dL

## 2015-09-28 LAB — URINE CULTURE

## 2015-09-28 NOTE — Progress Notes (Signed)
Anesthesia Chart Review: Patient is a 70 year old female scheduled for right TKA on 10/08/2015 by Dr. Lorre Nick.  History includes  nonsmoker, hypertension, hypercholesterolemia, diabetes mellitus type 2, rheumatoid arthritis, glasses, hysterectomy, left TKA '15. BMI is consistent with obesity. PCP is Dr. Rosita Fire.  Meds include amlodipine, Lipitor, labetalol, metformin, Lumigan.  09/27/2015 EKG: SR, first degree AVB, moderate voltage criteria for LVH, non-specific T wave abnormality. Motion in V4-6.  09/27/2015 CXR: Impression: No active cardiopulmonary disease.  Preoperative labs noted. A1c 6.3. Urine culture showed multiple species present, suggest recollection.  If no acute changes then I would anticipate that she could proceed as planned.  George Hugh [facility] Short Stay Center/Anesthesiology Phone [phone] 09/28/2015 1:47 PM

## 2015-10-04 DIAGNOSIS — Z6831 Body mass index (BMI) 31.0-31.9, adult: Secondary | ICD-10-CM | POA: Diagnosis not present

## 2015-10-04 DIAGNOSIS — M170 Bilateral primary osteoarthritis of knee: Secondary | ICD-10-CM | POA: Diagnosis not present

## 2015-10-04 DIAGNOSIS — I10 Essential (primary) hypertension: Secondary | ICD-10-CM | POA: Diagnosis not present

## 2015-10-04 DIAGNOSIS — E119 Type 2 diabetes mellitus without complications: Secondary | ICD-10-CM | POA: Diagnosis not present

## 2015-10-05 MED ORDER — CHLORHEXIDINE GLUCONATE 4 % EX LIQD: 60.0000 mL | Freq: Once | CUTANEOUS | Status: DC

## 2015-10-05 MED ORDER — SODIUM CHLORIDE 0.9 % IV SOLN: INTRAVENOUS | Status: DC

## 2015-10-05 MED ORDER — CEFAZOLIN SODIUM-DEXTROSE 2-3 GM-% IV SOLR
2.0000 g | INTRAVENOUS | Status: AC
  Administered 2015-10-08: 2 g via INTRAVENOUS

## 2015-10-05 MED ORDER — TRANEXAMIC ACID 1000 MG/10ML IV SOLN
1000.0000 mg | INTRAVENOUS | Status: AC
  Administered 2015-10-08: 1000 mg via INTRAVENOUS
  Filled 2015-10-05: qty 10

## 2015-10-05 MED ORDER — BUPIVACAINE LIPOSOME 1.3 % IJ SUSP
20.0000 mL | Freq: Once | INTRAMUSCULAR | Status: AC
  Administered 2015-10-08: 20 mL
  Filled 2015-10-05: qty 20

## 2015-10-08 ENCOUNTER — Inpatient Hospital Stay (HOSPITAL_COMMUNITY)
Admission: RE | Admit: 2015-10-08 | Discharge: 2015-10-09 | DRG: 470 | Disposition: A | Discharge status: Home / Self Care | Payer: Medicare HMO | Source: Ambulatory Visit | Attending: Orthopedic Surgery | Admitting: Orthopedic Surgery

## 2015-10-08 ENCOUNTER — Encounter (HOSPITAL_COMMUNITY)
Admission: RE | Disposition: A | Discharge status: Home / Self Care | Payer: Self-pay | Source: Ambulatory Visit | Attending: Orthopedic Surgery

## 2015-10-08 ENCOUNTER — Inpatient Hospital Stay (HOSPITAL_COMMUNITY): Payer: Medicare HMO | Admitting: Vascular Surgery

## 2015-10-08 ENCOUNTER — Inpatient Hospital Stay (HOSPITAL_COMMUNITY): Payer: Medicare HMO | Admitting: Anesthesiology

## 2015-10-08 DIAGNOSIS — E7800 Pure hypercholesterolemia, unspecified: Secondary | ICD-10-CM | POA: Diagnosis present

## 2015-10-08 DIAGNOSIS — E119 Type 2 diabetes mellitus without complications: Secondary | ICD-10-CM | POA: Diagnosis not present

## 2015-10-08 DIAGNOSIS — D62 Acute posthemorrhagic anemia: Secondary | ICD-10-CM | POA: Diagnosis not present

## 2015-10-08 DIAGNOSIS — M069 Rheumatoid arthritis, unspecified: Secondary | ICD-10-CM | POA: Diagnosis present

## 2015-10-08 DIAGNOSIS — Z96652 Presence of left artificial knee joint: Secondary | ICD-10-CM | POA: Diagnosis present

## 2015-10-08 DIAGNOSIS — Z96659 Presence of unspecified artificial knee joint: Secondary | ICD-10-CM

## 2015-10-08 DIAGNOSIS — Z7984 Long term (current) use of oral hypoglycemic drugs: Secondary | ICD-10-CM | POA: Diagnosis not present

## 2015-10-08 DIAGNOSIS — M179 Osteoarthritis of knee, unspecified: Secondary | ICD-10-CM | POA: Diagnosis not present

## 2015-10-08 DIAGNOSIS — M1711 Unilateral primary osteoarthritis, right knee: Principal | ICD-10-CM | POA: Diagnosis present

## 2015-10-08 DIAGNOSIS — I10 Essential (primary) hypertension: Secondary | ICD-10-CM | POA: Diagnosis not present

## 2015-10-08 DIAGNOSIS — M25761 Osteophyte, right knee: Secondary | ICD-10-CM | POA: Diagnosis present

## 2015-10-08 HISTORY — PX: TOTAL KNEE ARTHROPLASTY: SHX125

## 2015-10-08 LAB — GLUCOSE, CAPILLARY
GLUCOSE-CAPILLARY: 118 mg/dL — AB (ref 65–99)
GLUCOSE-CAPILLARY: 147 mg/dL — AB (ref 65–99)
GLUCOSE-CAPILLARY: 107 mg/dL — AB (ref 65–99)
Glucose-Capillary: 124 mg/dL — ABNORMAL HIGH (ref 65–99)

## 2015-10-08 LAB — CBC
HEMATOCRIT: 44.7 % (ref 36.0–46.0)
Hemoglobin: 15.5 g/dL — ABNORMAL HIGH (ref 12.0–15.0)
MCH: 30.5 pg (ref 26.0–34.0)
MCHC: 34.7 g/dL (ref 30.0–36.0)
MCV: 88 fL (ref 78.0–100.0)
PLATELETS: 142 10*3/uL — AB (ref 150–400)
RBC: 5.08 MIL/uL (ref 3.87–5.11)
RDW: 13.1 % (ref 11.5–15.5)
WBC: 6.5 10*3/uL (ref 4.0–10.5)

## 2015-10-08 LAB — CREATININE, SERUM
Creatinine, Ser: 0.74 mg/dL (ref 0.44–1.00)
GFR calc non Af Amer: >60 mL/min (ref >60)

## 2015-10-08 SURGERY — ARTHROPLASTY, KNEE, TOTAL
Anesthesia: Monitor Anesthesia Care | Site: Knee | Laterality: Right

## 2015-10-08 MED ORDER — AMLODIPINE BESYLATE 10 MG PO TABS
10.0000 mg | ORAL_TABLET | Freq: Every day | ORAL | Status: DC
  Administered 2015-10-09: 10 mg via ORAL
  Filled 2015-10-08: qty 1

## 2015-10-08 MED ORDER — LABETALOL HCL 200 MG PO TABS
200.0000 mg | ORAL_TABLET | Freq: Two times a day (BID) | ORAL | Status: DC
  Administered 2015-10-08 – 2015-10-09 (×2): 200 mg via ORAL
  Filled 2015-10-08 (×4): qty 1

## 2015-10-08 MED ORDER — DOCUSATE SODIUM 100 MG PO CAPS
100.0000 mg | ORAL_CAPSULE | Freq: Two times a day (BID) | ORAL | Status: DC
  Administered 2015-10-08 – 2015-10-09 (×3): 100 mg via ORAL
  Filled 2015-10-08 (×3): qty 1

## 2015-10-08 MED ORDER — SENNOSIDES-DOCUSATE SODIUM 8.6-50 MG PO TABS: 1.0000 | ORAL_TABLET | Freq: Every evening | ORAL | Status: DC | PRN

## 2015-10-08 MED ORDER — FENTANYL CITRATE (PF) 250 MCG/5ML IJ SOLN
INTRAMUSCULAR | Status: DC | PRN
  Administered 2015-10-08 (×7): 25 ug via INTRAVENOUS
  Administered 2015-10-08: 50 ug via INTRAVENOUS
  Administered 2015-10-08 (×3): 25 ug via INTRAVENOUS

## 2015-10-08 MED ORDER — PHENOL 1.4 % MT LIQD: 1.0000 | OROMUCOSAL | Status: DC | PRN

## 2015-10-08 MED ORDER — FENTANYL CITRATE (PF) 250 MCG/5ML IJ SOLN
INTRAMUSCULAR | Status: AC
  Filled 2015-10-08: qty 5

## 2015-10-08 MED ORDER — OXYCODONE HCL ER 10 MG PO T12A
10.0000 mg | EXTENDED_RELEASE_TABLET | Freq: Two times a day (BID) | ORAL | Status: DC
  Administered 2015-10-08 – 2015-10-09 (×3): 10 mg via ORAL
  Filled 2015-10-08 (×3): qty 1

## 2015-10-08 MED ORDER — OXYCODONE HCL 5 MG PO TABS
ORAL_TABLET | ORAL | Status: AC
  Administered 2015-10-08: 10 mg via ORAL
  Filled 2015-10-08: qty 2

## 2015-10-08 MED ORDER — METOCLOPRAMIDE HCL 5 MG/ML IJ SOLN
5.0000 mg | Freq: Three times a day (TID) | INTRAMUSCULAR | Status: DC | PRN
Start: 2015-10-08 — End: 2015-10-09

## 2015-10-08 MED ORDER — PROPOFOL 10 MG/ML IV BOLUS
INTRAVENOUS | Status: AC
  Filled 2015-10-08: qty 20

## 2015-10-08 MED ORDER — ONDANSETRON HCL 4 MG PO TABS
4.0000 mg | ORAL_TABLET | Freq: Four times a day (QID) | ORAL | Status: DC | PRN
  Administered 2015-10-08: 4 mg via ORAL
  Filled 2015-10-08: qty 1

## 2015-10-08 MED ORDER — ACETAMINOPHEN 650 MG RE SUPP: 650.0000 mg | Freq: Four times a day (QID) | RECTAL | Status: DC | PRN

## 2015-10-08 MED ORDER — ENOXAPARIN SODIUM 30 MG/0.3ML ~~LOC~~ SOLN
30.0000 mg | Freq: Two times a day (BID) | SUBCUTANEOUS | Status: DC
  Administered 2015-10-09: 30 mg via SUBCUTANEOUS
  Filled 2015-10-08: qty 0.3

## 2015-10-08 MED ORDER — ALUM & MAG HYDROXIDE-SIMETH 200-200-20 MG/5ML PO SUSP: 30.0000 mL | ORAL | Status: DC | PRN

## 2015-10-08 MED ORDER — BUPIVACAINE IN DEXTROSE 0.75-8.25 % IT SOLN
INTRATHECAL | Status: DC | PRN
  Administered 2015-10-08: 15 mg via INTRATHECAL

## 2015-10-08 MED ORDER — BUPIVACAINE-EPINEPHRINE (PF) 0.25% -1:200000 IJ SOLN
INTRAMUSCULAR | Status: AC
  Filled 2015-10-08: qty 30

## 2015-10-08 MED ORDER — HYDROMORPHONE HCL 1 MG/ML IJ SOLN
INTRAMUSCULAR | Status: AC
  Administered 2015-10-08: 0.5 mg via INTRAVENOUS
  Filled 2015-10-08: qty 1

## 2015-10-08 MED ORDER — SODIUM CHLORIDE 0.9 % IR SOLN
Status: DC | PRN
  Administered 2015-10-08: 1000 mL

## 2015-10-08 MED ORDER — METHOCARBAMOL 1000 MG/10ML IJ SOLN: 500.0000 mg | Freq: Four times a day (QID) | INTRAVENOUS | Status: DC | PRN

## 2015-10-08 MED ORDER — ONDANSETRON HCL 4 MG/2ML IJ SOLN: 4.0000 mg | Freq: Four times a day (QID) | INTRAMUSCULAR | Status: DC | PRN

## 2015-10-08 MED ORDER — MIDAZOLAM HCL 2 MG/2ML IJ SOLN
INTRAMUSCULAR | Status: DC | PRN
  Administered 2015-10-08: 2 mg via INTRAVENOUS

## 2015-10-08 MED ORDER — MIDAZOLAM HCL 2 MG/2ML IJ SOLN
INTRAMUSCULAR | Status: AC
  Filled 2015-10-08: qty 2

## 2015-10-08 MED ORDER — HYDROMORPHONE HCL 1 MG/ML IJ SOLN
0.2500 mg | INTRAMUSCULAR | Status: DC | PRN
  Administered 2015-10-08 (×4): 0.5 mg via INTRAVENOUS

## 2015-10-08 MED ORDER — SODIUM CHLORIDE 0.9 % IJ SOLN
INTRAMUSCULAR | Status: DC | PRN
  Administered 2015-10-08: 20 mL

## 2015-10-08 MED ORDER — ATORVASTATIN CALCIUM 40 MG PO TABS
40.0000 mg | ORAL_TABLET | Freq: Every day | ORAL | Status: DC
  Administered 2015-10-08: 40 mg via ORAL
  Filled 2015-10-08: qty 1

## 2015-10-08 MED ORDER — METHOCARBAMOL 500 MG PO TABS
500.0000 mg | ORAL_TABLET | Freq: Four times a day (QID) | ORAL | Status: DC | PRN
  Administered 2015-10-09: 500 mg via ORAL
  Filled 2015-10-08: qty 1

## 2015-10-08 MED ORDER — PROMETHAZINE HCL 25 MG/ML IJ SOLN: 6.2500 mg | INTRAMUSCULAR | Status: DC | PRN

## 2015-10-08 MED ORDER — MENTHOL 3 MG MT LOZG: 1.0000 | LOZENGE | OROMUCOSAL | Status: DC | PRN

## 2015-10-08 MED ORDER — CELECOXIB 200 MG PO CAPS
200.0000 mg | ORAL_CAPSULE | Freq: Two times a day (BID) | ORAL | Status: DC
  Administered 2015-10-08 – 2015-10-09 (×3): 200 mg via ORAL
  Filled 2015-10-08 (×3): qty 1

## 2015-10-08 MED ORDER — HYDROMORPHONE HCL 1 MG/ML IJ SOLN: 1.0000 mg | INTRAMUSCULAR | Status: DC | PRN

## 2015-10-08 MED ORDER — ZOLPIDEM TARTRATE 5 MG PO TABS: 5.0000 mg | ORAL_TABLET | Freq: Every evening | ORAL | Status: DC | PRN

## 2015-10-08 MED ORDER — PROPOFOL 500 MG/50ML IV EMUL
INTRAVENOUS | Status: DC | PRN
  Administered 2015-10-08: 50 ug/kg/min via INTRAVENOUS

## 2015-10-08 MED ORDER — DIPHENHYDRAMINE HCL 12.5 MG/5ML PO ELIX: 12.5000 mg | ORAL_SOLUTION | ORAL | Status: DC | PRN

## 2015-10-08 MED ORDER — BISACODYL 5 MG PO TBEC: 5.0000 mg | DELAYED_RELEASE_TABLET | Freq: Every day | ORAL | Status: DC | PRN

## 2015-10-08 MED ORDER — BUPIVACAINE-EPINEPHRINE (PF) 0.25% -1:200000 IJ SOLN
INTRAMUSCULAR | Status: DC | PRN
  Administered 2015-10-08: 20 mL via PERINEURAL

## 2015-10-08 MED ORDER — SODIUM CHLORIDE 0.9 % IV SOLN
1000.0000 mg | Freq: Once | INTRAVENOUS | Status: AC
  Administered 2015-10-08: 1000 mg via INTRAVENOUS
  Filled 2015-10-08: qty 10

## 2015-10-08 MED ORDER — SODIUM CHLORIDE 0.9 % IV SOLN
INTRAVENOUS | Status: DC
  Administered 2015-10-08: 17:00:00 via INTRAVENOUS
  Administered 2015-10-09: 75 mL/h via INTRAVENOUS

## 2015-10-08 MED ORDER — FLEET ENEMA 7-19 GM/118ML RE ENEM: 1.0000 | ENEMA | Freq: Once | RECTAL | Status: DC | PRN

## 2015-10-08 MED ORDER — LACTATED RINGERS IV SOLN
INTRAVENOUS | Status: DC
  Administered 2015-10-08 (×2): via INTRAVENOUS

## 2015-10-08 MED ORDER — PROPOFOL 10 MG/ML IV BOLUS
INTRAVENOUS | Status: DC | PRN
  Administered 2015-10-08: 20 mg via INTRAVENOUS

## 2015-10-08 MED ORDER — INSULIN ASPART 100 UNIT/ML ~~LOC~~ SOLN
0.0000 [IU] | Freq: Three times a day (TID) | SUBCUTANEOUS | Status: DC
  Administered 2015-10-08: 2 [IU] via SUBCUTANEOUS

## 2015-10-08 MED ORDER — OXYCODONE HCL 5 MG PO TABS
5.0000 mg | ORAL_TABLET | ORAL | Status: DC | PRN
  Administered 2015-10-08 – 2015-10-09 (×2): 10 mg via ORAL
  Filled 2015-10-08 (×2): qty 2

## 2015-10-08 MED ORDER — ACETAMINOPHEN 325 MG PO TABS: 650.0000 mg | ORAL_TABLET | Freq: Four times a day (QID) | ORAL | Status: DC | PRN

## 2015-10-08 MED ORDER — CEFAZOLIN SODIUM-DEXTROSE 2-3 GM-% IV SOLR
2.0000 g | Freq: Four times a day (QID) | INTRAVENOUS | Status: AC
  Administered 2015-10-08 (×2): 2 g via INTRAVENOUS
  Filled 2015-10-08 (×2): qty 50

## 2015-10-08 MED ORDER — METOCLOPRAMIDE HCL 5 MG PO TABS: 5.0000 mg | ORAL_TABLET | Freq: Three times a day (TID) | ORAL | Status: DC | PRN

## 2015-10-08 MED ORDER — METFORMIN HCL 500 MG PO TABS
500.0000 mg | ORAL_TABLET | Freq: Two times a day (BID) | ORAL | Status: DC
  Administered 2015-10-08 – 2015-10-09 (×2): 500 mg via ORAL
  Filled 2015-10-08 (×2): qty 1

## 2015-10-08 SURGICAL SUPPLY — 59 items
BANDAGE ESMARK 6X9 LF (GAUZE/BANDAGES/DRESSINGS) ×1 IMPLANT
BLADE SAGITTAL 13X1.27X60 (BLADE) ×2 IMPLANT
BLADE SAGITTAL 13X1.27X60MM (BLADE) ×1
BLADE SAW SGTL 83.5X18.5 (BLADE) ×3 IMPLANT
BLADE SURG 10 STRL SS (BLADE) ×3 IMPLANT
BNDG ESMARK 6X9 LF (GAUZE/BANDAGES/DRESSINGS) ×3
BOWL SMART MIX CTS (DISPOSABLE) ×3 IMPLANT
CAPT KNEE TOTAL 3 ×3 IMPLANT
CEMENT BONE SIMPLEX SPEEDSET (Cement) ×3 IMPLANT
COVER SURGICAL LIGHT HANDLE (MISCELLANEOUS) ×3 IMPLANT
CUFF TOURNIQUET SINGLE 34IN LL (TOURNIQUET CUFF) ×3 IMPLANT
DRAPE EXTREMITY T 121X128X90 (DRAPE) ×3 IMPLANT
DRAPE INCISE IOBAN 66X45 STRL (DRAPES) ×6 IMPLANT
DRAPE PROXIMA HALF (DRAPES) IMPLANT
DRAPE U-SHAPE 47X51 STRL (DRAPES) ×3 IMPLANT
DRSG ADAPTIC 3X8 NADH LF (GAUZE/BANDAGES/DRESSINGS) ×3 IMPLANT
DRSG PAD ABDOMINAL 8X10 ST (GAUZE/BANDAGES/DRESSINGS) ×3 IMPLANT
DURAPREP 26ML APPLICATOR (WOUND CARE) ×6 IMPLANT
ELECT REM PT RETURN 9FT ADLT (ELECTROSURGICAL) ×3
ELECTRODE REM PT RTRN 9FT ADLT (ELECTROSURGICAL) ×1 IMPLANT
GAUZE SPONGE 4X4 12PLY STRL (GAUZE/BANDAGES/DRESSINGS) ×3 IMPLANT
GLOVE BIOGEL M 7.0 STRL (GLOVE) IMPLANT
GLOVE BIOGEL PI IND STRL 7.5 (GLOVE) IMPLANT
GLOVE BIOGEL PI IND STRL 8.5 (GLOVE) ×5 IMPLANT
GLOVE BIOGEL PI INDICATOR 7.5 (GLOVE)
GLOVE BIOGEL PI INDICATOR 8.5 (GLOVE) ×10
GLOVE SURG ORTHO 8.0 STRL STRW (GLOVE) ×18 IMPLANT
GOWN STRL REUS W/ TWL LRG LVL3 (GOWN DISPOSABLE) ×1 IMPLANT
GOWN STRL REUS W/ TWL XL LVL3 (GOWN DISPOSABLE) ×2 IMPLANT
GOWN STRL REUS W/TWL 2XL LVL3 (GOWN DISPOSABLE) ×3 IMPLANT
GOWN STRL REUS W/TWL LRG LVL3 (GOWN DISPOSABLE) ×2
GOWN STRL REUS W/TWL XL LVL3 (GOWN DISPOSABLE) ×4
HANDPIECE INTERPULSE COAX TIP (DISPOSABLE) ×2
HOOD PEEL AWAY FACE SHEILD DIS (HOOD) ×12 IMPLANT
KIT BASIN OR (CUSTOM PROCEDURE TRAY) ×3 IMPLANT
KIT ROOM TURNOVER OR (KITS) ×3 IMPLANT
KNEE CAPITATED TOTAL 3 ×1 IMPLANT
MANIFOLD NEPTUNE II (INSTRUMENTS) ×3 IMPLANT
NEEDLE 22X1 1/2 (OR ONLY) (NEEDLE) ×6 IMPLANT
NS IRRIG 1000ML POUR BTL (IV SOLUTION) ×3 IMPLANT
PACK TOTAL JOINT (CUSTOM PROCEDURE TRAY) ×3 IMPLANT
PACK UNIVERSAL I (CUSTOM PROCEDURE TRAY) ×3 IMPLANT
PAD ARMBOARD 7.5X6 YLW CONV (MISCELLANEOUS) ×6 IMPLANT
PADDING CAST COTTON 6X4 STRL (CAST SUPPLIES) ×3 IMPLANT
SET HNDPC FAN SPRY TIP SCT (DISPOSABLE) ×1 IMPLANT
STAPLER VISISTAT 35W (STAPLE) ×3 IMPLANT
SUCTION FRAZIER HANDLE 10FR (MISCELLANEOUS) ×2
SUCTION TUBE FRAZIER 10FR DISP (MISCELLANEOUS) ×1 IMPLANT
SUT BONE WAX W31G (SUTURE) ×3 IMPLANT
SUT VIC AB 0 CTB1 27 (SUTURE) ×6 IMPLANT
SUT VIC AB 1 CT1 27 (SUTURE) ×8
SUT VIC AB 1 CT1 27XBRD ANBCTR (SUTURE) ×4 IMPLANT
SUT VIC AB 2-0 CT1 27 (SUTURE) ×4
SUT VIC AB 2-0 CT1 TAPERPNT 27 (SUTURE) ×2 IMPLANT
SYR 20CC LL (SYRINGE) ×6 IMPLANT
TOWEL OR 17X24 6PK STRL BLUE (TOWEL DISPOSABLE) ×3 IMPLANT
TOWEL OR 17X26 10 PK STRL BLUE (TOWEL DISPOSABLE) ×3 IMPLANT
WATER STERILE IRR 1000ML POUR (IV SOLUTION) ×6 IMPLANT
YANKAUER SUCT BULB TIP NO VENT (SUCTIONS) ×3 IMPLANT

## 2015-10-08 NOTE — Evaluation (Signed)
Physical Therapy Evaluation Patient Details Name: Colleen Duran MRN: [id number] DOB: 09/26/1945 Today's Date: 10/08/2015   History of Present Illness  Admitted for RTKA;  has a past medical history of Hypertension; High cholesterol; Diabetes mellitus without complication ([place]); Arthritis; and Wears glasses.  has past surgical history that includes Abdominal hysterectomy; Bunionectomy; Cesarean section; Total knee arthroplasty (Left, 07/24/2014)  Clinical Impression   Pt is s/p TKA resulting in the deficits listed below (see PT Problem List).  Pt will benefit from skilled PT to increase their independence and safety with mobility to allow discharge to the venue listed below.      Follow Up Recommendations Home health PT    Equipment Recommendations  Rolling walker with 5" wheels;3in1 (PT) (may already have)    Recommendations for Other Services OT consult     Precautions / Restrictions Precautions Precautions: Knee Precaution Comments: Pt educated to not allow any pillow or bolster under knee for healing with optimal range of motion.  Restrictions Weight Bearing Restrictions: Yes RLE Weight Bearing: Weight bearing as tolerated      Mobility  Bed Mobility Overal bed mobility: Needs Assistance Bed Mobility: Supine to Sit     Supine to sit: Min assist     General bed mobility comments: Cues for technique; min assist to pull to sit  Transfers Overall transfer level: Needs assistance Equipment used: Rolling walker (2 wheeled) Transfers: Sit to/from Stand Sit to Stand: Min assist         General transfer comment: Cues for hand placement and safety; min assist for safety  Ambulation/Gait Ambulation/Gait assistance: Min assist Ambulation Distance (Feet):  (Pivot steps bed to [facility] to recliner) Assistive device: Rolling walker (2 wheeled) Gait Pattern/deviations: Decreased stance time - right     General Gait Details: Cues for sequence and to activate R quad for stance  stability  Stairs            Wheelchair Mobility    Modified Rankin (Stroke Patients Only)       Balance                                             Pertinent Vitals/Pain Pain Assessment: 0-10 Pain Score: 8  Pain Location: R knee Pain Descriptors / Indicators: Aching;Grimacing Pain Intervention(s): Limited activity within patient's tolerance;Monitored during session;Repositioned    Home Living Family/patient expects to be discharged to:: Private residence Living Arrangements: Children Available Help at Discharge: Family (they are pulling to gether to arrange for 24 hour assist) Type of Home: House Home Access: Stairs to enter Entrance Stairs-Rails: None Entrance Stairs-Number of Steps: 1 Home Layout: One level Home Equipment:  (to be determined)      Prior Function Level of Independence: Independent               Hand Dominance        Extremity/Trunk Assessment   Upper Extremity Assessment: Overall WFL for tasks assessed           Lower Extremity Assessment: RLE deficits/detail RLE Deficits / Details: Grossly decr aROM and strength, limited by pain postop       Communication   Communication: No difficulties  Cognition Arousal/Alertness: Awake/alert Behavior During Therapy: WFL for tasks assessed/performed Overall Cognitive Status: Within Functional Limits for tasks assessed  General Comments General comments (skin integrity, edema, etc.): Pt reproted nausea initially upon getting up; + emesis, reports feeling better after throwing up; RN aware    Exercises        Assessment/Plan    PT Assessment Patient needs continued PT services  PT Diagnosis Difficulty walking;Acute pain   PT Problem List Decreased strength;Decreased range of motion;Decreased activity tolerance;Decreased balance;Decreased mobility;Decreased knowledge of use of DME;Pain;Decreased knowledge of precautions  PT  Treatment Interventions DME instruction;Gait training;Stair training;Functional mobility training;Therapeutic activities;Therapeutic exercise;Patient/family education   PT Goals (Current goals can be found in the Care Plan section) Acute Rehab PT Goals Patient Stated Goal: less pain with walking PT Goal Formulation: With patient Time For Goal Achievement: 10/15/15 Potential to Achieve Goals: Good    Frequency 7X/week   Barriers to discharge Decreased caregiver support May not have 24/7 assist at home; family reports they are working on it    Co-evaluation               End of Session Equipment Utilized During Treatment: Gait belt Activity Tolerance: Patient tolerated treatment well Patient left: in chair;with call bell/phone within reach Nurse Communication: Mobility status         Time: [id number] PT Time Calculation (min) (ACUTE ONLY): 25 min   Charges:   PT Evaluation $PT Eval Moderate Complexity: 1 Procedure PT Treatments $Therapeutic Activity: 8-22 mins   PT G Codes:        Quin Hoop 10/08/2015, 4:17 PM  Roney Marion, [place] Pager [phone] Office [phone]

## 2015-10-08 NOTE — Transfer of Care (Signed)
Immediate Anesthesia Transfer of Care Note  Patient: Colleen Duran  Procedure(s) Performed: Procedure(s): RIGHT TOTAL KNEE ARTHROPLASTY (Right)  Patient Location: PACU  Anesthesia Type:MAC combined with regional for post-op pain  Level of Consciousness: awake, alert , oriented and patient cooperative  Airway & Oxygen Therapy: Patient Spontanous Breathing  Post-op Assessment: Report given to RN and Post -op Vital signs reviewed and stable  Post vital signs: Reviewed and stable  Last Vitals:  Filed Vitals:   10/08/15 0802  BP: 178/88  Pulse: 66  Temp: 37.3 C  Resp: 18    Complications: No apparent anesthesia complications

## 2015-10-08 NOTE — Anesthesia Procedure Notes (Signed)
Spinal Patient location during procedure: OR Start time: 10/08/2015 9:43 AM End time: 10/08/2015 9:58 AM Staffing Anesthesiologist: Duane Boston Performed by: anesthesiologist  Preanesthetic Checklist Completed: patient identified, surgical consent, pre-op evaluation, timeout performed, IV checked, risks and benefits discussed and monitors and equipment checked Spinal Block Patient position: sitting Prep: DuraPrep Patient monitoring: cardiac monitor, continuous pulse ox and blood pressure Approach: midline Location: L3-4 Injection technique: single-shot Needle Needle type: Pencan  Needle gauge: 24 G Needle length: 9 cm Additional Notes Functioning IV was confirmed and monitors were applied. Sterile prep and drape, including hand hygiene and sterile gloves were used. The patient was positioned and the spine was prepped. The skin was anesthetized with lidocaine.  Free flow of clear CSF was obtained prior to injecting local anesthetic into the CSF.  The spinal needle aspirated freely following injection.  The needle was carefully withdrawn.  The patient tolerated the procedure well.

## 2015-10-08 NOTE — H&P (Signed)
  Colleen Duran MRN:  [id number] DOB/SEX:  [birth year]/female  CHIEF COMPLAINT:  Painful right Knee  HISTORY: Patient is a 70 y.o. female presented with a history of pain in the right knee. Onset of symptoms was gradual starting several years ago with gradually worsening course since that time. Prior procedures on the knee include arthroscopy. Patient has been treated conservatively with over-the-counter NSAIDs and activity modification. Patient currently rates pain in the knee at 10 out of 10 with activity. There is pain at night.  PAST MEDICAL HISTORY: Patient Active Problem List   Diagnosis Date Noted  . Special screening for malignant neoplasms, colon   . 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 Hypertension   . High cholesterol   . Diabetes mellitus without complication ([place])     TYPE 2  . Arthritis     RA  . Wears glasses    Past Surgical History  Procedure Laterality Date  . Abdominal hysterectomy    . Bunionectomy      bilateral feet  . Cesarean section    . 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 Total knee arthroplasty Left 07/24/2014    Procedure: LEFT TOTAL KNEE ARTHROPLASTY;  Surgeon: Colleen Huger, MD;  Location: [place];  Service: Orthopedics;  Laterality: Left;  . Colonoscopy N/A 05/11/2015    Procedure: COLONOSCOPY;  Surgeon: Colleen Binder, MD;  Location[facility];  Service: Endoscopy;  Laterality: N/A;  2:15 PM     MEDICATIONS:   No prescriptions prior to admission    ALLERGIES:  No Known Allergies  REVIEW OF SYSTEMS:  Pertinent items noted in HPI and remainder of comprehensive ROS otherwise negative.   FAMILY HISTORY:  No family history on file.  SOCIAL HISTORY:   Social History  Substance Use Topics  . Smoking status: Never Smoker   . Smokeless tobacco: Never Used  . Alcohol Use: No     EXAMINATION:  Vital signs in last 24 hours:    General appearance: alert, cooperative  and no distress Lungs: clear to auscultation bilaterally Heart: regular rate and rhythm, S1, S2 normal, no murmur, click, rub or gallop Abdomen: soft, non-tender; bowel sounds normal; no masses,  no organomegaly Extremities: extremities normal, atraumatic, no cyanosis or edema and Homans sign is negative, no sign of DVT Pulses: 2+ and symmetric Skin: Skin color, texture, turgor normal. No rashes or lesions Neurologic: Alert and oriented X 3, normal strength and tone. Normal symmetric reflexes. Normal coordination and gait  Musculoskeletal:  ROM 0-105, Ligaments intact,  Imaging Review Plain radiographs demonstrate severe degenerative joint disease of the right knee. The overall alignment is significant varus. The bone quality appears to be good for age and reported activity level.  Assessment/Plan: Primary osteoarthritis, right knee   The patient history, physical examination and imaging studies are consistent with advanced degenerative joint disease of the right knee. The patient has failed conservative treatment.  The clearance notes were reviewed.  After discussion with the patient it was felt that Total Knee Replacement was indicated. The procedure,  risks, and benefits of total knee arthroplasty were presented and reviewed. The risks including but not limited to aseptic loosening, infection, blood clots, vascular injury, stiffness, patella tracking problems complications among others were discussed. The patient acknowledged the explanation, agreed to proceed with the plan.  Johnatha Duran 10/08/2015, 6:31 AM

## 2015-10-08 NOTE — Anesthesia Preprocedure Evaluation (Addendum)
Anesthesia Evaluation  Patient identified by MRN, date of birth, ID band Patient awake    Reviewed: Allergy & Precautions, NPO status , Patient's Chart, lab work & pertinent test results, reviewed documented beta blocker date and time   History of Anesthesia Complications Negative for: history of anesthetic complications  Airway Mallampati: II  TM Distance: >3 FB Neck ROM: Full    Dental  (+) Dental Advisory Given, Partial Upper, Partial Lower   Pulmonary neg pulmonary ROS,    Pulmonary exam normal        Cardiovascular hypertension, Pt. on home beta blockers Normal cardiovascular exam     Neuro/Psych negative neurological ROS  negative psych ROS   GI/Hepatic negative GI ROS, Neg liver ROS,   Endo/Other  diabetes  Renal/GU negative Renal ROS     Musculoskeletal   Abdominal   Peds  Hematology   Anesthesia Other Findings   Reproductive/Obstetrics                            Anesthesia Physical Anesthesia Plan  ASA: III  Anesthesia Plan: MAC and Spinal   Post-op Pain Management:    Induction:   Airway Management Planned: Simple Face Mask  Additional Equipment:   Intra-op Plan:   Post-operative Plan:   Informed Consent: I have reviewed the patients History and Physical, chart, labs and discussed the procedure including the risks, benefits and alternatives for the proposed anesthesia with the patient or authorized representative who has indicated his/her understanding and acceptance.   Dental advisory given  Plan Discussed with: CRNA, Anesthesiologist and Surgeon  Anesthesia Plan Comments:         Anesthesia Quick Evaluation

## 2015-10-08 NOTE — Progress Notes (Signed)
Patient alert and oriented with family at bedside. Patient is eating lunch and states her pain is ok currently.

## 2015-10-08 NOTE — Anesthesia Postprocedure Evaluation (Signed)
Anesthesia Post Note  Patient: Colleen Duran  Procedure(s) Performed: Procedure(s) (LRB): RIGHT TOTAL KNEE ARTHROPLASTY (Right)  Patient location during evaluation: PACU Anesthesia Type: Spinal and MAC Level of consciousness: awake and alert Pain management: pain level controlled Vital Signs Assessment: post-procedure vital signs reviewed and stable Respiratory status: spontaneous breathing and respiratory function stable Cardiovascular status: blood pressure returned to baseline and stable Postop Assessment: spinal receding Anesthetic complications: no    Last Vitals:  Filed Vitals:   10/08/15 1300 10/08/15 1315  BP: 130/83 140/80  Pulse: 48 50  Temp:    Resp: 15 13    Last Pain: There were no vitals filed for this visit.               Nuno Brubacher DANIEL

## 2015-10-08 NOTE — Progress Notes (Signed)
Utilization review completed.  

## 2015-10-09 ENCOUNTER — Encounter (HOSPITAL_COMMUNITY): Payer: Self-pay | Admitting: Orthopedic Surgery

## 2015-10-09 LAB — BASIC METABOLIC PANEL
ANION GAP: 9 (ref 5–15)
BUN: 9 mg/dL (ref 6–20)
CO2: 23 mmol/L (ref 22–32)
Calcium: 8.5 mg/dL — ABNORMAL LOW (ref 8.9–10.3)
Chloride: 105 mmol/L (ref 101–111)
Creatinine, Ser: 0.86 mg/dL (ref 0.44–1.00)
GLUCOSE: 133 mg/dL — AB (ref 65–99)
POTASSIUM: 3.5 mmol/L (ref 3.5–5.1)
Sodium: 137 mmol/L (ref 135–145)

## 2015-10-09 LAB — CBC
HEMATOCRIT: 31.6 % — AB (ref 36.0–46.0)
HEMOGLOBIN: 11 g/dL — AB (ref 12.0–15.0)
MCH: 30.6 pg (ref 26.0–34.0)
MCHC: 34.8 g/dL (ref 30.0–36.0)
MCV: 88 fL (ref 78.0–100.0)
Platelets: 173 10*3/uL (ref 150–400)
RBC: 3.59 MIL/uL — AB (ref 3.87–5.11)
RDW: 13.1 % (ref 11.5–15.5)
WBC: 6.9 10*3/uL (ref 4.0–10.5)

## 2015-10-09 LAB — GLUCOSE, CAPILLARY
GLUCOSE-CAPILLARY: 135 mg/dL — AB (ref 65–99)
Glucose-Capillary: 169 mg/dL — ABNORMAL HIGH (ref 65–99)

## 2015-10-09 LAB — URINE CULTURE

## 2015-10-09 LAB — HEMOGLOBIN A1C
Hgb A1c MFr Bld: 6.2 % — ABNORMAL HIGH (ref 4.8–5.6)
MEAN PLASMA GLUCOSE: 131 mg/dL

## 2015-10-09 MED ORDER — METHOCARBAMOL 500 MG PO TABS: 500.0000 mg | ORAL_TABLET | Freq: Four times a day (QID) | ORAL | Status: DC | PRN

## 2015-10-09 MED ORDER — OXYCODONE HCL ER 10 MG PO T12A: 10.0000 mg | EXTENDED_RELEASE_TABLET | Freq: Two times a day (BID) | ORAL | Status: DC

## 2015-10-09 MED ORDER — OXYCODONE HCL 5 MG PO TABS: 5.0000 mg | ORAL_TABLET | ORAL | Status: DC | PRN

## 2015-10-09 MED ORDER — ENOXAPARIN SODIUM 40 MG/0.4ML ~~LOC~~ SOLN: 40.0000 mg | SUBCUTANEOUS | Status: DC

## 2015-10-09 NOTE — Op Note (Signed)
PATIENT ID:      Colleen Duran  MRN:     [id number] DOB/AGE:    1946/05/12 / 70 y.o.                                             _____________________________           Lenore Manner ASSISTANT NOTE        I assisted Surgeon:  Lara Mulch, MD   ON THE PROCEDURE:  Procedure(s): RIGHT TOTAL KNEE ARTHROPLASTY on 10/08/15   I provided my assistance on this case for assistance with exposure, retraction, bleeding control, instrumentation, and closure         Electronicallly signed by [place] 10/09/2015  7:03 AM

## 2015-10-09 NOTE — Progress Notes (Signed)
Physical Therapy Treatment Patient Details Name: Colleen Duran MRN: [id number] DOB: 1946-02-14 Today's Date: 10/09/2015    History of Present Illness Admitted for RTKA;  has a past medical history of Hypertension; High cholesterol; Diabetes mellitus without complication ([place]); Arthritis; and Wears glasses.  has past surgical history that includes Abdominal hysterectomy; Bunionectomy; Cesarean section; Total knee arthroplasty (Left, 07/24/2014)    PT Comments    Patient is making slow progress toward mobility. Limited by feeling of nausea/lightheadedness this session. Continue to progress as tolerated.   Follow Up Recommendations  Home health PT     Equipment Recommendations  Rolling walker with 5" wheels;3in1 (PT) (may already have)    Recommendations for Other Services OT consult     Precautions / Restrictions Precautions Precautions: Knee Restrictions Weight Bearing Restrictions: Yes RLE Weight Bearing: Weight bearing as tolerated    Mobility  Bed Mobility               General bed mobility comments: exiting bathroom with NT upon entering room  Transfers Overall transfer level: Needs assistance Equipment used: Rolling walker (2 wheeled) Transfers: Sit to/from Stand Sit to Stand: Supervision         General transfer comment: vc for hand placement but good technique; supervision for safety  Ambulation/Gait Ambulation/Gait assistance: Min guard Ambulation Distance (Feet): 30 Feet Assistive device: Rolling walker (2 wheeled) Gait Pattern/deviations: Trunk flexed;Step-through pattern;Decreased stride length;Decreased stance time - right   Gait velocity interpretation: Below normal speed for age/gender General Gait Details: vc for posture, position of RW, and sequencing; pt limited by feeling hot, lightheaded, and nausea that subsided shortly after returning to chair   Stairs            Wheelchair Mobility    Modified Rankin (Stroke Patients Only)        Balance Overall balance assessment: Needs assistance Sitting-balance support: No upper extremity supported;Feet supported Sitting balance-Leahy Scale: Good     Standing balance support: Bilateral upper extremity supported Standing balance-Leahy Scale: Good                      Cognition Arousal/Alertness: Awake/alert Behavior During Therapy: WFL for tasks assessed/performed Overall Cognitive Status: Within Functional Limits for tasks assessed                      Exercises Total Joint Exercises Quad Sets: AROM;Both;10 reps;Seated Short Arc Quad: AROM;Right;10 reps;Seated Heel Slides: AROM;Right;10 reps;Seated Goniometric ROM: 5-70    General Comments        Pertinent Vitals/Pain Pain Assessment: No/denies pain Faces Pain Scale: Hurts little more Pain Location: right knee Pain Descriptors / Indicators: Other (Comment) ("stiffness") Pain Intervention(s): Monitored during session;Premedicated before session    [place] expects to be discharged to:: Private residence Living Arrangements: Children Available Help at Discharge: Family Type of Home: House Home Access: Stairs to enter Entrance Stairs-Rails: None Home Layout: One level Home Equipment: None      Prior Function Level of Independence: Independent          PT Goals (current goals can now be found in the care plan section) Acute Rehab PT Goals Patient Stated Goal: go home PT Goal Formulation: With patient Time For Goal Achievement: 10/15/15 Potential to Achieve Goals: Good Progress towards PT goals: Progressing toward goals    Frequency  7X/week    PT Plan Current plan remains appropriate    Co-evaluation  End of Session Equipment Utilized During Treatment: Gait belt Activity Tolerance: Patient tolerated treatment well Patient left: in chair;with call bell/phone within reach     Time: 1126-1151 PT Time Calculation (min) (ACUTE ONLY):  25 min  Charges:  $Gait Training: 8-22 mins $Therapeutic Exercise: 8-22 mins                    G Codes:      Salina April, PTA Pager: [phone]   10/09/2015, 4:29 PM

## 2015-10-09 NOTE — Progress Notes (Signed)
SPORTS MEDICINE AND JOINT REPLACEMENT  Lara Mulch, MD   Carlynn Spry, PA-C [place], [place], [place]  [id number]                             [phone]   PROGRESS NOTE  Subjective:  negative for Chest Pain  negative for Shortness of Breath  negative for Nausea/Vomiting   negative for Calf Pain  negative for Bowel Movement   Tolerating Diet: yes         Patient reports pain as 3 on 0-10 scale.    Objective: Vital signs in last 24 hours:   Patient Vitals for the past 24 hrs:  BP Temp Temp src Pulse Resp SpO2 Height Weight  10/09/15 0523 (!) 152/78 mmHg 98.5 F (36.9 C) Oral 66 18 96 % - -  10/09/15 0036 138/75 mmHg 99.4 F (37.4 C) Oral 78 16 97 % - -  10/08/15 2121 (!) 161/84 mmHg 98.3 F (36.8 C) Oral 80 16 100 % - -  10/08/15 1500 (!) 166/95 mmHg 97.4 F (36.3 C) - 74 16 100 % - -  10/08/15 1315 140/80 mmHg 98 F (36.7 C) - (!) 50 13 98 % - -  10/08/15 1300 130/83 mmHg - - (!) 48 15 96 % - -  10/08/15 1245 137/80 mmHg - - (!) 48 13 98 % - -  10/08/15 1230 129/63 mmHg - - (!) 55 14 99 % - -  10/08/15 1215 (!) 149/84 mmHg - - (!) 53 15 99 % - -  10/08/15 1200 (!) 143/75 mmHg - - (!) 50 16 100 % - -  10/08/15 1145 134/76 mmHg - - 71 15 97 % - -  10/08/15 1143 134/76 mmHg 97.9 F (36.6 C) - 71 18 98 % - -  10/08/15 0802 (!) 178/88 mmHg 99.1 F (37.3 C) Oral 66 18 98 % 5' 6.5" (1.689 m) 94.348 kg (208 lb)    @flow {1959:LAST@   Intake/Output from previous day:   01/30 0701 - 01/31 0700 In: 2160 [I.V.:2000] Out: 25    Intake/Output this shift:       Intake/Output      01/30 0701 - 01/31 0700 01/31 0701 - 02/01 0700   I.V. (mL/kg) 2000 (21.2)    IV Piggyback 160    Total Intake(mL/kg) 2160 (22.9)    Urine (mL/kg/hr) 0    Blood 25    Total Output 25     Net +2135          Urine Occurrence 3 x       LABORATORY DATA:  Recent Labs  10/08/15 1619  WBC 6.5  HGB 15.5*  HCT 44.7  PLT 142*    Recent Labs  10/08/15 1619  CREATININE 0.74    Lab Results  Component Value Date   INR 1.03 09/27/2015   INR 1.00 07/14/2014    Examination:  General appearance: alert, cooperative and no distress Extremities: Homans sign is negative, no sign of DVT  Wound Exam: clean, dry, intact   Drainage:  None: wound tissue dry  Motor Exam: Quadriceps and Hamstrings Intact  Sensory Exam: Deep Peroneal normal   Assessment:    1 Day Post-Op  Procedure(s) (LRB): RIGHT TOTAL KNEE ARTHROPLASTY (Right)  ADDITIONAL DIAGNOSIS:  Active Problems:   S/P total knee arthroplasty  Acute Blood Loss Anemia   Plan: Physical Therapy as ordered Weight Bearing as Tolerated (WBAT)  DVT Prophylaxis:  Lovenox  DISCHARGE PLAN: Home  DISCHARGE NEEDS: HHPT, CPM, Walker and 3-in-1 comode seat         Asra Gambrel 10/09/2015, 7:02 AM

## 2015-10-09 NOTE — Progress Notes (Signed)
Discharge instructions given. Pt verbalized understanding and all questions were answered.  

## 2015-10-09 NOTE — Progress Notes (Signed)
PT Cancellation Note  Patient Details Name: Colleen Duran MRN: [id number] DOB: [birth year]   Cancelled Treatment:    Reason Eval/Treat Not Completed: Patient declined, no reason specified Pt declined second session today and was getting ready to d/c. Daughter and RN present. Pt had no other questions for PT.   Salina April, PTA Pager: [phone]   10/09/2015, 4:30 PM

## 2015-10-09 NOTE — Discharge Instructions (Signed)

## 2015-10-09 NOTE — Op Note (Signed)
TOTAL KNEE REPLACEMENT OPERATIVE NOTE:  10/08/2015  1:04 PM  PATIENT:  Colleen Duran  70 y.o. female  PRE-OPERATIVE DIAGNOSIS:  primary osteoarthritis right knee  POST-OPERATIVE DIAGNOSIS:  primary osteoarthritis right knee  PROCEDURE:  Procedure(s): RIGHT TOTAL KNEE ARTHROPLASTY  SURGEON:  Surgeon(s): Vickey Huger, MD  PHYSICIAN ASSISTANT: Carlynn Spry, [facility]  ANESTHESIA:   spinal  DRAINS: Hemovac  SPECIMEN: None  COUNTS:  Correct  TOURNIQUET:   Total Tourniquet Time Documented: Thigh (Right) - 59 minutes Total: Thigh (Right) - 59 minutes   DICTATION:  Indication for procedure:    The patient is a 70 y.o. female who has failed conservative treatment for primary osteoarthritis right knee.  Informed consent was obtained prior to anesthesia. The risks versus benefits of the operation were explain and in a way the patient can, and did, understand.   On the implant demand matching protocol, this patient scored 10.  Therefore, this patient did" "did not receive a polyethylene insert with vitamin E which is a high demand implant.  Description of procedure:     The patient was taken to the operating room and placed under anesthesia.  The patient was positioned in the usual fashion taking care that all body parts were adequately padded and/or protected.  I foley catheter was not placed.  A tourniquet was applied and the leg prepped and draped in the usual sterile fashion.  The extremity was exsanguinated with the esmarch and tourniquet inflated to 350 mmHg.  Pre-operative range of motion was normal.  The knee was in 8 degree of significant valgus.  A midline incision approximately 6-7 inches long was made with a #10 blade.  A new blade was used to make a parapatellar arthrotomy going 2-3 cm into the quadriceps tendon, over the patella, and alongside the medial aspect of the patellar tendon.  A synovectomy was then performed with the #10 blade and forceps. I then elevated the deep MCL  off the medial tibial metaphysis subperiosteally around to the semimembranosus attachment.    I everted the patella and used calipers to measure patellar thickness.  I used the reamer to ream down to appropriate thickness to recreate the native thickness.  I then removed excess bone with the rongeur and sagittal saw.  I used the appropriately sized template and drilled the three lug holes.  I then put the trial in place and measured the thickness with the calipers to ensure recreation of the native thickness.  The trial was then removed and the patella subluxed and the knee brought into flexion.  A homan retractor was place to retract and protect the patella and lateral structures.  A Z-retractor was place medially to protect the medial structures.  The extra-medullary alignment system was used to make cut the tibial articular surface perpendicular to the anamotic axis of the tibia and in 3 degrees of posterior slope.  The cut surface and alignment jig was removed.  I then used the intramedullary alignment guide to make a 4 valgus cut on the distal femur.  I then marked out the epicondylar axis on the distal femur.  The posterior condylar axis measured 5 degrees.  I then used the anterior referencing sizer and measured the femur to be a size 11.  The 4-In-1 cutting block was screwed into place in external rotation matching the posterior condylar angle, making our cuts perpendicular to the epicondylar axis.  Anterior, posterior and chamfer cuts were made with the sagittal saw.  The cutting block and cut  pieces were removed.  A lamina spreader was placed in 90 degrees of flexion.  The ACL, PCL, menisci, and posterior condylar osteophytes were removed.  A 13 mm spacer blocked was found to offer good flexion and extension gap balance after moderate in degree releasing.   The scoop retractor was then placed and the femoral finishing block was pinned in place.  The small sagittal saw was used as well as the lug  drill to finish the femur.  The block and cut surfaces were removed and the medullary canal hole filled with autograft bone from the cut pieces.  The tibia was delivered forward in deep flexion and external rotation.  A size G tray was selected and pinned into place centered on the medial 1/3 of the tibial tubercle.  The reamer and keel was used to prepare the tibia through the tray.    I then trialed with the size 11 femur, size G tibia, a 13 mm insert and the 38 patella.  I had excellent flexion/extension gap balance, excellent patella tracking.  Flexion was full and beyond 120 degrees; extension was zero.  These components were chosen and the staff opened them to me on the back table while the knee was lavaged copiously and the cement mixed.  The soft tissue was infiltrated with 60cc of exparel 1.3% through a 21 gauge needle.  I cemented in the components and removed all excess cement.  The polyethylene tibial component was snapped into place and the knee placed in extension while cement was hardening.  The capsule was infilltrated with 30cc of .25% Marcaine with epinephrine.  A hemovac was place in the joint exiting superolaterally.  A pain pump was place superomedially superficial to the arthrotomy.  Once the cement was hard, the tourniquet was let down.  Hemostasis was obtained.  The arthrotomy was closed with figure-8 #1 vicryl sutures.  The deep soft tissues were closed with #0 vicryls and the subcuticular layer closed with a running #2-0 vicryl.  The skin was reapproximated and closed with skin staples.  The wound was dressed with xeroform, 4 x4's, 2 ABD sponges, a single layer of webril and a TED stocking.   The patient was then awakened, extubated, and taken to the recovery room in stable condition.  BLOOD LOSS:  300cc DRAINS: 1 hemovac, 1 pain catheter COMPLICATIONS:  None.  PLAN OF CARE: Admit to inpatient   PATIENT DISPOSITION:  PACU - hemodynamically stable.   Delay start of  Pharmacological VTE agent (>24hrs) due to surgical blood loss or risk of bleeding:  not applicable  Please fax a copy of this op note to my office at [phone] (please only include page 1 and 2 of the Case Information op note)

## 2015-10-09 NOTE — Evaluation (Signed)
Occupational Therapy Evaluation AND Discharge  Patient Details Name: Colleen Duran MRN: [id number] DOB: [birth year] Today's Date: 10/09/2015    History of Present Illness Admitted for RTKA;  has a past medical history of Hypertension; High cholesterol; Diabetes mellitus without complication ([place]); Arthritis; and Wears glasses.  has past surgical history that includes Abdominal hysterectomy; Bunionectomy; Cesarean section; Total knee arthroplasty (Left, 07/24/2014)   Clinical Impression   Patient admitted with above. Patient independent PTA. Patient currently functioning at an overall supervision level.  No additional OT needs identified, D/C from acute OT services and no additional follow-up OT needs at this time. All appropriate education provided to patient. Please re-order OT if needed.  Plan is for patient to discharge home today from acute care.     Follow Up Recommendations  No OT follow up;Supervision - Intermittent    Equipment Recommendations  3 in 1 bedside comode    Recommendations for Other Services  None at this time    Precautions / Restrictions Precautions Precautions: Knee Restrictions Weight Bearing Restrictions: Yes RLE Weight Bearing: Weight bearing as tolerated    Mobility Bed Mobility General bed mobility comments: Pt found seated in recliner upon OT entering/exiting room   Transfers Overall transfer level: Needs assistance Equipment used: Rolling walker (2 wheeled) Transfers: Sit to/from Stand Sit to Stand: Supervision  General transfer comment: Supervision for safety. Cues for hand placement and technique for RLE management     Balance Overall balance assessment: Needs assistance Sitting-balance support: No upper extremity supported;Feet supported Sitting balance-Leahy Scale: Good     Standing balance support: Bilateral upper extremity supported;During functional activity Standing balance-Leahy Scale: Good    ADL Overall ADL's : Needs  assistance/impaired General ADL Comments: Pt overall supervision for ADLs and functional transfers/mobility. Pt ambulated into BR for toilet transfer using 3-n-1 and simulated walk-in shower transfer. Administerred patient handout for safe and effective shower stall transfers. Pt reports her daughter will be present to assist prn post acute d/c.     Pertinent Vitals/Pain Pain Assessment: Faces Faces Pain Scale: Hurts little more Pain Location: right knee Pain Descriptors / Indicators: Other (Comment) ("stiffness") Pain Intervention(s): Monitored during session;Repositioned     Hand Dominance Right   Extremity/Trunk Assessment Upper Extremity Assessment Upper Extremity Assessment: Overall WFL for tasks assessed   Lower Extremity Assessment Lower Extremity Assessment: Defer to PT evaluation   Cervical / Trunk Assessment Cervical / Trunk Assessment: Normal   Communication Communication Communication: No difficulties   Cognition Arousal/Alertness: Awake/alert Behavior During Therapy: WFL for tasks assessed/performed Overall Cognitive Status: Within Functional Limits for tasks assessed              Home Living Family/patient expects to be discharged to:: Private residence Living Arrangements: Children Available Help at Discharge: Family Type of Home: House Home Access: Stairs to enter Technical brewer of Steps: 1 Entrance Stairs-Rails: None Home Layout: One level     Bathroom Shower/Tub: Occupational psychologist: Standard Bathroom Accessibility: Yes   Home Equipment: None   Prior Functioning/Environment Level of Independence: Independent     OT Diagnosis: Generalized weakness;Acute pain   OT Problem List:   N/a, no acute OT needs identified at this time     OT Treatment/Interventions:   N/a, no acute OT needs identified at this time     OT Goals(Current goals can be found in the care plan section) Acute Rehab OT Goals Patient Stated Goal: go home  today OT Goal Formulation: All assessment and education  complete, DC therapy  OT Frequency:   N/a, no acute OT needs identified at this time     Barriers to D/C:  none known at this time    End of Session Equipment Utilized During Treatment: Rolling walker CPM Right Knee CPM Right Knee: Off  Activity Tolerance: Patient tolerated treatment well Patient left: in chair;with call bell/phone within reach   Time: [id number] OT Time Calculation (min): 15 min Charges:  OT General Charges $OT Visit: 1 Procedure OT Evaluation $OT Eval Low Complexity: 1 Procedure  Chrys Racer , MS, OTR/L, CLT Pager: [phone]  10/09/2015, 1:25 PM

## 2015-10-09 NOTE — Care Management Note (Signed)
Case Management Note  Patient Details  Name: Colleen Duran MRN: [id number] Date of Birth: 1945-11-21  Subjective/Objective:          S/p right total knee arthroplasty         Action/Plan: Set up with [facility] for HHPT by MD office. Spoke with patient, no change in discharge plan. Patient stated that she has a rolling walker and her daughter will be able to assist her after discharge. Contacted [employer], spoke with Abigail Butts, they will delivered CPM and 3N1 to patient's home this evening. Contacted Keri at [facility] and informed her that patient is discharging today.     Expected Discharge Date:                  Expected Discharge Plan:  [place]  In-House Referral:  NA  Discharge planning Services  CM Consult  Post Acute Care Choice:  Home Health, Durable Medical Equipment Choice offered to:  Patient  DME Arranged:  3-N-1, CPM DME Agency:  [facility]  HH Arranged:  PT [place] Agency:  Other - See comment  Status of Service:  Completed, signed off  Medicare Important Message Given:    Date Medicare IM Given:    Medicare IM give by:    Date Additional Medicare IM Given:    Additional Medicare Important Message give by:     If discussed at [place] of Stay Meetings, dates discussed:    Additional Comments:  Nila Nephew, RN 10/09/2015, 12:19 PM

## 2015-10-10 DIAGNOSIS — M069 Rheumatoid arthritis, unspecified: Secondary | ICD-10-CM | POA: Diagnosis not present

## 2015-10-10 DIAGNOSIS — Z471 Aftercare following joint replacement surgery: Secondary | ICD-10-CM | POA: Diagnosis not present

## 2015-10-10 DIAGNOSIS — Z96651 Presence of right artificial knee joint: Secondary | ICD-10-CM | POA: Diagnosis not present

## 2015-10-10 DIAGNOSIS — I10 Essential (primary) hypertension: Secondary | ICD-10-CM | POA: Diagnosis not present

## 2015-10-10 DIAGNOSIS — Z96652 Presence of left artificial knee joint: Secondary | ICD-10-CM | POA: Diagnosis not present

## 2015-10-10 DIAGNOSIS — Z7984 Long term (current) use of oral hypoglycemic drugs: Secondary | ICD-10-CM | POA: Diagnosis not present

## 2015-10-10 DIAGNOSIS — E119 Type 2 diabetes mellitus without complications: Secondary | ICD-10-CM | POA: Diagnosis not present

## 2015-10-10 DIAGNOSIS — M1711 Unilateral primary osteoarthritis, right knee: Secondary | ICD-10-CM | POA: Diagnosis not present

## 2015-10-12 DIAGNOSIS — Z7984 Long term (current) use of oral hypoglycemic drugs: Secondary | ICD-10-CM | POA: Diagnosis not present

## 2015-10-12 DIAGNOSIS — Z96651 Presence of right artificial knee joint: Secondary | ICD-10-CM | POA: Diagnosis not present

## 2015-10-12 DIAGNOSIS — M069 Rheumatoid arthritis, unspecified: Secondary | ICD-10-CM | POA: Diagnosis not present

## 2015-10-12 DIAGNOSIS — Z471 Aftercare following joint replacement surgery: Secondary | ICD-10-CM | POA: Diagnosis not present

## 2015-10-12 DIAGNOSIS — E119 Type 2 diabetes mellitus without complications: Secondary | ICD-10-CM | POA: Diagnosis not present

## 2015-10-12 DIAGNOSIS — I10 Essential (primary) hypertension: Secondary | ICD-10-CM | POA: Diagnosis not present

## 2015-10-12 DIAGNOSIS — Z96652 Presence of left artificial knee joint: Secondary | ICD-10-CM | POA: Diagnosis not present

## 2015-10-15 DIAGNOSIS — M069 Rheumatoid arthritis, unspecified: Secondary | ICD-10-CM | POA: Diagnosis not present

## 2015-10-15 DIAGNOSIS — Z96652 Presence of left artificial knee joint: Secondary | ICD-10-CM | POA: Diagnosis not present

## 2015-10-15 DIAGNOSIS — I10 Essential (primary) hypertension: Secondary | ICD-10-CM | POA: Diagnosis not present

## 2015-10-15 DIAGNOSIS — E119 Type 2 diabetes mellitus without complications: Secondary | ICD-10-CM | POA: Diagnosis not present

## 2015-10-15 DIAGNOSIS — Z96651 Presence of right artificial knee joint: Secondary | ICD-10-CM | POA: Diagnosis not present

## 2015-10-15 DIAGNOSIS — Z7984 Long term (current) use of oral hypoglycemic drugs: Secondary | ICD-10-CM | POA: Diagnosis not present

## 2015-10-15 DIAGNOSIS — Z471 Aftercare following joint replacement surgery: Secondary | ICD-10-CM | POA: Diagnosis not present

## 2015-10-16 NOTE — Discharge Summary (Signed)
SPORTS MEDICINE & JOINT REPLACEMENT   Lara Mulch, MD   Carlynn Spry, PA-C Carlyon Shadow, PA-C [place], [place], [place]  [address]                             [phone]  PATIENT ID: Colleen Duran        MRN:  [id number]          DOB/AGE: 03-03-1946 / 70 y.o.    DISCHARGE SUMMARY  ADMISSION DATE:    10/08/2015 DISCHARGE DATE:   10/09/2015  ADMISSION DIAGNOSIS: primary osteoarthritis right knee    DISCHARGE DIAGNOSIS:  primary osteoarthritis right knee    ADDITIONAL DIAGNOSIS: Active Problems:   S/P total knee arthroplasty  Past Medical History  Diagnosis Date  . Hypertension   . High cholesterol   . Diabetes mellitus without complication ([place])     TYPE 2  . Arthritis     RA  . Wears glasses     PROCEDURE: Procedure(s): RIGHT TOTAL KNEE ARTHROPLASTY on 10/08/2015  CONSULTS:     HISTORY:  See H&P in chart  HOSPITAL COURSE:  PAULETTE GAITA is a 70 y.o. admitted on 10/08/2015 and found to have a diagnosis of primary osteoarthritis right knee.  After appropriate laboratory studies were obtained  they were taken to the operating room on 10/08/2015 and underwent Procedure(s): RIGHT TOTAL KNEE ARTHROPLASTY.   They were given perioperative antibiotics:  Anti-infectives    Start     Dose/Rate Route Frequency Ordered Stop   10/08/15 1600  ceFAZolin (ANCEF) IVPB 2 g/50 mL premix     2 g 100 mL/hr over 30 Minutes Intravenous Every 6 hours 10/08/15 1359 10/08/15 2207   10/08/15 0945  ceFAZolin (ANCEF) IVPB 2 g/50 mL premix     2 g 100 mL/hr over 30 Minutes Intravenous To ShortStay Surgical 10/05/15 0759 10/08/15 1020    .  Patient given tranexamic acid IV or topical and exparel intra-operatively.  Tolerated the procedure well.    POD# 1: Vital signs were stable.  Patient denied Chest pain, shortness of breath, or calf pain.  Patient was started on Lovenox 30 mg subcutaneously twice daily at 8am.  Consults to PT, OT, and care management were made.  The patient  was weight bearing as tolerated.  CPM was placed on the operative leg 0-90 degrees for 6-8 hours a day. When out of the CPM, patient was placed in the foam block to achieve full extension. Incentive spirometry was taught.  Dressing was changed.       POD #2, Continued  PT for ambulation and exercise program.  IV saline locked.  O2 discontinued.    The remainder of the hospital course was dedicated to ambulation and strengthening.   The patient was discharged on 1 day post op in  Good condition.  Blood products given:none  DIAGNOSTIC STUDIES: Recent vital signs: No data found.      Recent laboratory studies: No results for input(s): WBC, HGB, HCT, PLT in the last 168 hours. No results for input(s): NA, K, CL, CO2, BUN, CREATININE, GLUCOSE, CALCIUM in the last 168 hours. Lab Results  Component Value Date   INR 1.03 09/27/2015   INR 1.00 07/14/2014     Recent Radiographic Studies :  Dg Chest 2 View  09/27/2015  CLINICAL DATA:  Preop. Right total knee arthroplasty. History of hypertension and diabetes. EXAM: CHEST  2 VIEW COMPARISON:  09/05/2014 FINDINGS:  The cardiac silhouette is upper limits of normal to mildly enlarged in size, unchanged. Unchanged tortuous thoracic aorta. The lungs are well inflated and clear. No pleural effusion or pneumothorax is seen. No acute osseous abnormality is seen. IMPRESSION: No active cardiopulmonary disease. Electronically Signed   By: Logan Bores M.D.   On: 09/27/2015 09:30    DISCHARGE INSTRUCTIONS: Discharge Instructions    CPM    Complete by:  As directed   Continuous passive motion machine (CPM):      Use the CPM from 0 to 90 for 6-8 hours per day.      You may increase by 10 per day.  You may break it up into 2 or 3 sessions per day.      Use CPM for 2 weeks or until you are told to stop.     Call MD / Call 911    Complete by:  As directed   If you experience chest pain or shortness of breath, CALL 911 and be transported to the hospital  emergency room.  If you develope a fever above 101 F, pus (white drainage) or increased drainage or redness at the wound, or calf pain, call your surgeon's office.     Change dressing    Complete by:  As directed   Change dressing on wednesday, then change the dressing daily with sterile 4 x 4 inch gauze dressing and apply TED hose.     Constipation Prevention    Complete by:  As directed   Drink plenty of fluids.  Prune juice may be helpful.  You may use a stool softener, such as Colace (over the counter) 100 mg twice a day.  Use MiraLax (over the counter) for constipation as needed.     Diet - low sodium heart healthy    Complete by:  As directed      Do not put a pillow under the knee. Place it under the heel.    Complete by:  As directed      Driving restrictions    Complete by:  As directed   No driving for 2 weeks     Increase activity slowly as tolerated    Complete by:  As directed      Lifting restrictions    Complete by:  As directed   No lifting for 2 weeks     TED hose    Complete by:  As directed   Use stockings (TED hose) for 2 weeks on both leg(s).  You may remove them at night for sleeping.           DISCHARGE MEDICATIONS:     Medication List    TAKE these medications        acetaminophen 500 MG tablet  Commonly known as:  TYLENOL  Take 500 mg by mouth every 6 (six) hours as needed for mild pain.     amLODipine 10 MG tablet  Commonly known as:  NORVASC  Take 10 mg by mouth daily.     atorvastatin 40 MG tablet  Commonly known as:  LIPITOR  Take 40 mg by mouth daily at 6 PM.     bimatoprost 0.03 % ophthalmic solution  Commonly known as:  LUMIGAN  Place 1 drop into both eyes at bedtime.     enoxaparin 40 MG/0.4ML injection  Commonly known as:  LOVENOX  Inject 0.4 mLs (40 mg total) into the skin daily.     labetalol 200 MG tablet  Commonly known  as:  NORMODYNE  Take 200 mg by mouth 2 (two) times daily.     metFORMIN 500 MG tablet  Commonly known  as:  GLUCOPHAGE  Take 500 mg by mouth 2 (two) times daily with a meal.     methocarbamol 500 MG tablet  Commonly known as:  ROBAXIN  Take 1-2 tablets (500-1,000 mg total) by mouth every 6 (six) hours as needed for muscle spasms.     multivitamin with minerals Tabs tablet  Take 1 tablet by mouth daily.     oxyCODONE 5 MG immediate release tablet  Commonly known as:  Oxy IR/ROXICODONE  Take 1-2 tablets (5-10 mg total) by mouth every 3 (three) hours as needed for breakthrough pain.     oxyCODONE 10 mg 12 hr tablet  Commonly known as:  OXYCONTIN  Take 1 tablet (10 mg total) by mouth every 12 (twelve) hours.        FOLLOW UP VISIT:       Follow-up Information    Follow up with Rudean Haskell, MD. Call on 10/23/2015.   Specialty:  Orthopedic Surgery   Contact information:   [place] [address] [phone]       Follow up with [facility].   Why:  They will contact you to schedule home therapy visits.   Contact information:   telephone # [phone]      DISPOSITION: HOME   CONDITION:  Good   Marigene Erler 10/16/2015, 7:23 AM

## 2015-10-17 DIAGNOSIS — Z471 Aftercare following joint replacement surgery: Secondary | ICD-10-CM | POA: Diagnosis not present

## 2015-10-17 DIAGNOSIS — M069 Rheumatoid arthritis, unspecified: Secondary | ICD-10-CM | POA: Diagnosis not present

## 2015-10-17 DIAGNOSIS — Z96651 Presence of right artificial knee joint: Secondary | ICD-10-CM | POA: Diagnosis not present

## 2015-10-17 DIAGNOSIS — I10 Essential (primary) hypertension: Secondary | ICD-10-CM | POA: Diagnosis not present

## 2015-10-17 DIAGNOSIS — Z7984 Long term (current) use of oral hypoglycemic drugs: Secondary | ICD-10-CM | POA: Diagnosis not present

## 2015-10-17 DIAGNOSIS — E119 Type 2 diabetes mellitus without complications: Secondary | ICD-10-CM | POA: Diagnosis not present

## 2015-10-17 DIAGNOSIS — Z96652 Presence of left artificial knee joint: Secondary | ICD-10-CM | POA: Diagnosis not present

## 2015-10-19 DIAGNOSIS — I10 Essential (primary) hypertension: Secondary | ICD-10-CM | POA: Diagnosis not present

## 2015-10-19 DIAGNOSIS — M069 Rheumatoid arthritis, unspecified: Secondary | ICD-10-CM | POA: Diagnosis not present

## 2015-10-19 DIAGNOSIS — Z96652 Presence of left artificial knee joint: Secondary | ICD-10-CM | POA: Diagnosis not present

## 2015-10-19 DIAGNOSIS — Z471 Aftercare following joint replacement surgery: Secondary | ICD-10-CM | POA: Diagnosis not present

## 2015-10-19 DIAGNOSIS — Z7984 Long term (current) use of oral hypoglycemic drugs: Secondary | ICD-10-CM | POA: Diagnosis not present

## 2015-10-19 DIAGNOSIS — Z96651 Presence of right artificial knee joint: Secondary | ICD-10-CM | POA: Diagnosis not present

## 2015-10-19 DIAGNOSIS — E119 Type 2 diabetes mellitus without complications: Secondary | ICD-10-CM | POA: Diagnosis not present

## 2015-10-22 DIAGNOSIS — Z96652 Presence of left artificial knee joint: Secondary | ICD-10-CM | POA: Diagnosis not present

## 2015-10-22 DIAGNOSIS — E119 Type 2 diabetes mellitus without complications: Secondary | ICD-10-CM | POA: Diagnosis not present

## 2015-10-22 DIAGNOSIS — Z471 Aftercare following joint replacement surgery: Secondary | ICD-10-CM | POA: Diagnosis not present

## 2015-10-22 DIAGNOSIS — I10 Essential (primary) hypertension: Secondary | ICD-10-CM | POA: Diagnosis not present

## 2015-10-22 DIAGNOSIS — M069 Rheumatoid arthritis, unspecified: Secondary | ICD-10-CM | POA: Diagnosis not present

## 2015-10-22 DIAGNOSIS — Z7984 Long term (current) use of oral hypoglycemic drugs: Secondary | ICD-10-CM | POA: Diagnosis not present

## 2015-10-22 DIAGNOSIS — Z96651 Presence of right artificial knee joint: Secondary | ICD-10-CM | POA: Diagnosis not present

## 2015-10-23 DIAGNOSIS — M25561 Pain in right knee: Secondary | ICD-10-CM | POA: Diagnosis not present

## 2015-10-23 DIAGNOSIS — Z96651 Presence of right artificial knee joint: Secondary | ICD-10-CM | POA: Diagnosis not present

## 2015-10-24 DIAGNOSIS — Z471 Aftercare following joint replacement surgery: Secondary | ICD-10-CM | POA: Diagnosis not present

## 2015-10-24 DIAGNOSIS — Z7984 Long term (current) use of oral hypoglycemic drugs: Secondary | ICD-10-CM | POA: Diagnosis not present

## 2015-10-24 DIAGNOSIS — M069 Rheumatoid arthritis, unspecified: Secondary | ICD-10-CM | POA: Diagnosis not present

## 2015-10-24 DIAGNOSIS — Z96651 Presence of right artificial knee joint: Secondary | ICD-10-CM | POA: Diagnosis not present

## 2015-10-24 DIAGNOSIS — I10 Essential (primary) hypertension: Secondary | ICD-10-CM | POA: Diagnosis not present

## 2015-10-24 DIAGNOSIS — Z96652 Presence of left artificial knee joint: Secondary | ICD-10-CM | POA: Diagnosis not present

## 2015-10-24 DIAGNOSIS — E119 Type 2 diabetes mellitus without complications: Secondary | ICD-10-CM | POA: Diagnosis not present

## 2015-10-26 DIAGNOSIS — E119 Type 2 diabetes mellitus without complications: Secondary | ICD-10-CM | POA: Diagnosis not present

## 2015-10-26 DIAGNOSIS — I10 Essential (primary) hypertension: Secondary | ICD-10-CM | POA: Diagnosis not present

## 2015-10-26 DIAGNOSIS — Z96652 Presence of left artificial knee joint: Secondary | ICD-10-CM | POA: Diagnosis not present

## 2015-10-26 DIAGNOSIS — Z96651 Presence of right artificial knee joint: Secondary | ICD-10-CM | POA: Diagnosis not present

## 2015-10-26 DIAGNOSIS — M069 Rheumatoid arthritis, unspecified: Secondary | ICD-10-CM | POA: Diagnosis not present

## 2015-10-26 DIAGNOSIS — Z471 Aftercare following joint replacement surgery: Secondary | ICD-10-CM | POA: Diagnosis not present

## 2015-10-26 DIAGNOSIS — Z7984 Long term (current) use of oral hypoglycemic drugs: Secondary | ICD-10-CM | POA: Diagnosis not present

## 2015-10-30 DIAGNOSIS — Z471 Aftercare following joint replacement surgery: Secondary | ICD-10-CM | POA: Diagnosis not present

## 2015-10-30 DIAGNOSIS — I10 Essential (primary) hypertension: Secondary | ICD-10-CM | POA: Diagnosis not present

## 2015-10-30 DIAGNOSIS — Z96652 Presence of left artificial knee joint: Secondary | ICD-10-CM | POA: Diagnosis not present

## 2015-10-30 DIAGNOSIS — Z7984 Long term (current) use of oral hypoglycemic drugs: Secondary | ICD-10-CM | POA: Diagnosis not present

## 2015-10-30 DIAGNOSIS — M069 Rheumatoid arthritis, unspecified: Secondary | ICD-10-CM | POA: Diagnosis not present

## 2015-10-30 DIAGNOSIS — Z96651 Presence of right artificial knee joint: Secondary | ICD-10-CM | POA: Diagnosis not present

## 2015-10-30 DIAGNOSIS — E119 Type 2 diabetes mellitus without complications: Secondary | ICD-10-CM | POA: Diagnosis not present

## 2015-11-01 DIAGNOSIS — I10 Essential (primary) hypertension: Secondary | ICD-10-CM | POA: Diagnosis not present

## 2015-11-01 DIAGNOSIS — Z96652 Presence of left artificial knee joint: Secondary | ICD-10-CM | POA: Diagnosis not present

## 2015-11-01 DIAGNOSIS — Z7984 Long term (current) use of oral hypoglycemic drugs: Secondary | ICD-10-CM | POA: Diagnosis not present

## 2015-11-01 DIAGNOSIS — E119 Type 2 diabetes mellitus without complications: Secondary | ICD-10-CM | POA: Diagnosis not present

## 2015-11-01 DIAGNOSIS — M069 Rheumatoid arthritis, unspecified: Secondary | ICD-10-CM | POA: Diagnosis not present

## 2015-11-01 DIAGNOSIS — Z471 Aftercare following joint replacement surgery: Secondary | ICD-10-CM | POA: Diagnosis not present

## 2015-11-01 DIAGNOSIS — Z96651 Presence of right artificial knee joint: Secondary | ICD-10-CM | POA: Diagnosis not present

## 2015-11-05 ENCOUNTER — Ambulatory Visit (HOSPITAL_COMMUNITY): Payer: Medicare HMO | Attending: Orthopedic Surgery | Admitting: Physical Therapy

## 2015-11-05 ENCOUNTER — Encounter (HOSPITAL_COMMUNITY): Payer: Self-pay | Admitting: Physical Therapy

## 2015-11-05 DIAGNOSIS — R2681 Unsteadiness on feet: Secondary | ICD-10-CM

## 2015-11-05 DIAGNOSIS — M24661 Ankylosis, right knee: Secondary | ICD-10-CM

## 2015-11-05 DIAGNOSIS — Z96651 Presence of right artificial knee joint: Secondary | ICD-10-CM | POA: Diagnosis not present

## 2015-11-05 DIAGNOSIS — R600 Localized edema: Secondary | ICD-10-CM

## 2015-11-05 DIAGNOSIS — R531 Weakness: Secondary | ICD-10-CM

## 2015-11-05 DIAGNOSIS — M6281 Muscle weakness (generalized): Secondary | ICD-10-CM | POA: Diagnosis not present

## 2015-11-05 NOTE — Patient Instructions (Signed)
Seated knee Self-Mobilization: Knee Flexion / Extension (Sitting)    Gently push right leg back with other leg until a stretch is felt. Hold 20 seconds. Relax. Repeat ____ times per set. Do ____ sets per session. Do __3__ sessions per day.  http://orth.exer.us/592   Copyright  VHI. All rights reserved.

## 2015-11-05 NOTE — Therapy (Signed)
[place], [place], [address] Phone: [phone]   Fax:  [phone]  Physical Therapy Evaluation  Patient Details  Name: Colleen Duran MRN: [id number] Date of Birth: 1945/11/28 Referring Provider: Vickey Huger, MD  Encounter Date: 11/05/2015      PT End of Session - 11/05/15 1043    Visit Number 1   Number of Visits 12   Date for PT Re-Evaluation 12/31/15   Authorization Type Medicare   Authorization Time Period G-code due by 10th visit, KX by 15 visit.    Authorization - Visit Number 1   Authorization - Number of Visits 10   PT Start Time 0809   PT Stop Time [id number]   PT Time Calculation (min) 38 min   Activity Tolerance Patient tolerated treatment well   Behavior During Therapy [facility] for tasks assessed/performed      Past Medical History  Diagnosis Date  . Hypertension   . High cholesterol   . Diabetes mellitus without complication ([place])     TYPE 2  . Arthritis     RA  . Wears glasses     Past Surgical History  Procedure Laterality Date  . Abdominal hysterectomy    . Bunionectomy      bilateral feet  . Cesarean section    . 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 Total knee arthroplasty Left 07/24/2014    Procedure: LEFT TOTAL KNEE ARTHROPLASTY;  Surgeon: Vickey Huger, MD;  Location: [facility];  Service: Orthopedics;  Laterality: Left;  . Colonoscopy N/A 05/11/2015    Procedure: COLONOSCOPY;  Surgeon: Danie Binder, MD;  [facility];  Service: Endoscopy;  Laterality: N/A;  2:15 PM  . Total knee arthroplasty Right 10/08/2015    Procedure: RIGHT TOTAL KNEE ARTHROPLASTY;  Surgeon: Vickey Huger, MD;  Location: [facility];  Service: Orthopedics;  Laterality: Right;    There were no vitals filed for this visit.  Visit Diagnosis:  Status post total right knee replacement - Plan: PT plan of care cert/re-cert  Decreased range of knee movement, right - Plan: PT plan of care  cert/re-cert  Decreased strength - Plan: PT plan of care cert/re-cert  Gait instability - Plan: PT plan of care cert/re-cert  Localized edema - Plan: PT plan of care cert/re-cert      Subjective Assessment - 11/05/15 0816    Subjective States that she feels like she is improving gradually. She states that she has  been having HHPT but they just stopped last week.    Limitations Standing;Walking;House hold activities   How long can you sit comfortably? unlimited   How long can you stand comfortably? 10 minutes   How long can you walk comfortably? 10 minutes   Patient Stated Goals Be more active and more independent   Currently in Pain? Yes   Pain Score 4    Pain Location Knee   Pain Orientation Right   Pain Descriptors / Indicators Nagging   Pain Type Surgical pain   Pain Onset More than a month ago   Pain Frequency Intermittent   Aggravating Factors  walking, bending, straightening   Pain Relieving Factors walking   Effect of Pain on Daily Activities limites tolerance for ADLs            [facility] PT Assessment - 11/05/15 0001    Assessment   Medical Diagnosis Rt TKA   Referring Provider Vickey Huger, MD  Onset Date/Surgical Date 10/08/15   Next MD Visit March   Prior Therapy HHPT   Balance Screen   Has the patient fallen in the past 6 months No   [place] residence   Prior Function   Level of Independence Independent with basic ADLs   Cognition   Overall Cognitive Status Within Functional Limits for tasks assessed   Observation/Other Assessments   Observations Rt LE edema, well healing incision   Focus on Therapeutic Outcomes (FOTO)  59% limitation   Sensation   Light Touch Appears Intact   AROM   Right Knee Extension 5   Right Knee Flexion 91   Strength   Right Knee Flexion 3+/5   Right Knee Extension 3+/5   Palpation   Patella mobility restricted in all planes   Ambulation/Gait   Ambulation/Gait Yes   Ambulation  Distance (Feet) 60 Feet   Assistive device Small based quad cane   Gait Pattern Decreased weight shift to right   Ambulation Surface Level   Gait velocity decreased   Gait Comments decreased weight shift to Rt with decreased stride lenght with bilateral LEs.                            PT Education - 11/05/15 1042    Education provided Yes   Education Details General POC, seated knee flexion stretch (unable to print for handout)   Person(s) Educated Patient   Methods Explanation;Demonstration;Tactile cues;Verbal cues   Comprehension Verbalized understanding;Returned demonstration          PT Short Term Goals - 11/05/15 1051    PT SHORT TERM GOAL #1   Title Patient to be independent with HEP for ROM and strengthening.   Time 3   Period Weeks   Status New   PT SHORT TERM GOAL #2   Title Patient to have 105 degrees of Rt knee flexion for sitting on low surfaces.    Time 3   Period Weeks   Status New   PT SHORT TERM GOAL #3   Title Patient to demo 4-/5 strength with Rt knee extension for gait stability.   Time 3   Period Weeks   Status New           PT Long Term Goals - 11/05/15 1054    PT LONG TERM GOAL #1   Title Patient to be independent with advanced HEP for continuation of PT gains upon D/C.    Time 6   Period Weeks   Status New   PT LONG TERM GOAL #2   Title Patient to have 115 degrees of Rt knee flexion for lower surface sitting tolerance.    Time 6   Period Weeks   Status New   PT LONG TERM GOAL #3   Title Patient to demo 4/5 strength with rt knee extension for gait stability.    Time 6   Period Weeks   Status New   PT LONG TERM GOAL #4   Title Patient to have 0 degrees of Rt knee extension for gait stability.   Time 6   Period Weeks   Status New   PT LONG TERM GOAL #5   Title Patient to demo an imporvement in her FOTO score for improved overall function.    Time 6   Period Weeks   Status New  Plan -  2015-11-09 1046    Clinical Impression Statement Patient is a 70 y.o. female now s/p TKA on 10/08/15. She is presenting with decreased strength, ROM, gait instability and general decreaed functional mobility. She is appropriate for ongoing PT sessions to address these areas for improved independence and safety. The patient agreed with the POC and consents to continued PT sessions.    Pt will benefit from skilled therapeutic intervention in order to improve on the following deficits Abnormal gait;Decreased activity tolerance;Decreased range of motion;Decreased mobility;Decreased strength;Difficulty walking;Increased edema;Pain   Rehab Potential Good   PT Frequency 2x / week  Patient requesting 2X week due to transportation limitations   PT Duration 6 weeks   PT Treatment/Interventions ADLs/Self Care Home Management;Cryotherapy;Electrical Stimulation;Moist Heat;Patient/family education;Balance training;Therapeutic exercise;Therapeutic activities;Functional mobility training;Stair training;Gait training;DME Instruction;Manual techniques   PT Next Visit Plan Review HEP and modify as needed. Focus on knee flexion and knee extension exercises. Unable to print exercise from today, please provide.    PT Home Exercise Plan Modify HEP as needed.    Consulted and Agree with Plan of Care Patient          G-Codes - 11/09/2015 1214-12-13    Functional Assessment Tool Used FOTO, clinical judgment   Functional Limitation Mobility: Walking and moving around   Mobility: Walking and Moving Around Current Status [phone]) At least 40 percent but less than 60 percent impaired, limited or restricted   Mobility: Walking and Moving Around Goal Status [phone]) At least 20 percent but less than 40 percent impaired, limited or restricted       Problem List Patient Active Problem List   Diagnosis Date Noted  . S/P total knee arthroplasty 10/08/2015  . Special screening for malignant neoplasms, colon   . S/P total knee  replacement using cement 07/25/2014    Linard Millers 2015-11-09, 12:20 PM  [facility] [address] [place], [place], [address] Phone: [phone]   Fax:  [phone]  Name: Colleen Duran MRN: [id number] Date of Birth: [birth year]

## 2015-11-07 ENCOUNTER — Ambulatory Visit (HOSPITAL_COMMUNITY): Payer: Medicare HMO | Attending: Orthopedic Surgery | Admitting: Physical Therapy

## 2015-11-07 DIAGNOSIS — R2681 Unsteadiness on feet: Secondary | ICD-10-CM | POA: Diagnosis not present

## 2015-11-07 DIAGNOSIS — M24661 Ankylosis, right knee: Secondary | ICD-10-CM

## 2015-11-07 DIAGNOSIS — R600 Localized edema: Secondary | ICD-10-CM | POA: Insufficient documentation

## 2015-11-07 DIAGNOSIS — M6281 Muscle weakness (generalized): Secondary | ICD-10-CM | POA: Insufficient documentation

## 2015-11-07 DIAGNOSIS — R531 Weakness: Secondary | ICD-10-CM

## 2015-11-07 DIAGNOSIS — Z96651 Presence of right artificial knee joint: Secondary | ICD-10-CM | POA: Diagnosis not present

## 2015-11-07 NOTE — Therapy (Signed)
[place], [place], [address] Phone: [phone]   Fax:  [phone]  Physical Therapy Treatment  Patient Details  Name: Colleen Duran MRN: [id number] Date of Birth: 05-12-1946 Referring Provider: Vickey Huger, MD  Encounter Date: 11/07/2015      PT End of Session - 11/07/15 1140    Visit Number 2   Number of Visits 12   Date for PT Re-Evaluation 12/31/15   Authorization Type Medicare   Authorization Time Period G-code due by 10th visit, KX by 15 visit.    Authorization - Visit Number 2   Authorization - Number of Visits 10   PT Start Time 0845   PT Stop Time 0935   PT Time Calculation (min) 50 min   Activity Tolerance Patient tolerated treatment well   Behavior During Therapy WFL for tasks assessed/performed      Past Medical History  Diagnosis Date  . Hypertension   . High cholesterol   . Diabetes mellitus without complication ([place])     TYPE 2  . Arthritis     RA  . Wears glasses     Past Surgical History  Procedure Laterality Date  . Abdominal hysterectomy    . Bunionectomy      bilateral feet  . Cesarean section    . 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 Total knee arthroplasty Left 07/24/2014    Procedure: LEFT TOTAL KNEE ARTHROPLASTY;  Surgeon: Vickey Huger, MD;  Location: [facility];  Service: Orthopedics;  Laterality: Left;  . Colonoscopy N/A 05/11/2015    Procedure: COLONOSCOPY;  Surgeon: Danie Binder, MD;  [facility];  Service: Endoscopy;  Laterality: N/A;  2:15 PM  . Total knee arthroplasty Right 10/08/2015    Procedure: RIGHT TOTAL KNEE ARTHROPLASTY;  Surgeon: Vickey Huger, MD;  Location: [facility];  Service: Orthopedics;  Laterality: Right;    There were no vitals filed for this visit.  Visit Diagnosis:  Status post total right knee replacement  Decreased range of knee movement, right  Decreased strength  Gait instability  Localized edema       Subjective Assessment - 11/07/15 0920    Subjective PT states she is doing well today, minimal pain which is more like stiffness.   Currently in Pain? Yes   Pain Score 3    Pain Location Knee   Pain Orientation Right   Pain Descriptors / Indicators --  stiffness                         [facility] Adult PT Treatment/Exercise - 11/07/15 0936    Knee/Hip Exercises: Standing   Heel Raises 10 reps   Heel Raises Limitations toeraises 10 reps   Forward Lunges Both;10 reps   Forward Lunges Limitations 4" step   Lateral Step Up Right;10 reps;Hand Hold: 1;Step Height: 2"   Lateral Step Up Limitations 2" step   Forward Step Up Right;10 reps;Step Height: 2";Hand Hold: 1   Forward Step Up Limitations 2" step   Functional Squat 10 reps   Knee/Hip Exercises: Seated   Long Arc Quad Both;10 reps   Sit to [employer] 5 reps;without UE support   Knee/Hip Exercises: Supine   Quad Sets Right;10 reps   Heel Slides Right;5 reps   Manual Therapy   Manual Therapy Edema management   Manual therapy comments done at end of session separate from therex with LE's elevated  Edema Management retro massage to Rt LE foot to thigh                PT Education - 11/07/15 1141    Education provided Yes   Education Details given print out of exercise given at initial evaluation, copy of initial evaluation with goal review and educated in difference between TEDS and compression stockings   Person(s) Educated Patient   Methods Explanation;Demonstration;Handout   Comprehension Verbalized understanding;Returned demonstration;Tactile cues required;Need further instruction          PT Short Term Goals - 11/05/15 1051    PT SHORT TERM GOAL #1   Title Patient to be independent with HEP for ROM and strengthening.   Time 3   Period Weeks   Status New   PT SHORT TERM GOAL #2   Title Patient to have 105 degrees of Rt knee flexion for sitting on low surfaces.    Time 3   Period Weeks   Status New    PT SHORT TERM GOAL #3   Title Patient to demo 4-/5 strength with Rt knee extension for gait stability.   Time 3   Period Weeks   Status New           PT Long Term Goals - 11/05/15 1054    PT LONG TERM GOAL #1   Title Patient to be independent with advanced HEP for continuation of PT gains upon D/C.    Time 6   Period Weeks   Status New   PT LONG TERM GOAL #2   Title Patient to have 115 degrees of Rt knee flexion for lower surface sitting tolerance.    Time 6   Period Weeks   Status New   PT LONG TERM GOAL #3   Title Patient to demo 4/5 strength with rt knee extension for gait stability.    Time 6   Period Weeks   Status New   PT LONG TERM GOAL #4   Title Patient to have 0 degrees of Rt knee extension for gait stability.   Time 6   Period Weeks   Status New   PT LONG TERM GOAL #5   Title Patient to demo an imporvement in her FOTO score for improved overall function.    Time 6   Period Weeks   Status New               Plan - 11/07/15 1142    Clinical Impression Statement Initiated functional strengthening exercises for Rt LE.  Reviewed HEP and initial evaluation goals.  pt with significant weakness.  Attempted step ups with 4" step, however too weak to complete with Rt LE.  Noted swelling in Rt knee down into foot.  Educated on compression stockings and urged patient to acquire some to assist with swelling.  Complete retro massage with much improvment noted at end of session.     Pt will benefit from skilled therapeutic intervention in order to improve on the following deficits Abnormal gait;Decreased activity tolerance;Decreased range of motion;Decreased mobility;Decreased strength;Difficulty walking;Increased edema;Pain   Rehab Potential Good   PT Frequency 2x / week  Patient requesting 2X week due to transportation limitations   PT Duration 6 weeks   PT Treatment/Interventions ADLs/Self Care Home Management;Cryotherapy;Electrical Stimulation;Moist  Heat;Patient/family education;Balance training;Therapeutic exercise;Therapeutic activities;Functional mobility training;Stair training;Gait training;DME Instruction;Manual techniques   PT Next Visit Plan Continue Focus on knee flexion and knee extension exercises.Instruct in basic mat strengthening exercises for Rt LE and update HEP  to include these.   Consulted and Agree with Plan of Care Patient        Problem List Patient Active Problem List   Diagnosis Date Noted  . S/P total knee arthroplasty 10/08/2015  . Special screening for malignant neoplasms, colon   . S/P total knee replacement using cement 07/25/2014    Teena Irani, PTA/CLT [phone]  11/07/2015, 11:53 AM  [facility] [address] [place], [place], [address] Phone: [phone]   Fax:  [phone]  Name: Colleen Duran MRN: [id number] Date of Birth: [birth year]

## 2015-11-09 ENCOUNTER — Encounter (HOSPITAL_COMMUNITY): Payer: Medicare HMO

## 2015-11-12 ENCOUNTER — Ambulatory Visit (HOSPITAL_COMMUNITY): Payer: Medicare HMO | Admitting: Physical Therapy

## 2015-11-12 DIAGNOSIS — R531 Weakness: Secondary | ICD-10-CM

## 2015-11-12 DIAGNOSIS — M6281 Muscle weakness (generalized): Secondary | ICD-10-CM | POA: Diagnosis not present

## 2015-11-12 DIAGNOSIS — R2681 Unsteadiness on feet: Secondary | ICD-10-CM | POA: Diagnosis not present

## 2015-11-12 DIAGNOSIS — M24661 Ankylosis, right knee: Secondary | ICD-10-CM | POA: Diagnosis not present

## 2015-11-12 DIAGNOSIS — R600 Localized edema: Secondary | ICD-10-CM | POA: Diagnosis not present

## 2015-11-12 DIAGNOSIS — Z96651 Presence of right artificial knee joint: Secondary | ICD-10-CM

## 2015-11-12 NOTE — Patient Instructions (Signed)
  STRAIGHT LEG RAISE - SLR  While lying or sitting, raise up your leg with a straight knee.  Keep the opposite knee bent with the foot planted to the ground. Perform 2 sets of 10 repetitions. If your knee starts to bend, stop the exercise.

## 2015-11-12 NOTE — Therapy (Signed)
[place], [place], [address] Phone: [phone]   Fax:  [phone]  Physical Therapy Treatment  Patient Details  Name: Colleen Duran MRN: [id number] Date of Birth: 08/04/1946 Referring Provider: Vickey Huger, MD  Encounter Date: 11/12/2015      PT End of Session - 11/12/15 1154    Visit Number 3   Number of Visits 12   Date for PT Re-Evaluation 12/31/15   Authorization Type Medicare   Authorization Time Period G-code due by 10th visit, KX by 15 visit.    Authorization - Visit Number 3   Authorization - Number of Visits 10   PT Start Time [phone]  Due to pt arriving late.    PT Stop Time 0930   PT Time Calculation (min) 33 min   Activity Tolerance Patient tolerated treatment well   Behavior During Therapy WFL for tasks assessed/performed      Past Medical History  Diagnosis Date  . Hypertension   . High cholesterol   . Diabetes mellitus without complication ([place])     TYPE 2  . Arthritis     RA  . Wears glasses     Past Surgical History  Procedure Laterality Date  . Abdominal hysterectomy    . Bunionectomy      bilateral feet  . Cesarean section    . 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 Total knee arthroplasty Left 07/24/2014    Procedure: LEFT TOTAL KNEE ARTHROPLASTY;  Surgeon: Vickey Huger, MD;  Location: [facility];  Service: Orthopedics;  Laterality: Left;  . Colonoscopy N/A 05/11/2015    Procedure: COLONOSCOPY;  Surgeon: Danie Binder, MD;  [facility];  Service: Endoscopy;  Laterality: N/A;  2:15 PM  . Total knee arthroplasty Right 10/08/2015    Procedure: RIGHT TOTAL KNEE ARTHROPLASTY;  Surgeon: Vickey Huger, MD;  Location: [facility];  Service: Orthopedics;  Laterality: Right;    There were no vitals filed for this visit.  Visit Diagnosis:  Status post total right knee replacement  Decreased range of knee movement, right  Decreased strength  Gait  instability  Localized edema      Subjective Assessment - 11/12/15 0901    Subjective Pt reports she is doing good today and feels her pain is decreasing. She has been performing her HEP at home, but notes she feels a little stiff during the day.   Currently in Pain? Yes   Pain Score 2    Pain Orientation Right   Pain Descriptors / Indicators Sore   Pain Type Surgical pain   Pain Radiating Towards posterior knee    Pain Onset 1 to 4 weeks ago   Pain Frequency Intermittent   Aggravating Factors  walking, during HEP                         [facility] Adult PT Treatment/Exercise - 11/12/15 0001    Knee/Hip Exercises: Seated   Long Arc Quad AROM;Strengthening;Right;2 sets;10 reps   Long Arc Quad Weight 2 lbs.   Sit to [place] 5 reps;without UE support   Knee/Hip Exercises: Supine   Quad Sets Right;10 reps   Quad Sets Limitations with end range over pressure   Manual Therapy   Manual Therapy Joint mobilization   Manual therapy comments R tibfemoral PA joint mobs, grade III-IV  PT Education - 11/12/15 1152    Education provided Yes   Education Details reviewed HEP, addition of SLR with handout provided    Person(s) Educated Patient   Methods Explanation;Demonstration   Comprehension Verbalized understanding;Returned demonstration          PT Short Term Goals - 11/05/15 1051    PT SHORT TERM GOAL #1   Title Patient to be independent with HEP for ROM and strengthening.   Time 3   Period Weeks   Status New   PT SHORT TERM GOAL #2   Title Patient to have 105 degrees of Rt knee flexion for sitting on low surfaces.    Time 3   Period Weeks   Status New   PT SHORT TERM GOAL #3   Title Patient to demo 4-/5 strength with Rt knee extension for gait stability.   Time 3   Period Weeks   Status New           PT Long Term Goals - 11/05/15 1054    PT LONG TERM GOAL #1   Title Patient to be independent with advanced HEP for continuation of  PT gains upon D/C.    Time 6   Period Weeks   Status New   PT LONG TERM GOAL #2   Title Patient to have 115 degrees of Rt knee flexion for lower surface sitting tolerance.    Time 6   Period Weeks   Status New   PT LONG TERM GOAL #3   Title Patient to demo 4/5 strength with rt knee extension for gait stability.    Time 6   Period Weeks   Status New   PT LONG TERM GOAL #4   Title Patient to have 0 degrees of Rt knee extension for gait stability.   Time 6   Period Weeks   Status New   PT LONG TERM GOAL #5   Title Patient to demo an imporvement in her FOTO score for improved overall function.    Time 6   Period Weeks   Status New               Plan - 11/12/15 1156    Clinical Impression Statement Pt presents with limited knee extension ROM secondary to joint capsule tightness which is limiting her ability to ambulate safely without an AD and make optimal gains with her strengthening exercises. She repsonded well to joint mobilizations with decreased report of pain and stiffness by the end of today's session.    Pt will benefit from skilled therapeutic intervention in order to improve on the following deficits Abnormal gait;Decreased activity tolerance;Decreased range of motion;Decreased mobility;Decreased strength;Difficulty walking;Increased edema;Pain   Rehab Potential Good   PT Frequency 2x / week  Patient requesting 2X week due to transportation limitations   PT Duration 6 weeks   PT Treatment/Interventions ADLs/Self Care Home Management;Cryotherapy;Electrical Stimulation;Moist Heat;Patient/family education;Balance training;Therapeutic exercise;Therapeutic activities;Functional mobility training;Stair training;Gait training;DME Instruction;Manual techniques   PT Next Visit Plan Continue Focus on knee flexion and knee extension exercises.Instruct in basic mat strengthening exercises for Rt LE and update HEP to include these.   PT Home Exercise Plan Addition of SLR without  extensor lag, encouraged her to use ankle weight (2-3#) for LAQ at home.    Consulted and Agree with Plan of Care Patient        Problem List Patient Active Problem List   Diagnosis Date Noted  . S/P total knee arthroplasty 10/08/2015  . Special screening  for malignant neoplasms, colon   . S/P total knee replacement using cement 07/25/2014    12:05 PM,11/12/2015 Elly Modena PT, DPT [facility] [place] [address] [place], [place], [address] Phone: [phone]   Fax:  [phone]  Name: Colleen Duran MRN: [id number] Date of Birth: September 27, 1945

## 2015-11-14 ENCOUNTER — Encounter (HOSPITAL_COMMUNITY): Payer: Medicare HMO

## 2015-11-16 ENCOUNTER — Encounter (HOSPITAL_COMMUNITY): Payer: Medicare HMO

## 2015-11-19 ENCOUNTER — Ambulatory Visit (HOSPITAL_COMMUNITY): Payer: Medicare HMO | Admitting: Physical Therapy

## 2015-11-19 DIAGNOSIS — R600 Localized edema: Secondary | ICD-10-CM | POA: Diagnosis not present

## 2015-11-19 DIAGNOSIS — M24661 Ankylosis, right knee: Secondary | ICD-10-CM | POA: Diagnosis not present

## 2015-11-19 DIAGNOSIS — Z96651 Presence of right artificial knee joint: Secondary | ICD-10-CM

## 2015-11-19 DIAGNOSIS — R531 Weakness: Secondary | ICD-10-CM

## 2015-11-19 DIAGNOSIS — R2681 Unsteadiness on feet: Secondary | ICD-10-CM | POA: Diagnosis not present

## 2015-11-19 DIAGNOSIS — M6281 Muscle weakness (generalized): Secondary | ICD-10-CM | POA: Diagnosis not present

## 2015-11-19 NOTE — Therapy (Signed)
[place], [place], [address] Phone: [phone]   Fax:  [phone]  Physical Therapy Treatment  Patient Details  Name: Colleen Duran MRN: [id number] Date of Birth: May 17, 1946 Referring Provider: Vickey Huger, MD  Encounter Date: 11/19/2015      PT End of Session - 11/19/15 0938    Visit Number 4   Number of Visits 12   Date for PT Re-Evaluation 12/31/15   Authorization Type Medicare   Authorization Time Period G-code due by 10th visit, KX by 15 visit.    Authorization - Visit Number 4   Authorization - Number of Visits 10   PT Start Time 0845   PT Stop Time 0929   PT Time Calculation (min) 44 min   Activity Tolerance Patient tolerated treatment well   Behavior During Therapy [facility] for tasks assessed/performed      Past Medical History  Diagnosis Date  . Hypertension   . High cholesterol   . Diabetes mellitus without complication ([place])     TYPE 2  . Arthritis     RA  . Wears glasses     Past Surgical History  Procedure Laterality Date  . Abdominal hysterectomy    . Bunionectomy      bilateral feet  . Cesarean section    . 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 Total knee arthroplasty Left 07/24/2014    Procedure: LEFT TOTAL KNEE ARTHROPLASTY;  Surgeon: Vickey Huger, MD;  Location: [facility];  Service: Orthopedics;  Laterality: Left;  . Colonoscopy N/A 05/11/2015    Procedure: COLONOSCOPY;  Surgeon: Danie Binder, MD;  [facility];  Service: Endoscopy;  Laterality: N/A;  2:15 PM  . Total knee arthroplasty Right 10/08/2015    Procedure: RIGHT TOTAL KNEE ARTHROPLASTY;  Surgeon: Vickey Huger, MD;  Location: [facility];  Service: Orthopedics;  Laterality: Right;    There were no vitals filed for this visit.  Visit Diagnosis:  Status post total right knee replacement  Decreased range of knee movement, right  Decreased strength  Gait instability  Localized edema       Subjective Assessment - 11/19/15 0848    Subjective The knee is coming along, getting better. The hardest part is taking steps with it.    Currently in Pain? Yes   Pain Score 3    Pain Location Knee   Pain Orientation Right   Pain Descriptors / Indicators Tightness   Pain Type Surgical pain   Aggravating Factors  walking   Pain Relieving Factors keep moving, loosen it up            [facility] PT Assessment - 11/19/15 0001    AROM   Right Knee Extension 4   Right Knee Flexion 100                     [facility] Adult PT Treatment/Exercise - 11/19/15 0001    Ambulation/Gait   Ambulation/Gait Yes   Ambulation Distance (Feet) 120 Feet   Assistive device Small based quad cane   Gait Comments noted increased Rt LE hip external rotation and decreased stance time rt and decreased lt stride length. During gait training focused on; increased weight shift to Rt LE and increasing stride length on Lt.    Knee/Hip Exercises: Aerobic   Stationary Bike --   Recumbent Bike L0 X6 min, endrange stretches X 10 seconds last 4 cycles, unable do do  full revolutions. seat 13   Knee/Hip Exercises: Standing   Knee Flexion AROM;Strengthening;Right;10 reps;Both   Knee Flexion Limitations focus on weight shifts and knee flexion in standing. Cues needed to avoid hip flexion, UE support   Hip Flexion Stengthening;Both;1 set   Hip Flexion Limitations with UE support, focus on range and control;   Other Standing Knee Exercises 4 inch toe taps for weight shift and knee flexion, single UE support   Knee/Hip Exercises: Seated   Long Arc Quad Strengthening;Right;2 sets;10 reps   Knee/Hip Flexion 1X15   Other Seated Knee/Hip Exercises overpressure for max flexion by PT and then by pt. Educated on how to perform at home.    Manual Therapy   Manual therapy comments patellar glides sup/inf, scar mobilization and pt education on technique for scar mobs.                 PT Education - 11/19/15 [phone]     Education provided Yes   Education Details seated knee flexion stretch, gait corrections   Person(s) Educated Patient   Methods Explanation;Demonstration;Tactile cues;Verbal cues   Comprehension Verbalized understanding;Returned demonstration;Need further instruction          PT Short Term Goals - 11/05/15 1051    PT SHORT TERM GOAL #1   Title Patient to be independent with HEP for ROM and strengthening.   Time 3   Period Weeks   Status New   PT SHORT TERM GOAL #2   Title Patient to have 105 degrees of Rt knee flexion for sitting on low surfaces.    Time 3   Period Weeks   Status New   PT SHORT TERM GOAL #3   Title Patient to demo 4-/5 strength with Rt knee extension for gait stability.   Time 3   Period Weeks   Status New           PT Long Term Goals - 11/05/15 1054    PT LONG TERM GOAL #1   Title Patient to be independent with advanced HEP for continuation of PT gains upon D/C.    Time 6   Period Weeks   Status New   PT LONG TERM GOAL #2   Title Patient to have 115 degrees of Rt knee flexion for lower surface sitting tolerance.    Time 6   Period Weeks   Status New   PT LONG TERM GOAL #3   Title Patient to demo 4/5 strength with rt knee extension for gait stability.    Time 6   Period Weeks   Status New   PT LONG TERM GOAL #4   Title Patient to have 0 degrees of Rt knee extension for gait stability.   Time 6   Period Weeks   Status New   PT LONG TERM GOAL #5   Title Patient to demo an imporvement in her FOTO score for improved overall function.    Time 6   Period Weeks   Status New               Plan - 11/19/15 0943    Clinical Impression Statement Patient improving during PT sessions, Rt knee ROM 4-100 degrees. Working at [employer] on gait, especially even weight shifts and stride length. Reviewed seated knee flexion and technique for endrange holds. Pt. without complaint following session.    PT Next Visit Plan Continue Focus on knee flexion  and knee extension exercises. Continue with gait training as needed.    PT  Home Exercise Plan progress to standing marching, knee flexion if able to demo consistent technique.    Consulted and Agree with Plan of Care Patient        Problem List Patient Active Problem List   Diagnosis Date Noted  . S/P total knee arthroplasty 10/08/2015  . Special screening for malignant neoplasms, colon   . S/P total knee replacement using cement 07/25/2014   Cassell Clement, PT, CSCS Pager [phone]  11/19/2015, 9:48 AM  [place] [address] [place], [place], [address] Phone: [phone]   Fax:  [phone]  Name: Colleen Duran MRN: [id number] Date of Birth: October 28, 1945

## 2015-11-21 ENCOUNTER — Encounter (HOSPITAL_COMMUNITY): Payer: Medicare HMO | Admitting: Physical Therapy

## 2015-11-23 ENCOUNTER — Ambulatory Visit (HOSPITAL_COMMUNITY): Payer: Medicare HMO | Admitting: Physical Therapy

## 2015-11-23 DIAGNOSIS — Z96651 Presence of right artificial knee joint: Secondary | ICD-10-CM

## 2015-11-23 DIAGNOSIS — R2681 Unsteadiness on feet: Secondary | ICD-10-CM

## 2015-11-23 DIAGNOSIS — R531 Weakness: Secondary | ICD-10-CM

## 2015-11-23 DIAGNOSIS — R600 Localized edema: Secondary | ICD-10-CM | POA: Diagnosis not present

## 2015-11-23 DIAGNOSIS — M24661 Ankylosis, right knee: Secondary | ICD-10-CM

## 2015-11-23 DIAGNOSIS — M6281 Muscle weakness (generalized): Secondary | ICD-10-CM | POA: Diagnosis not present

## 2015-11-23 NOTE — Therapy (Signed)
[place], [place], [address] Phone: [phone]   Fax:  [phone]  Physical Therapy Treatment  Patient Details  Name: Colleen Duran MRN: [id number] Date of Birth: 08-30-1946 Referring Provider: Vickey Huger, MD  Encounter Date: 11/23/2015      PT End of Session - 11/23/15 0849    Visit Number 5   Number of Visits 12   Date for PT Re-Evaluation 12/31/15   Authorization Type Medicare   Authorization Time Period G-code due by 10th visit, KX by 15 visit.    Authorization - Visit Number 5   Authorization - Number of Visits 10   PT Start Time 0801   PT Stop Time 0844   PT Time Calculation (min) 43 min   Activity Tolerance Patient tolerated treatment well   Behavior During Therapy [facility] for tasks assessed/performed      Past Medical History  Diagnosis Date  . Hypertension   . High cholesterol   . Diabetes mellitus without complication ([place])     TYPE 2  . Arthritis     RA  . Wears glasses     Past Surgical History  Procedure Laterality Date  . Abdominal hysterectomy    . Bunionectomy      bilateral feet  . Cesarean section    . 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 Total knee arthroplasty Left 07/24/2014    Procedure: LEFT TOTAL KNEE ARTHROPLASTY;  Surgeon: Vickey Huger, MD;  Location: [facility];  Service: Orthopedics;  Laterality: Left;  . Colonoscopy N/A 05/11/2015    Procedure: COLONOSCOPY;  Surgeon: Danie Binder, MD;  [facility];  Service: Endoscopy;  Laterality: N/A;  2:15 PM  . Total knee arthroplasty Right 10/08/2015    Procedure: RIGHT TOTAL KNEE ARTHROPLASTY;  Surgeon: Vickey Huger, MD;  Location: [facility];  Service: Orthopedics;  Laterality: Right;    There were no vitals filed for this visit.  Visit Diagnosis:  Status post total right knee replacement  Decreased range of knee movement, right  Decreased strength  Gait instability  Localized edema       Subjective Assessment - 11/23/15 0803    Subjective Doing better, not much pain. Went to the doctor yesterday and to keep on going with therapy.    Currently in Pain? No/denies   Aggravating Factors  walking slightly irritating   Pain Relieving Factors staying moving                         [facility] Adult PT Treatment/Exercise - 11/23/15 0001    Knee/Hip Exercises: Stretches   Passive Hamstring Stretch 3 reps;60 seconds;Right   Knee/Hip Exercises: Aerobic   Recumbent Bike L0X 5 min, seat 13, assist for endrange stretch   Knee/Hip Exercises: Standing   Knee Flexion AROM;1 set;10 reps   Knee Flexion Limitations on 6 inch step    Forward Step Up Right;10 reps;Step Height: 2";Hand Hold: 2   Forward Step Up Limitations 2" step   Other Standing Knee Exercises 4 inch step toe taps bilateral 1X10   Knee/Hip Exercises: Seated   Long Arc Quad Strengthening;Right;2 sets;Weights   Long Arc Quad Weight 4 lbs.   Heel Slides AAROM;Right;1 set;10 reps;Limitations   Heel Slides Limitations 104 degrees flexion   Sit to Sand 10 reps;with UE support;Other (comment)  using hands on thighs   Knee/Hip Exercises: Supine   Quad Sets Strengthening;Right;2 sets;10  reps   Quad Sets Limitations knee extension 2 degrees   Manual Therapy   Manual therapy comments patellar mobs sup/inferior, medial/lateral                 PT Education - 11/23/15 0848    Education provided Yes   Education Details reviewed seated knee flexion stretch   Person(s) Educated Patient   Methods Explanation;Demonstration;Verbal cues;Tactile cues   Comprehension Verbalized understanding;Need further instruction          PT Short Term Goals - 11/05/15 1051    PT SHORT TERM GOAL #1   Title Patient to be independent with HEP for ROM and strengthening.   Time 3   Period Weeks   Status New   PT SHORT TERM GOAL #2   Title Patient to have 105 degrees of Rt knee flexion for sitting on low surfaces.    Time  3   Period Weeks   Status New   PT SHORT TERM GOAL #3   Title Patient to demo 4-/5 strength with Rt knee extension for gait stability.   Time 3   Period Weeks   Status New           PT Long Term Goals - 11/05/15 1054    PT LONG TERM GOAL #1   Title Patient to be independent with advanced HEP for continuation of PT gains upon D/C.    Time 6   Period Weeks   Status New   PT LONG TERM GOAL #2   Title Patient to have 115 degrees of Rt knee flexion for lower surface sitting tolerance.    Time 6   Period Weeks   Status New   PT LONG TERM GOAL #3   Title Patient to demo 4/5 strength with rt knee extension for gait stability.    Time 6   Period Weeks   Status New   PT LONG TERM GOAL #4   Title Patient to have 0 degrees of Rt knee extension for gait stability.   Time 6   Period Weeks   Status New   PT LONG TERM GOAL #5   Title Patient to demo an imporvement in her FOTO score for improved overall function.    Time 6   Period Weeks   Status New               Plan - 11/23/15 0849    Clinical Impression Statement Patient is making gradual progress with PT regarding strength and ROM. Rt knee ROM 2-104 degrees and pt able to perform 10 sit/stand transfers. The patient does continue to have significant limitations with ROM and the transition of gained motion and functional mobility. Overall the patient remains appropriate for continued PT sessions.    PT Next Visit Plan Continue to progress ROM and strength, work on gait and knee flexion with swing phase.    PT Home Exercise Plan progress to standing marching, knee flexion if able to demo consistent technique.    Consulted and Agree with Plan of Care Patient        Problem List Patient Active Problem List   Diagnosis Date Noted  . S/P total knee arthroplasty 10/08/2015  . Special screening for malignant neoplasms, colon   . S/P total knee replacement using cement 07/25/2014    Cassell Clement, PT, CSCS Pager [phone]  11/23/2015, 8:55 AM  [facility],  [place], [address] Phone: [phone]   Fax:  [phone]  Name: Colleen Duran MRN: [id number] Date of Birth: 1946-01-10

## 2015-11-26 ENCOUNTER — Ambulatory Visit (HOSPITAL_COMMUNITY): Payer: Medicare HMO | Admitting: Physical Therapy

## 2015-11-26 DIAGNOSIS — R531 Weakness: Secondary | ICD-10-CM

## 2015-11-26 DIAGNOSIS — M6281 Muscle weakness (generalized): Secondary | ICD-10-CM | POA: Diagnosis not present

## 2015-11-26 DIAGNOSIS — R600 Localized edema: Secondary | ICD-10-CM

## 2015-11-26 DIAGNOSIS — Z96651 Presence of right artificial knee joint: Secondary | ICD-10-CM | POA: Diagnosis not present

## 2015-11-26 DIAGNOSIS — R2681 Unsteadiness on feet: Secondary | ICD-10-CM | POA: Diagnosis not present

## 2015-11-26 DIAGNOSIS — M24661 Ankylosis, right knee: Secondary | ICD-10-CM

## 2015-11-26 NOTE — Therapy (Signed)
[facility] [place], [place], [address] Phone: [phone]   Fax:  [phone]  Physical Therapy Treatment  Patient Details  Name: Colleen Duran MRN: [id number] Date of Birth: [birth year] Referring Provider: Vickey Huger, MD  Encounter Date: 11/26/2015      PT End of Session - 11/26/15 0936    Visit Number 6   Number of Visits 12   Date for PT Re-Evaluation 12/31/15   Authorization Type Medicare   Authorization Time Period G-code due by 10th visit, KX by 15 visit.    Authorization - Visit Number 6   Authorization - Number of Visits 10   PT Start Time [id number]   PT Stop Time [phone]   PT Time Calculation (min) 46 min   Activity Tolerance Patient tolerated treatment well   Behavior During Therapy [facility] for tasks assessed/performed      Past Medical History  Diagnosis Date  . Hypertension   . High cholesterol   . Diabetes mellitus without complication ([place])     TYPE 2  . Arthritis     RA  . Wears glasses     Past Surgical History  Procedure Laterality Date  . Abdominal hysterectomy    . Bunionectomy      bilateral feet  . Cesarean section    . 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 Total knee arthroplasty Left 07/24/2014    Procedure: LEFT TOTAL KNEE ARTHROPLASTY;  Surgeon: Vickey Huger, MD;  Location: [facility];  Service: Orthopedics;  Laterality: Left;  . Colonoscopy N/A 05/11/2015    Procedure: COLONOSCOPY;  Surgeon: Danie Binder, MD;  [facility];  Service: Endoscopy;  Laterality: N/A;  2:15 PM  . Total knee arthroplasty Right 10/08/2015    Procedure: RIGHT TOTAL KNEE ARTHROPLASTY;  Surgeon: Vickey Huger, MD;  Location: [facility];  Service: Orthopedics;  Laterality: Right;    There were no vitals filed for this visit.  Visit Diagnosis:  Status post total right knee replacement  Decreased range of knee movement, right  Decreased strength  Gait instability  Localized edema       Subjective Assessment - 11/26/15 1012    Subjective Pt states her knee is stiff but not really having pain.  States she sometimes gets nauseated from her morning meds.   Currently in Pain? No/denies                         [facility] Adult PT Treatment/Exercise - 11/26/15 0001    Knee/Hip Exercises: Aerobic   Recumbent Bike L0X 5 min, seat 13, assist for endrange stretch   Knee/Hip Exercises: Standing   Knee Flexion AROM;1 set;10 reps   Knee Flexion Limitations on 4 inch step    Forward Lunges Both;10 reps   Forward Lunges Limitations 4" step   Lateral Step Up Right;10 reps;Hand Hold: 1;Step Height: 4"   Lateral Step Up Limitations 4"   Forward Step Up Right;10 reps;Hand Hold: 2;Step Height: 4"   Forward Step Up Limitations 4"   Functional Squat 10 reps   Other Standing Knee Exercises 6 inch step toe taps bilateral 1X10   Knee/Hip Exercises: Seated   Heel Slides AAROM;Right;1 set;10 reps;Limitations   Heel Slides Limitations 105 flexion   Sit to Sand 10 reps;without UE support   Knee/Hip Exercises: Supine   Heel Slides Right;10 reps   Manual Therapy   Manual Therapy Myofascial release  Manual therapy comments done prior to [facility] in supine seperate from therex   Myofascial Release to decreased adhesions with main focus on distal end of scar                  PT Short Term Goals - 11/05/15 1051    PT SHORT TERM GOAL #1   Title Patient to be independent with HEP for ROM and strengthening.   Time 3   Period Weeks   Status New   PT SHORT TERM GOAL #2   Title Patient to have 105 degrees of Rt knee flexion for sitting on low surfaces.    Time 3   Period Weeks   Status New   PT SHORT TERM GOAL #3   Title Patient to demo 4-/5 strength with Rt knee extension for gait stability.   Time 3   Period Weeks   Status New           PT Long Term Goals - 11/05/15 1054    PT LONG TERM GOAL #1   Title Patient to be independent with advanced HEP for  continuation of PT gains upon D/C.    Time 6   Period Weeks   Status New   PT LONG TERM GOAL #2   Title Patient to have 115 degrees of Rt knee flexion for lower surface sitting tolerance.    Time 6   Period Weeks   Status New   PT LONG TERM GOAL #3   Title Patient to demo 4/5 strength with rt knee extension for gait stability.    Time 6   Period Weeks   Status New   PT LONG TERM GOAL #4   Title Patient to have 0 degrees of Rt knee extension for gait stability.   Time 6   Period Weeks   Status New   PT LONG TERM GOAL #5   Title Patient to demo an imporvement in her FOTO score for improved overall function.    Time 6   Period Weeks   Status New               Plan - 11/26/15 [id number]    Clinical Impression Statement Pt required one seated rest break during session as she c/o being hot and nauseated.  Pt reported was due to her morning meds.  Pt all better after brief rest and water break. Decreased knee terminal flexion to 4" box as 6" was too hight and limiting ROM.  PT also c/o cramping in hamstring using 6" step.  Added forward lunges and standing knee flexion  prior to bike to encourage increased knee flexion.  Noted adhesions and scar tissue, especially at distal end of incision. Added and completed myofascial techiques to improve mobilty in this area.  105 degrees AAROM flexion achieved at end of session in supine.     PT Next Visit Plan Continue to progress ROM and strength, work on gait and knee flexion with swing phase.    PT Home Exercise Plan progress to standing marching, knee flexion if able to demo consistent technique.    Consulted and Agree with Plan of Care Patient        Problem List Patient Active Problem List   Diagnosis Date Noted  . S/P total knee arthroplasty 10/08/2015  . Special screening for malignant neoplasms, colon   . S/P total knee replacement using cement 07/25/2014    Teena Irani, PTA/CLT [phone]  11/26/2015, 10:43 AM  [facility] [address] [place], [place], [address] Phone: [phone]   Fax:  [phone]  Name: MAKARI SIEK MRN: [id number] Date of Birth: 1945/11/23

## 2015-11-28 ENCOUNTER — Encounter (HOSPITAL_COMMUNITY): Payer: Self-pay

## 2015-11-28 ENCOUNTER — Ambulatory Visit (HOSPITAL_COMMUNITY): Payer: Medicare HMO

## 2015-11-28 DIAGNOSIS — R2681 Unsteadiness on feet: Secondary | ICD-10-CM

## 2015-11-28 DIAGNOSIS — M6281 Muscle weakness (generalized): Secondary | ICD-10-CM | POA: Diagnosis not present

## 2015-11-28 DIAGNOSIS — R600 Localized edema: Secondary | ICD-10-CM | POA: Diagnosis not present

## 2015-11-28 DIAGNOSIS — M24661 Ankylosis, right knee: Secondary | ICD-10-CM | POA: Diagnosis not present

## 2015-11-28 DIAGNOSIS — R531 Weakness: Secondary | ICD-10-CM

## 2015-11-28 DIAGNOSIS — Z96651 Presence of right artificial knee joint: Secondary | ICD-10-CM | POA: Diagnosis not present

## 2015-11-28 NOTE — Therapy (Signed)
[place], [place], [address] Phone: [phone]   Fax:  [phone]  Physical Therapy Treatment  Patient Details  Name: Colleen Duran MRN: [id number] Date of Birth: November 13, 1945 Referring Provider: Vickey Huger, MD  Encounter Date: 11/28/2015      PT End of Session - 11/28/15 0938    Visit Number 7   Number of Visits 12   Date for PT Re-Evaluation 12/31/15   Authorization Type Medicare   Authorization Time Period G-code due by 10th visit, KX by 15 visit.    Authorization - Visit Number 7   Authorization - Number of Visits 10   PT Start Time 0932   PT Stop Time 1020   PT Time Calculation (min) 48 min   Activity Tolerance Patient tolerated treatment well   Behavior During Therapy [facility] for tasks assessed/performed      Past Medical History  Diagnosis Date  . Hypertension   . High cholesterol   . Diabetes mellitus without complication ([place])     TYPE 2  . Arthritis     RA  . Wears glasses     Past Surgical History  Procedure Laterality Date  . Abdominal hysterectomy    . Bunionectomy      bilateral feet  . Cesarean section    . 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 Total knee arthroplasty Left 07/24/2014    Procedure: LEFT TOTAL KNEE ARTHROPLASTY;  Surgeon: Vickey Huger, MD;  Location: [facility];  Service: Orthopedics;  Laterality: Left;  . Colonoscopy N/A 05/11/2015    Procedure: COLONOSCOPY;  Surgeon: Danie Binder, MD;  [facility];  Service: Endoscopy;  Laterality: N/A;  2:15 PM  . Total knee arthroplasty Right 10/08/2015    Procedure: RIGHT TOTAL KNEE ARTHROPLASTY;  Surgeon: Vickey Huger, MD;  Location: [facility];  Service: Orthopedics;  Laterality: Right;    There were no vitals filed for this visit.  Visit Diagnosis:  Status post total right knee replacement  Decreased range of knee movement, right  Decreased strength  Gait instability  Localized edema       Subjective Assessment - 11/28/15 0940    Subjective Pt reported "my knee feels like it's moving better". Pt denied pain upon arrival and stated that her pain levels have been minor since last week. She further stated that "walking is improving, too" with improved knee mobility noted.   Limitations Standing;Walking;House hold activities   How long can you sit comfortably? unlimited   How long can you stand comfortably? 15 minutes   How long can you walk comfortably? 15 minutes   Patient Stated Goals Be more active and more independent   Currently in Pain? No/denies   Pain Score 0-No pain  R knee pain has ranged between a 0-5/10 on a VAS since last PT visit.    Pain Location Knee   Pain Orientation Right   Pain Descriptors / Indicators Tightness   Pain Type Surgical pain   Pain Onset More than a month ago   Pain Frequency Intermittent   Aggravating Factors  max R knee flex    Pain Relieving Factors ROM ther ex, changing positions, ice, and rest    Effect of Pain on Daily Activities limits her ability to stand/walk for long periods of time  [facility] Adult PT Treatment/Exercise - 11/28/15 0001    Ambulation/Gait   Ambulation/Gait Yes   Ambulation/Gait Assistance 5: Supervision   Ambulation Distance (Feet) 380 Feet   Assistive device Small based quad cane;Straight cane   Gait Pattern Decreased arm swing - right;Decreased arm swing - left  Decreased R knee flexion during swing phase; slow cadence   Ambulation Surface Level   Gait velocity decreased   Gait Comments decreased R heel strike and R TKE at initial contact    Knee/Hip Exercises: Stretches   Passive Hamstring Stretch 3 reps;60 seconds;Right   Knee/Hip Exercises: Standing   Knee Flexion AROM;1 set;10 reps   Knee Flexion Limitations on 4 inch step    Lateral Step Up Right;1 set;15 reps;Step Height: 4";Hand Hold: 2   Lateral Step Up Limitations verbal cues provided to avoid trunk flexion     Forward Step Up Right;1 set;15 reps;Hand Hold: 2;Step Height: 4"   Forward Step Up Limitations verbal cues provided to avoid trunk flexion    Functional Squat 2 sets;10 reps;Other (comment)  Partial squats    Functional Squat Limitations with UE support   Gait Training Completed indoor gait training on level terrain for 380 feet with initial use of quad cane with progression to [facility] with focus on increased heel to toe seq, increased R knee flexion at heel off and during swing phase, and increased cadence.    Knee/Hip Exercises: Seated   Sit to Sand 10 reps;without UE support   Knee/Hip Exercises: Supine   Quad Sets Strengthening;Right;2 sets;10 reps   Heel Slides Right;AROM;AAROM;20 reps;1 set   Heel Slides Limitations 10 reps completed with strap   Manual Therapy   Manual Therapy Joint mobilization;Passive ROM;Soft tissue mobilization   Manual therapy comments done prior to PROM/AAROM in supine seperate from therex   Joint Mobilization R patellar S<>I mobs using graddes I-III in supine    Soft tissue mobilization Scar mobs of R knee surgical scar x 6 minutes    Passive ROM R knee PROM into flexion and extension x 10 reps with 5 sec hold at end-range                 PT Education - 11/28/15 0946    Education provided Yes   Education Details HEP, gait pattern/sequence with quad cane and SPC, recommendation to acquire a SPC   Person(s) Educated Patient   Methods Explanation;Demonstration;Verbal cues   Comprehension Verbalized understanding;Returned demonstration          PT Short Term Goals - 11/05/15 1051    PT SHORT TERM GOAL #1   Title Patient to be independent with HEP for ROM and strengthening.   Time 3   Period Weeks   Status New   PT SHORT TERM GOAL #2   Title Patient to have 105 degrees of Rt knee flexion for sitting on low surfaces.    Time 3   Period Weeks   Status New   PT SHORT TERM GOAL #3   Title Patient to demo 4-/5 strength with Rt knee extension for  gait stability.   Time 3   Period Weeks   Status New           PT Long Term Goals - 11/05/15 1054    PT LONG TERM GOAL #1   Title Patient to be independent with advanced HEP for continuation of PT gains upon D/C.    Time 6   Period Weeks   Status New   PT LONG  TERM GOAL #2   Title Patient to have 115 degrees of Rt knee flexion for lower surface sitting tolerance.    Time 6   Period Weeks   Status New   PT LONG TERM GOAL #3   Title Patient to demo 4/5 strength with rt knee extension for gait stability.    Time 6   Period Weeks   Status New   PT LONG TERM GOAL #4   Title Patient to have 0 degrees of Rt knee extension for gait stability.   Time 6   Period Weeks   Status New   PT LONG TERM GOAL #5   Title Patient to demo an imporvement in her FOTO score for improved overall function.    Time 6   Period Weeks   Status New               Plan - 11/28/15 [id number]    Clinical Impression Statement PT tx focused on R AAROM/PROM/AROM ther ex, CKC quad/glute/HS strengthening, gait training with quad cane/SPC, and manual therapy techniques to improve scar mobility and patellar mobility. Pt required occasional cues for proper technique and to avoid compensatory movements while completing step-ups and squats. Improved technique assessed with subsequent reps. Completed gait training with quad cane and SPC with improved balance and stride length assessed with use of SPC. Therapist recommended pt to acquire a SPC in order to improve gait quality. Pt was in agreement and noted that she felt "better" when ambulating with a SPC versus a quad cane. Verbal cues required for increased R knee flexion during swing phase and increased heel strikes. Pt demo with improved gait pattern once cues provided. She continues to present with slow cadence and decreased reciprocal arm swing. R knee AAROM was measured 3-108 degrees at the end of PT tx. R knee pain remained at a 0/10 on a VAS at the end of PT with  minor complaints of LE fatigue. Pt is making great progress towards stated goals with improved R knee ROM and improved gait pattern/sequence and would benefit from continued skilled PT to address remaining strength, ROM, balance, and gait deficits. Continue with current POC.    Pt will benefit from skilled therapeutic intervention in order to improve on the following deficits Abnormal gait;Decreased activity tolerance;Decreased range of motion;Decreased mobility;Decreased strength;Difficulty walking;Increased edema;Pain   Rehab Potential Good   PT Frequency 2x / week   PT Duration 6 weeks   PT Treatment/Interventions ADLs/Self Care Home Management;Cryotherapy;Electrical Stimulation;Moist Heat;Patient/family education;Balance training;Therapeutic exercise;Therapeutic activities;Functional mobility training;Stair training;Gait training;DME Instruction;Manual techniques   PT Next Visit Plan Continue to progress ROM and strength, work on gait and knee flexion with swing phase with use of SPC    PT Home Exercise Plan progress to standing marching, knee flexion if able to demo consistent technique.    Consulted and Agree with Plan of Care Patient        Problem List Patient Active Problem List   Diagnosis Date Noted  . S/P total knee arthroplasty 10/08/2015  . Special screening for malignant neoplasms, colon   . S/P total knee replacement using cement 07/25/2014    Garen Lah, PT, DPT   11/28/2015, 10:45 AM  [facility] [address] [place], [place], [address] Phone: [phone]   Fax:  [phone]  Name: Colleen Duran MRN: [id number] Date of Birth: Sep 06, 1946

## 2015-11-30 ENCOUNTER — Encounter (HOSPITAL_COMMUNITY): Payer: Medicare HMO

## 2015-12-03 ENCOUNTER — Encounter (HOSPITAL_COMMUNITY): Payer: Medicare HMO | Admitting: Physical Therapy

## 2015-12-05 ENCOUNTER — Encounter (HOSPITAL_COMMUNITY): Payer: Medicare HMO | Admitting: Physical Therapy

## 2015-12-05 ENCOUNTER — Ambulatory Visit (HOSPITAL_COMMUNITY): Payer: Medicare HMO | Admitting: Physical Therapy

## 2015-12-05 DIAGNOSIS — R2681 Unsteadiness on feet: Secondary | ICD-10-CM

## 2015-12-05 DIAGNOSIS — Z96651 Presence of right artificial knee joint: Secondary | ICD-10-CM

## 2015-12-05 DIAGNOSIS — R600 Localized edema: Secondary | ICD-10-CM

## 2015-12-05 DIAGNOSIS — M24661 Ankylosis, right knee: Secondary | ICD-10-CM

## 2015-12-05 DIAGNOSIS — R531 Weakness: Secondary | ICD-10-CM

## 2015-12-05 NOTE — Therapy (Signed)
[facility] [address] [place], [place], [address] Phone: [phone]   Fax:  [phone]  Physical Therapy Treatment  Patient Details  Name: Colleen Duran MRN: [id number] Date of Birth: 07-23-1946 Referring Provider: Vickey Huger, MD  Encounter Date: 12/05/2015      PT End of Session - 12/05/15 1150    Visit Number 8   Number of Visits 12   Date for PT Re-Evaluation 12/31/15   Authorization Type Medicare   Authorization Time Period G-code due by 10th visit, KX by 15 visit.    Authorization - Visit Number 8   Authorization - Number of Visits 10   PT Start Time [id number]   PT Stop Time 1148   PT Time Calculation (min) 40 min   Activity Tolerance Patient tolerated treatment well      Past Medical History  Diagnosis Date  . Hypertension   . High cholesterol   . Diabetes mellitus without complication ([place])     TYPE 2  . Arthritis     RA  . Wears glasses     Past Surgical History  Procedure Laterality Date  . Abdominal hysterectomy    . Bunionectomy      bilateral feet  . Cesarean section    . 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 Total knee arthroplasty Left 07/24/2014    Procedure: LEFT TOTAL KNEE ARTHROPLASTY;  Surgeon: Vickey Huger, MD;  Location: [facility];  Service: Orthopedics;  Laterality: Left;  . Colonoscopy N/A 05/11/2015    Procedure: COLONOSCOPY;  Surgeon: Danie Binder, MD;  [facility];  Service: Endoscopy;  Laterality: N/A;  2:15 PM  . Total knee arthroplasty Right 10/08/2015    Procedure: RIGHT TOTAL KNEE ARTHROPLASTY;  Surgeon: Vickey Huger, MD;  Location: [facility];  Service: Orthopedics;  Laterality: Right;    There were no vitals filed for this visit.  Visit Diagnosis:  Status post total right knee replacement  Decreased range of knee movement, right  Decreased strength  Gait instability  Localized edema      Subjective Assessment - 12/05/15 1109    Subjective Pt statees that  she is still having a difficult time bending her knee.  She is doing her exercises once a day   Currently in Pain? No/denies                [facility] Adult PT Treatment/Exercise - 12/05/15 0001    Knee/Hip Exercises: Stretches   Knee: Self-Stretch to increase Flexion Left;5 reps   Gastroc Stretch Both;3 reps;30 seconds   Gastroc Stretch Limitations slant board    Knee/Hip Exercises: Standing   Heel Raises Both;10 reps   Forward Lunges Right;10 reps   Forward Lunges Limitations 4"   Terminal Knee Extension Limitations 10   Lateral Step Up Right;1 set;15 reps;Step Height: 4";Hand Hold: 2   Lateral Step Up Limitations verbal cues provided to avoid trunk flexion    Functional Squat 10 reps   SLS x 5  B    Knee/Hip Exercises: Seated   Long Arc Quad Strengthening;Right;10 reps   Knee/Hip Exercises: Supine   Quad Sets Right;10 reps   Heel Slides 10 reps   Terminal Knee Extension Right;10 reps   Knee Extension PROM;Right;3 sets   Knee Flexion Limitations 110   Manual Therapy   Manual Therapy Edema management   Manual therapy comments done seperate from all other aspects of treatment.    Joint Mobilization  R patellar S<>I mobs using graddes I-III in supine    Passive ROM R knee PROM into flexion and extension x 10 reps with 5 sec hold at end-range                 PT Education - 12/05/15 1150    Education provided Yes   Education Details The importance of completing balance exercises daily for the rest of pt life.    Person(s) Educated Patient   Methods Explanation   Comprehension Verbalized understanding          PT Short Term Goals - 11/05/15 1051    PT SHORT TERM GOAL #1   Title Patient to be independent with HEP for ROM and strengthening.   Time 3   Period Weeks   Status New   PT SHORT TERM GOAL #2   Title Patient to have 105 degrees of Rt knee flexion for sitting on low surfaces.    Time 3   Period Weeks   Status New   PT SHORT TERM GOAL #3   Title  Patient to demo 4-/5 strength with Rt knee extension for gait stability.   Time 3   Period Weeks   Status New           PT Long Term Goals - 11/05/15 1054    PT LONG TERM GOAL #1   Title Patient to be independent with advanced HEP for continuation of PT gains upon D/C.    Time 6   Period Weeks   Status New   PT LONG TERM GOAL #2   Title Patient to have 115 degrees of Rt knee flexion for lower surface sitting tolerance.    Time 6   Period Weeks   Status New   PT LONG TERM GOAL #3   Title Patient to demo 4/5 strength with rt knee extension for gait stability.    Time 6   Period Weeks   Status New   PT LONG TERM GOAL #4   Title Patient to have 0 degrees of Rt knee extension for gait stability.   Time 6   Period Weeks   Status New   PT LONG TERM GOAL #5   Title Patient to demo an imporvement in her FOTO score for improved overall function.    Time 6   Period Weeks   Status New               Plan - 12/05/15 1151    Clinical Impression Statement Pt ROM imiproving but is inhibited by edema therefore decongestive techniuqes were complete at the end of the treatment.  Pt is ambulating with single point cane and will occasionally go without cane in her home.  Pt needs therapist faciliation with standing exercises for safety.  Added balance and terminal extension activtity.    Pt will benefit from skilled therapeutic intervention in order to improve on the following deficits Abnormal gait;Decreased activity tolerance;Decreased range of motion;Decreased mobility;Decreased strength;Difficulty walking;Increased edema;Pain   PT Home Exercise Plan continue with functional strengthening pt is not ready to increase in step height at this time.          Problem List Patient Active Problem List   Diagnosis Date Noted  . S/P total knee arthroplasty 10/08/2015  . Special screening for malignant neoplasms, colon   . S/P total knee replacement using cement 07/25/2014    Rayetta Humphrey, PT CLT [phone] 12/05/2015, 11:54 AM  [facility]  [address] [place], [place], [address] Phone: [phone]   Fax:  [phone]  Name: Colleen Duran MRN: [id number] Date of Birth: [birth year]

## 2015-12-06 ENCOUNTER — Ambulatory Visit (HOSPITAL_COMMUNITY): Payer: Medicare HMO | Admitting: Physical Therapy

## 2015-12-06 DIAGNOSIS — M24661 Ankylosis, right knee: Secondary | ICD-10-CM | POA: Diagnosis not present

## 2015-12-06 DIAGNOSIS — R2681 Unsteadiness on feet: Secondary | ICD-10-CM

## 2015-12-06 DIAGNOSIS — R531 Weakness: Secondary | ICD-10-CM

## 2015-12-06 DIAGNOSIS — M6281 Muscle weakness (generalized): Secondary | ICD-10-CM | POA: Diagnosis not present

## 2015-12-06 DIAGNOSIS — Z96651 Presence of right artificial knee joint: Secondary | ICD-10-CM

## 2015-12-06 DIAGNOSIS — R600 Localized edema: Secondary | ICD-10-CM

## 2015-12-06 NOTE — Therapy (Signed)
[facility] [place], [place], [address] Phone: [phone]   Fax:  [phone]  Physical Therapy Treatment  Patient Details  Name: Colleen Duran MRN: [id number] Date of Birth: [birth year] Referring Provider: Vickey Huger, MD  Encounter Date: 12/06/2015      PT End of Session - 12/06/15 1044    Visit Number 9   Number of Visits 12   Date for PT Re-Evaluation 12/31/15   Authorization Time Period G-code due by 10th visit, KX by 15 visit.    Authorization - Visit Number 9   Authorization - Number of Visits 10   PT Start Time 1012   PT Stop Time 1055   PT Time Calculation (min) 43 min   Activity Tolerance Patient tolerated treatment well   Behavior During Therapy [facility]FL for tasks assessed/performed      Past Medical History  Diagnosis Date  . Hypertension   . High cholesterol   . Diabetes mellitus without complication ([place])     TYPE 2  . Arthritis     RA  . Wears glasses     Past Surgical History  Procedure Laterality Date  . Abdominal hysterectomy    . Bunionectomy      bilateral feet  . Cesarean section    . 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 Total knee arthroplasty Left 07/24/2014    Procedure: LEFT TOTAL KNEE ARTHROPLASTY;  Surgeon: Vickey Huger, MD;  Location: [facility];  Service: Orthopedics;  Laterality: Left;  . Colonoscopy N/A 05/11/2015    Procedure: COLONOSCOPY;  Surgeon: Danie Binder, MD;  [facility];  Service: Endoscopy;  Laterality: N/A;  2:15 PM  . Total knee arthroplasty Right 10/08/2015    Procedure: RIGHT TOTAL KNEE ARTHROPLASTY;  Surgeon: Vickey Huger, MD;  Location: [facility];  Service: Orthopedics;  Laterality: Right;    There were no vitals filed for this visit.  Visit Diagnosis:  Status post total right knee replacement  Decreased range of knee movement, right  Decreased strength  Gait instability  Localized edema      Subjective Assessment - 12/06/15 1013     Subjective Pt states that she can tell that she is getting better everyday.    Currently in Pain? No/denies                         [facility] Adult PT Treatment/Exercise - 12/06/15 0001    Knee/Hip Exercises: Stretches   Gastroc Stretch Both;3 reps;30 seconds   Gastroc Stretch Limitations slant board    Knee/Hip Exercises: Standing   Heel Raises Both;15 reps   Forward Lunges Right;10 reps   Forward Lunges Limitations 4"   Terminal Knee Extension Limitations 15   Lateral Step Up Right;1 set;10 reps;Hand Hold: 1;Step Height: 2"   Lateral Step Up Limitations verbal cues provided to avoid trunk flexion    Forward Step Up Right;1 set;15 reps;Hand Hold: 1;Step Height: 4"   Step Down Right;5 reps;Step Height: 2"   Functional Squat 15 reps   SLS x 5  B    Knee/Hip Exercises: Supine   Quad Sets Right;15 reps   Heel Slides 10 reps   Terminal Knee Extension Right;15 reps   Knee Flexion Limitations 110   Manual Therapy   Manual Therapy Edema management   Manual therapy comments done seperate from all other aspects of treatment.    Joint Mobilization R patellar S<>I  mobs using graddes I-III in supine    Passive ROM R knee PROM into flexion and extension x 10 reps with 5 sec hold at end-range                 PT Education - 12/06/15 1150    Education provided Yes   Education Details Given information on compression stockings .    Person(s) Educated Patient   Methods Explanation   Comprehension Verbalized understanding          PT Short Term Goals - 11/05/15 1051    PT SHORT TERM GOAL #1   Title Patient to be independent with HEP for ROM and strengthening.   Time 3   Period Weeks   Status New   PT SHORT TERM GOAL #2   Title Patient to have 105 degrees of Rt knee flexion for sitting on low surfaces.    Time 3   Period Weeks   Status New   PT SHORT TERM GOAL #3   Title Patient to demo 4-/5 strength with Rt knee extension for gait stability.   Time 3    Period Weeks   Status New           PT Long Term Goals - 11/05/15 1054    PT LONG TERM GOAL #1   Title Patient to be independent with advanced HEP for continuation of PT gains upon D/C.    Time 6   Period Weeks   Status New   PT LONG TERM GOAL #2   Title Patient to have 115 degrees of Rt knee flexion for lower surface sitting tolerance.    Time 6   Period Weeks   Status New   PT LONG TERM GOAL #3   Title Patient to demo 4/5 strength with rt knee extension for gait stability.    Time 6   Period Weeks   Status New   PT LONG TERM GOAL #4   Title Patient to have 0 degrees of Rt knee extension for gait stability.   Time 6   Period Weeks   Status New   PT LONG TERM GOAL #5   Title Patient to demo an imporvement in her FOTO score for improved overall function.    Time 6   Period Weeks   Status New               Plan - 12/06/15 1045    Clinical Impression Statement Pt limiting factors ore edema, balance and strength.  Pt ROM is limited by edema but progressing.  Added step down to program needing a 2" step.  Decreased to one hand hold on forward and lateral step ups which increased the difficulty for pt as she was not able to push self up with UE, needed to decrease to 2" step on lateral step ups    PT Next Visit Plan Continue to progress ROM and functional strength, and balance, G-codes and reassessment is due.         Problem List Patient Active Problem List   Diagnosis Date Noted  . S/P total knee arthroplasty 10/08/2015  . Special screening for malignant neoplasms, colon   . S/P total knee replacement using cement 07/25/2014   Rayetta Humphrey, PT CLT [phone] 12/06/2015, 10:57 AM  [facility] [address] [place], [place], [address] Phone: [phone]   Fax:  [phone]  Name: Colleen Duran MRN: [id number] Date of Birth: [birth year]

## 2015-12-07 ENCOUNTER — Ambulatory Visit (HOSPITAL_COMMUNITY): Payer: Medicare HMO | Admitting: Physical Therapy

## 2015-12-11 ENCOUNTER — Ambulatory Visit (HOSPITAL_COMMUNITY): Payer: Medicare HMO | Attending: Orthopedic Surgery | Admitting: Physical Therapy

## 2015-12-11 DIAGNOSIS — M6281 Muscle weakness (generalized): Secondary | ICD-10-CM | POA: Diagnosis not present

## 2015-12-11 DIAGNOSIS — Z96651 Presence of right artificial knee joint: Secondary | ICD-10-CM | POA: Diagnosis not present

## 2015-12-11 DIAGNOSIS — R600 Localized edema: Secondary | ICD-10-CM | POA: Diagnosis not present

## 2015-12-11 DIAGNOSIS — M24661 Ankylosis, right knee: Secondary | ICD-10-CM | POA: Diagnosis not present

## 2015-12-11 DIAGNOSIS — R531 Weakness: Secondary | ICD-10-CM

## 2015-12-11 DIAGNOSIS — R2681 Unsteadiness on feet: Secondary | ICD-10-CM | POA: Diagnosis not present

## 2015-12-11 NOTE — Therapy (Signed)
[facility] [place], [place], [address] Phone: [phone]   Fax:  [phone]  Physical Therapy Treatment  Patient Details  Name: Colleen Duran MRN: [id number] Date of Birth: 1946/04/23 Referring Provider: Mickie Hillier   Encounter Date: 12/11/2015      PT End of Session - 12/11/15 1421    Number of Visits 10   Authorization Type Medicare   Authorization - Visit Number 10   Authorization - Number of Visits 10   PT Start Time 5701   PT Stop Time 1425   PT Time Calculation (min) 40 min   Activity Tolerance Patient tolerated treatment well   Behavior During Therapy [facility] for tasks assessed/performed      Past Medical History  Diagnosis Date  . Hypertension   . High cholesterol   . Diabetes mellitus without complication ([place])     TYPE 2  . Arthritis     RA  . Wears glasses     Past Surgical History  Procedure Laterality Date  . Abdominal hysterectomy    . Bunionectomy      bilateral feet  . Cesarean section    . 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 Total knee arthroplasty Left 07/24/2014    Procedure: LEFT TOTAL KNEE ARTHROPLASTY;  Surgeon: Vickey Huger, MD;  Location: [facility];  Service: Orthopedics;  Laterality: Left;  . Colonoscopy N/A 05/11/2015    Procedure: COLONOSCOPY;  Surgeon: Danie Binder, MD;  [facility];  Service: Endoscopy;  Laterality: N/A;  2:15 PM  . Total knee arthroplasty Right 10/08/2015    Procedure: RIGHT TOTAL KNEE ARTHROPLASTY;  Surgeon: Vickey Huger, MD;  Location: [facility];  Service: Orthopedics;  Laterality: Right;    There were no vitals filed for this visit.  Visit Diagnosis:  Status post total right knee replacement  Decreased range of knee movement, right  Decreased strength  Gait instability  Localized edema      Subjective Assessment - 12/11/15 1348    Subjective Pt was in a scooter cart at [facility] and the basket came down on her leg when her  daughter placed two bags of salt into it.    Limitations Standing;Walking;House hold activities   How long can you sit comfortably? unlimited   How long can you stand comfortably? able to stand longer able to stand for 20 minutes now    How long can you walk comfortably? walking with a quad cane now and is able to walk for 30 minutes was 15.    Patient Stated Goals Be more active and more independent   Currently in Pain? No/denies            [facility] PT Assessment - 12/11/15 0001    Assessment   Medical Diagnosis Rt TKA   Referring Provider Mickie Hillier    Onset Date/Surgical Date 10/08/15   Next MD Visit March   Prior [place] residence   Prior Function   Level of Independence Independent with basic ADLs   Cognition   Overall Cognitive Status Within Functional Limits for tasks assessed   Observation/Other Assessments   Observations Rt LE edema, well healing incision   Focus on Therapeutic Outcomes (FOTO)  28% limited    Sensation   Light Touch Appears Intact   ROM / Strength   AROM / PROM / Strength Strength   AROM   Right  Knee Extension 3  was 5    Right Knee Flexion 110  was 91    Strength   Strength Assessment Site Hip   Right/Left Hip Right   Right Hip Flexion 5/5   Right Hip ABduction 4/5   Right Knee Flexion 5/5  was 3+/5    Right Knee Extension 3+/5   Palpation   Patella mobility restricted in all planes   Ambulation/Gait   Ambulation/Gait Yes   Ambulation Distance (Feet) 60 Feet   Assistive device Straight cane   Gait Pattern Decreased weight shift to right   Gait velocity decreased   Gait Comments decreased weight shift to Rt with decreased stride lenght with bilateral LEs.                      [facility] Adult PT Treatment/Exercise - 12/11/15 0001    Knee/Hip Exercises: Aerobic   Stationary Bike x 8 minutes    Knee/Hip Exercises: Standing   Heel Raises Both;15 reps   Knee/Hip Exercises:  Seated   Long Arc Quad Strengthening;Right;10 reps   Long Arc Quad Weight 3 lbs.   Heel Slides Strengthening;10 reps   Heel Slides Limitations 110     quad set x 10; bridges x 10; functional squat x 10 SLS x 3 Both LE   Side lying abduction x 10           PT Education - 12/11/15 6378    Education provided Yes   Education Details Updated HEP   Person(s) Educated Patient   Methods Explanation;Verbal cues;Handout   Comprehension Returned demonstration;Verbalized understanding          PT Short Term Goals - 12/11/15 1409    PT SHORT TERM GOAL #1   Title Patient to be independent with HEP for ROM and strengthening.   Time 3   Period Weeks   Status Achieved   PT SHORT TERM GOAL #2   Title Patient to have 105 degrees of Rt knee flexion for sitting on low surfaces.    Time 3   Period Weeks   Status Achieved   PT SHORT TERM GOAL #3   Title Patient to demo 4-/5 strength with Rt knee extension for gait stability.   Time 3   Period Weeks   Status Achieved           PT Long Term Goals - 12/11/15 1410    PT LONG TERM GOAL #1   Title Patient to be independent with advanced HEP for continuation of PT gains upon D/C.    Time 6   Period Weeks   Status Achieved   PT LONG TERM GOAL #2   Title Patient to have 115 degrees of Rt knee flexion for lower surface sitting tolerance.    Baseline 12/11/2015 at 110    Time 6   Period Weeks   Status On-going   PT LONG TERM GOAL #3   Title Patient to demo 4/5 strength with rt knee extension for gait stability.    Time 6   Period Weeks   Status Achieved   PT LONG TERM GOAL #4   Title Patient to have 0 degrees of Rt knee extension for gait stability.   Baseline 3 but most likely due to swelling    Time 6   Period Weeks   Status On-going   PT LONG TERM GOAL #5   Title Patient to demo an imporvement in her FOTO score for improved overall function.  Time 6   Period Weeks   Status Achieved               Plan -  2015/12/31 1422    Clinical Impression Statement Pt feels she is ready for discharge.  States that she feels that she can progress the rest of the way on her own.  Pt has improved in all aspects.  Therapist agrees with discharge with pt continuing HEP    PT Next Visit Plan Discharge to home exercise program           G-Codes - 12/31/15 1423    Functional Assessment Tool Used FOTO, clinical judgment   Functional Limitation Mobility: Walking and moving around   Mobility: Walking and Moving Around Goal Status [phone]) At least 20 percent but less than 40 percent impaired, limited or restricted   Mobility: Walking and Moving Around Discharge Status [phone]) At least 20 percent but less than 40 percent impaired, limited or restricted      Problem List Patient Active Problem List   Diagnosis Date Noted  . S/P total knee arthroplasty 10/08/2015  . Special screening for malignant neoplasms, colon   . S/P total knee replacement using cement 07/25/2014    Rayetta Humphrey, PT CLT [phone] 2015-12-31, 2:24 PM  [facility] [address] [place], [place], [address] Phone: [phone]   Fax:  [phone]  Name: Colleen Duran MRN: [id number] Date of Birth: [birth year]    PHYSICAL THERAPY DISCHARGE SUMMARY  Visits from Start of Care: 10  Current functional level related to goals / functional outcomes: As above  Remaining deficits: As above   Education / Equipment: HEP  Plan: Patient agrees to discharge.  Patient goals were partially met. Patient is being discharged due to being pleased with the current functional level.  ?????       Rayetta Humphrey, [place] CLT [phone]

## 2015-12-11 NOTE — Patient Instructions (Signed)
Strengthening: Hip Abduction (Side-Lying)    Tighten muscles on front of right thigh, then lift leg __15__ inches from surface, keeping knee locked.  Repeat ___10_ times per set. Do _1___ sets per session. Do ___2_ sessions per day.  http://orth.exer.us/622   Copyright  VHI. All rights reserved.  Bridging    Slowly raise buttocks from floor, keeping stomach tight. Repeat _10___ times per set. Do _1___ sets per session. Do 2____ sessions per day.  http://orth.exer.us/1096   Copyright  VHI. All rights reserved.  Heel Raise: Bilateral (Standing)    Rise on balls of feet. Repeat __10_ times per set. Do __1__ sets per session. Do __2__ sessions per day.  http://orth.exer.us/38   Copyright  VHI. All rights reserved.  Functional Quadriceps: Chair Squat    Keeping feet flat on floor, shoulder width apart, squat as low as is comfortable. Use support as necessary. Repeat __10__ times per set. Do __1__ sets per session. Do __2__ sessions per day.  http://orth.exer.us/736   Copyright  VHI. All rights reserved.

## 2015-12-12 ENCOUNTER — Ambulatory Visit (HOSPITAL_COMMUNITY): Payer: Medicare HMO

## 2015-12-13 ENCOUNTER — Ambulatory Visit (HOSPITAL_COMMUNITY): Payer: Medicare HMO

## 2015-12-17 ENCOUNTER — Encounter (HOSPITAL_COMMUNITY): Payer: Medicare HMO

## 2015-12-19 ENCOUNTER — Encounter (HOSPITAL_COMMUNITY): Payer: Medicare HMO

## 2016-01-08 DIAGNOSIS — E119 Type 2 diabetes mellitus without complications: Secondary | ICD-10-CM | POA: Diagnosis not present

## 2016-01-08 DIAGNOSIS — Z6831 Body mass index (BMI) 31.0-31.9, adult: Secondary | ICD-10-CM | POA: Diagnosis not present

## 2016-01-08 DIAGNOSIS — I10 Essential (primary) hypertension: Secondary | ICD-10-CM | POA: Diagnosis not present

## 2016-01-08 DIAGNOSIS — M170 Bilateral primary osteoarthritis of knee: Secondary | ICD-10-CM | POA: Diagnosis not present

## 2016-01-16 DIAGNOSIS — Z96651 Presence of right artificial knee joint: Secondary | ICD-10-CM | POA: Diagnosis not present

## 2016-01-16 DIAGNOSIS — Z471 Aftercare following joint replacement surgery: Secondary | ICD-10-CM | POA: Diagnosis not present

## 2016-04-21 DIAGNOSIS — E1165 Type 2 diabetes mellitus with hyperglycemia: Secondary | ICD-10-CM | POA: Diagnosis not present

## 2016-04-21 DIAGNOSIS — E784 Other hyperlipidemia: Secondary | ICD-10-CM | POA: Diagnosis not present

## 2016-04-21 DIAGNOSIS — I10 Essential (primary) hypertension: Secondary | ICD-10-CM | POA: Diagnosis not present

## 2016-04-21 DIAGNOSIS — Z6831 Body mass index (BMI) 31.0-31.9, adult: Secondary | ICD-10-CM | POA: Diagnosis not present

## 2016-04-22 DIAGNOSIS — Z96651 Presence of right artificial knee joint: Secondary | ICD-10-CM | POA: Diagnosis not present

## 2016-04-22 DIAGNOSIS — Z471 Aftercare following joint replacement surgery: Secondary | ICD-10-CM | POA: Diagnosis not present

## 2016-06-20 ENCOUNTER — Other Ambulatory Visit (HOSPITAL_COMMUNITY): Payer: Self-pay | Admitting: Internal Medicine

## 2016-06-20 DIAGNOSIS — Z1231 Encounter for screening mammogram for malignant neoplasm of breast: Secondary | ICD-10-CM

## 2016-07-14 ENCOUNTER — Ambulatory Visit (HOSPITAL_COMMUNITY)
Admission: RE | Admit: 2016-07-14 | Discharge: 2016-07-14 | Disposition: A | Discharge status: Home / Self Care | Payer: Commercial Managed Care - HMO | Source: Ambulatory Visit | Attending: Internal Medicine | Admitting: Internal Medicine

## 2016-07-14 DIAGNOSIS — Z1231 Encounter for screening mammogram for malignant neoplasm of breast: Secondary | ICD-10-CM | POA: Diagnosis not present

## 2016-09-05 IMAGING — MG MM DIGITAL SCREENING
8 series · 8 of 24 positions shown · non-contrast
Comparison: Previous exam(s).

CLINICAL DATA: Screening.

EXAM:
DIGITAL SCREENING BILATERAL MAMMOGRAM WITH 3D TOMO WITH CAD

[R MLO]
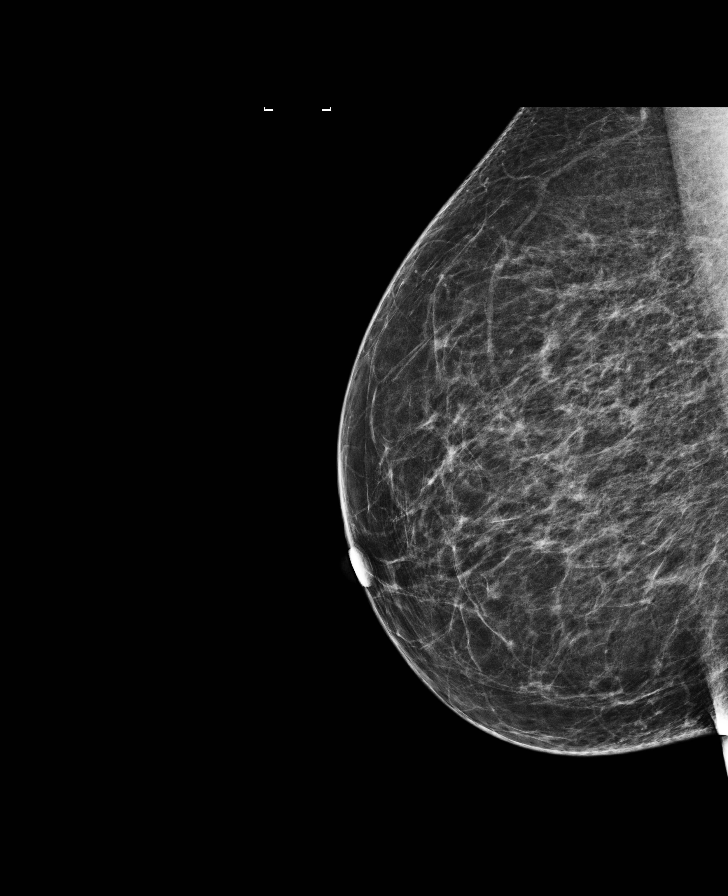

[L MLO]
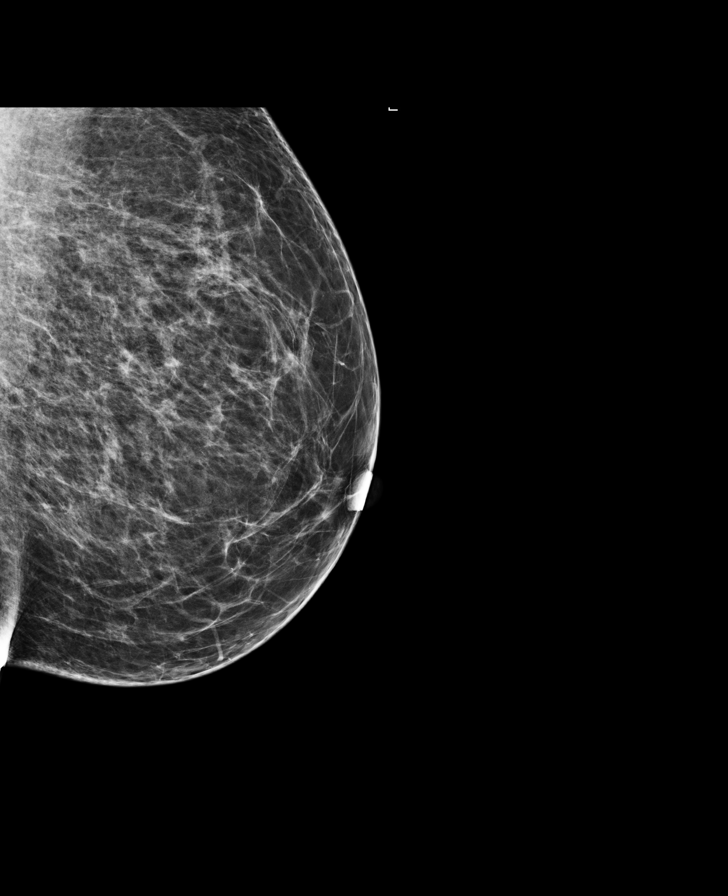

[R CC]
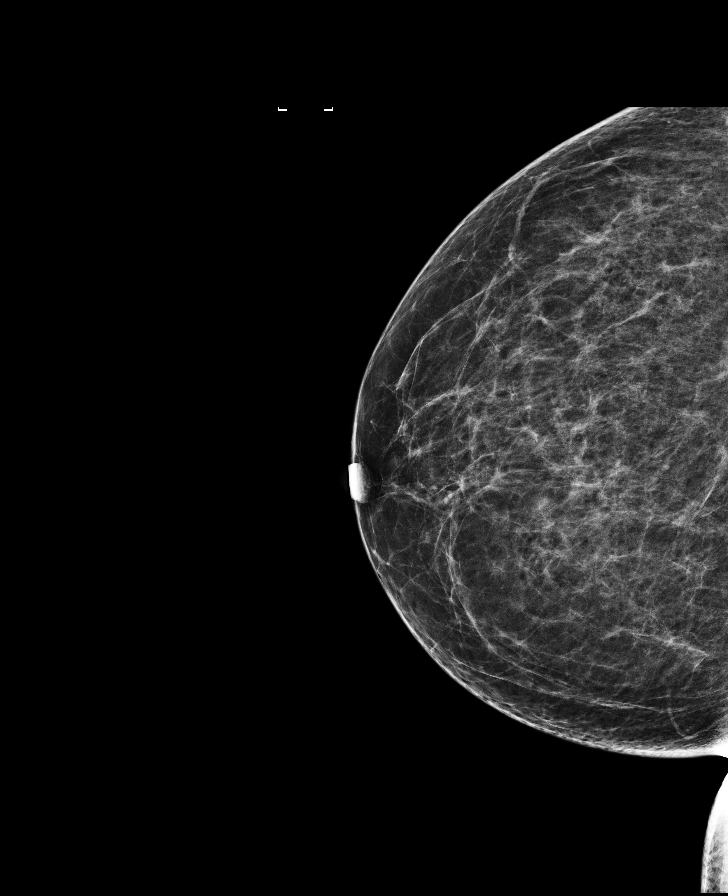

[L CC]
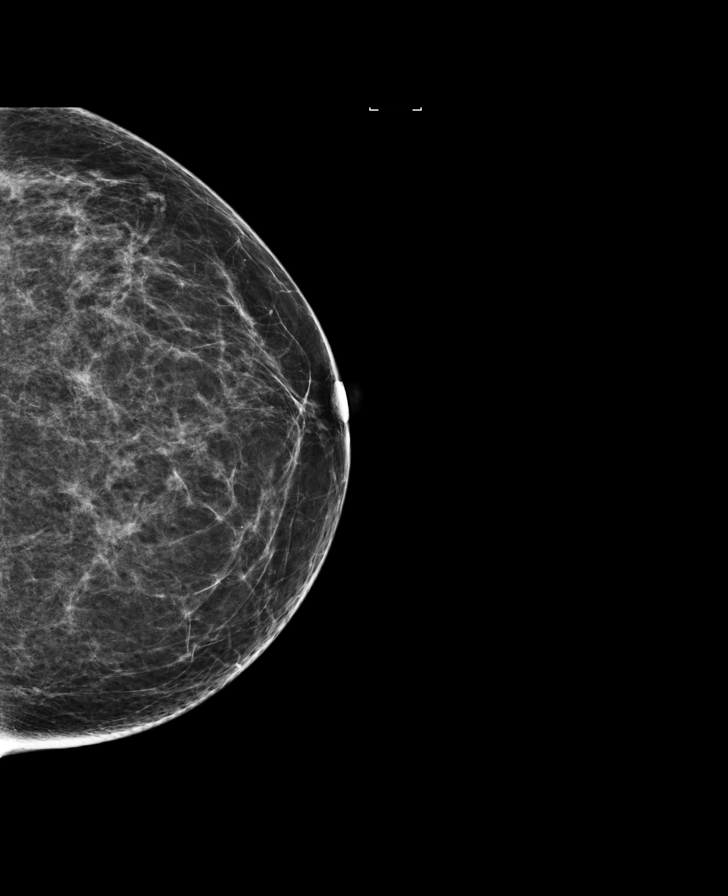

[L MLO tomo · tomo slice 35/68.0]
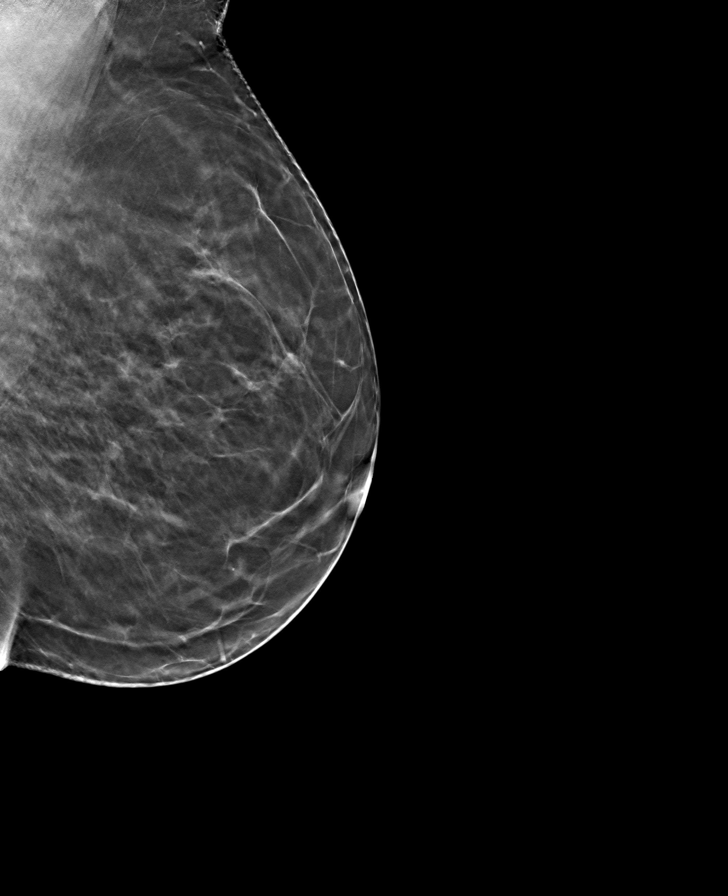

[R MLO tomo · tomo slice 33/66.0]
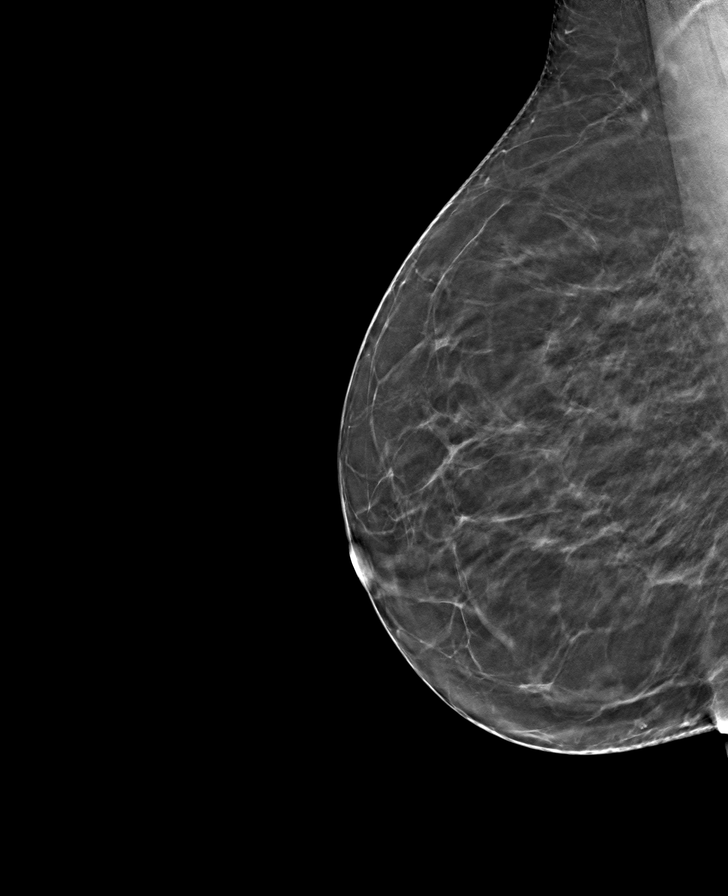

[R CC tomo · tomo slice 33/65.0]
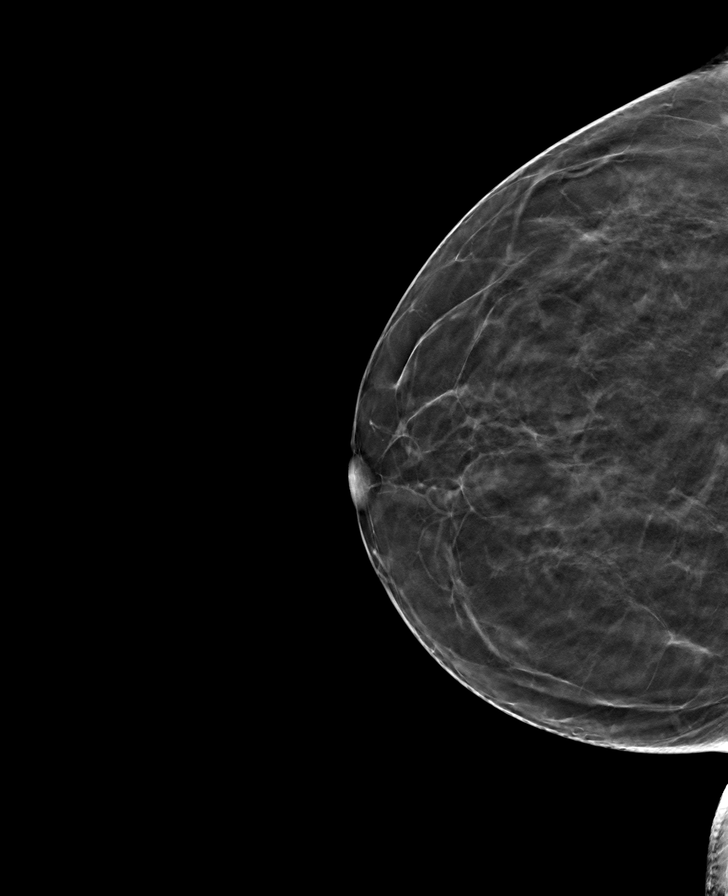

[L CC tomo · tomo slice 35/68.0]
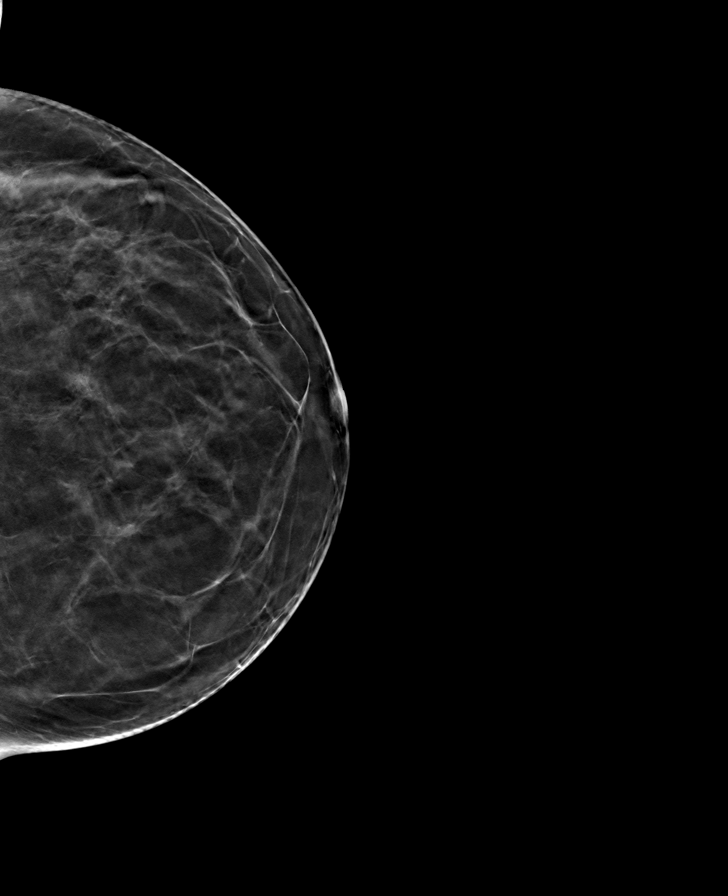

[8 of 24 positions shown; findings below may reference images not displayed]

ACR Breast Density Category b: There are scattered areas of
fibroglandular density.
FINDINGS: There are no findings suspicious for malignancy. Images were
processed with CAD.
IMPRESSION: No mammographic evidence of malignancy. A result letter of this
screening mammogram will be mailed directly to the patient.

RECOMMENDATION:
Screening mammogram in one year. (Code:55-L-23V)

BI-RADS CATEGORY  1: Negative.

## 2016-10-10 DIAGNOSIS — I10 Essential (primary) hypertension: Secondary | ICD-10-CM | POA: Diagnosis not present

## 2016-10-10 DIAGNOSIS — Z6832 Body mass index (BMI) 32.0-32.9, adult: Secondary | ICD-10-CM | POA: Diagnosis not present

## 2016-10-10 DIAGNOSIS — E784 Other hyperlipidemia: Secondary | ICD-10-CM | POA: Diagnosis not present

## 2016-10-10 DIAGNOSIS — E1165 Type 2 diabetes mellitus with hyperglycemia: Secondary | ICD-10-CM | POA: Diagnosis not present

## 2016-12-02 ENCOUNTER — Ambulatory Visit (INDEPENDENT_AMBULATORY_CARE_PROVIDER_SITE_OTHER): Payer: Medicare HMO | Admitting: Family

## 2016-12-02 ENCOUNTER — Ambulatory Visit (INDEPENDENT_AMBULATORY_CARE_PROVIDER_SITE_OTHER): Payer: Medicare HMO

## 2016-12-02 ENCOUNTER — Encounter: Payer: Self-pay | Admitting: Family

## 2016-12-02 VITALS — BP 162/96 | HR 72 | Temp 98.4°F | Ht 66.5 in | Wt 215.0 lb

## 2016-12-02 DIAGNOSIS — I10 Essential (primary) hypertension: Secondary | ICD-10-CM

## 2016-12-02 DIAGNOSIS — E1159 Type 2 diabetes mellitus with other circulatory complications: Secondary | ICD-10-CM | POA: Insufficient documentation

## 2016-12-02 DIAGNOSIS — Z1159 Encounter for screening for other viral diseases: Secondary | ICD-10-CM

## 2016-12-02 DIAGNOSIS — E785 Hyperlipidemia, unspecified: Secondary | ICD-10-CM

## 2016-12-02 DIAGNOSIS — Z780 Asymptomatic menopausal state: Secondary | ICD-10-CM | POA: Diagnosis not present

## 2016-12-02 DIAGNOSIS — Z23 Encounter for immunization: Secondary | ICD-10-CM

## 2016-12-02 DIAGNOSIS — E669 Obesity, unspecified: Secondary | ICD-10-CM | POA: Insufficient documentation

## 2016-12-02 DIAGNOSIS — R5383 Other fatigue: Secondary | ICD-10-CM | POA: Diagnosis not present

## 2016-12-02 DIAGNOSIS — E119 Type 2 diabetes mellitus without complications: Secondary | ICD-10-CM | POA: Diagnosis not present

## 2016-12-02 DIAGNOSIS — E1169 Type 2 diabetes mellitus with other specified complication: Secondary | ICD-10-CM | POA: Insufficient documentation

## 2016-12-02 LAB — BAYER DCA HB A1C WAIVED: HB A1C: 6.5 % (ref <7.0)

## 2016-12-02 NOTE — Addendum Note (Signed)
Addended by: Shelbie Ammons on: 12/02/2016 11:11 AM   Modules accepted: Orders

## 2016-12-02 NOTE — Patient Instructions (Signed)
Health Maintenance, Female Adopting a healthy lifestyle and getting preventive care can go a long way to promote health and wellness. Talk with your health care provider about what schedule of regular examinations is right for you. This is a good chance for you to check in with your provider about disease prevention and staying healthy. In between checkups, there are plenty of things you can do on your own. Experts have done a lot of research about which lifestyle changes and preventive measures are most likely to keep you healthy. Ask your health care provider for more information. Weight and diet Eat a healthy diet  Be sure to include plenty of vegetables, fruits, low-fat dairy products, and lean protein.  Do not eat a lot of foods high in solid fats, added sugars, or salt.  Get regular exercise. This is one of the most important things you can do for your health.  Most adults should exercise for at least 150 minutes each week. The exercise should increase your heart rate and make you sweat (moderate-intensity exercise).  Most adults should also do strengthening exercises at least twice a week. This is in addition to the moderate-intensity exercise. Maintain a healthy weight  Body mass index (BMI) is a measurement that can be used to identify possible weight problems. It estimates body fat based on height and weight. Your health care provider can help determine your BMI and help you achieve or maintain a healthy weight.  For females 76 years of age and older:  A BMI below 18.5 is considered underweight.  A BMI of 18.5 to 24.9 is normal.  A BMI of 25 to 29.9 is considered overweight.  A BMI of 30 and above is considered obese. Watch levels of cholesterol and blood lipids  You should start having your blood tested for lipids and cholesterol at 73 years of age, then have this test every 5 years.  You may need to have your cholesterol levels checked more often if:  Your lipid or  cholesterol levels are high.  You are older than 73 years of age.  You are at high risk for heart disease. Cancer screening Lung Cancer  Lung cancer screening is recommended for adults 64-42 years old who are at high risk for lung cancer because of a history of smoking.  A yearly low-dose CT scan of the lungs is recommended for people who:  Currently smoke.  Have quit within the past 15 years.  Have at least a 30-pack-year history of smoking. A pack year is smoking an average of one pack of cigarettes a day for 1 year.  Yearly screening should continue until it has been 15 years since you quit.  Yearly screening should stop if you develop a health problem that would prevent you from having lung cancer treatment. Breast Cancer  Practice breast self-awareness. This means understanding how your breasts normally appear and feel.  It also means doing regular breast self-exams. Let your health care provider know about any changes, no matter how small.  If you are in your 20s or 30s, you should have a clinical breast exam (CBE) by a health care provider every 1-3 years as part of a regular health exam.  If you are 34 or older, have a CBE every year. Also consider having a breast X-ray (mammogram) every year.  If you have a family history of breast cancer, talk to your health care provider about genetic screening.  If you are at high risk for breast cancer, talk  to your health care provider about having an MRI and a mammogram every year.  Breast cancer gene (BRCA) assessment is recommended for women who have family members with BRCA-related cancers. BRCA-related cancers include:  Breast.  Ovarian.  Tubal.  Peritoneal cancers.  Results of the assessment will determine the need for genetic counseling and BRCA1 and BRCA2 testing. Cervical Cancer  Your health care provider may recommend that you be screened regularly for cancer of the pelvic organs (ovaries, uterus, and vagina).  This screening involves a pelvic examination, including checking for microscopic changes to the surface of your cervix (Pap test). You may be encouraged to have this screening done every 3 years, beginning at age 24.  For women ages 66-65, health care providers may recommend pelvic exams and Pap testing every 3 years, or they may recommend the Pap and pelvic exam, combined with testing for human papilloma virus (HPV), every 5 years. Some types of HPV increase your risk of cervical cancer. Testing for HPV may also be done on women of any age with unclear Pap test results.  Other health care providers may not recommend any screening for nonpregnant women who are considered low risk for pelvic cancer and who do not have symptoms. Ask your health care provider if a screening pelvic exam is right for you.  If you have had past treatment for cervical cancer or a condition that could lead to cancer, you need Pap tests and screening for cancer for at least 20 years after your treatment. If Pap tests have been discontinued, your risk factors (such as having a new sexual partner) need to be reassessed to determine if screening should resume. Some women have medical problems that increase the chance of getting cervical cancer. In these cases, your health care provider may recommend more frequent screening and Pap tests. Colorectal Cancer  This type of cancer can be detected and often prevented.  Routine colorectal cancer screening usually begins at 73 years of age and continues through 73 years of age.  Your health care provider may recommend screening at an earlier age if you have risk factors for colon cancer.  Your health care provider may also recommend using home test kits to check for hidden blood in the stool.  A small camera at the end of a tube can be used to examine your colon directly (sigmoidoscopy or colonoscopy). This is done to check for the earliest forms of colorectal cancer.  Routine  screening usually begins at age 41.  Direct examination of the colon should be repeated every 5-10 years through 73 years of age. However, you may need to be screened more often if early forms of precancerous polyps or small growths are found. Skin Cancer  Check your skin from head to toe regularly.  Tell your health care provider about any new moles or changes in moles, especially if there is a change in a mole's shape or color.  Also tell your health care provider if you have a mole that is larger than the size of a pencil eraser.  Always use sunscreen. Apply sunscreen liberally and repeatedly throughout the day.  Protect yourself by wearing long sleeves, pants, a wide-brimmed hat, and sunglasses whenever you are outside. Heart disease, diabetes, and high blood pressure  High blood pressure causes heart disease and increases the risk of stroke. High blood pressure is more likely to develop in:  People who have blood pressure in the high end of the normal range (130-139/85-89 mm Hg).  People who are overweight or obese.  People who are African American.  If you are 59-24 years of age, have your blood pressure checked every 3-5 years. If you are 34 years of age or older, have your blood pressure checked every year. You should have your blood pressure measured twice-once when you are at a hospital or clinic, and once when you are not at a hospital or clinic. Record the average of the two measurements. To check your blood pressure when you are not at a hospital or clinic, you can use:  An automated blood pressure machine at a pharmacy.  A home blood pressure monitor.  If you are between 29 years and 60 years old, ask your health care provider if you should take aspirin to prevent strokes.  Have regular diabetes screenings. This involves taking a blood sample to check your fasting blood sugar level.  If you are at a normal weight and have a low risk for diabetes, have this test once  every three years after 73 years of age.  If you are overweight and have a high risk for diabetes, consider being tested at a younger age or more often. Preventing infection Hepatitis B  If you have a higher risk for hepatitis B, you should be screened for this virus. You are considered at high risk for hepatitis B if:  You were born in a country where hepatitis B is common. Ask your health care provider which countries are considered high risk.  Your parents were born in a high-risk country, and you have not been immunized against hepatitis B (hepatitis B vaccine).  You have HIV or AIDS.  You use needles to inject street drugs.  You live with someone who has hepatitis B.  You have had sex with someone who has hepatitis B.  You get hemodialysis treatment.  You take certain medicines for conditions, including cancer, organ transplantation, and autoimmune conditions. Hepatitis C  Blood testing is recommended for:  Everyone born from [birth year] through 1967.  Anyone with known risk factors for hepatitis C. Sexually transmitted infections (STIs)  You should be screened for sexually transmitted infections (STIs) including gonorrhea and chlamydia if:  You are sexually active and are younger than 73 years of age.  You are older than 73 years of age and your health care provider tells you that you are at risk for this type of infection.  Your sexual activity has changed since you were last screened and you are at an increased risk for chlamydia or gonorrhea. Ask your health care provider if you are at risk.  If you do not have HIV, but are at risk, it may be recommended that you take a prescription medicine daily to prevent HIV infection. This is called pre-exposure prophylaxis (PrEP). You are considered at risk if:  You are sexually active and do not regularly use condoms or know the HIV status of your partner(s).  You take drugs by injection.  You are sexually active with a partner  who has HIV. Talk with your health care provider about whether you are at high risk of being infected with HIV. If you choose to begin PrEP, you should first be tested for HIV. You should then be tested every 3 months for as long as you are taking PrEP. Pregnancy  If you are premenopausal and you may become pregnant, ask your health care provider about preconception counseling.  If you may become pregnant, take 400 to 800 micrograms (mcg) of folic acid  every day.  If you want to prevent pregnancy, talk to your health care provider about birth control (contraception). Osteoporosis and menopause  Osteoporosis is a disease in which the bones lose minerals and strength with aging. This can result in serious bone fractures. Your risk for osteoporosis can be identified using a bone density scan.  If you are 4 years of age or older, or if you are at risk for osteoporosis and fractures, ask your health care provider if you should be screened.  Ask your health care provider whether you should take a calcium or vitamin D supplement to lower your risk for osteoporosis.  Menopause may have certain physical symptoms and risks.  Hormone replacement therapy may reduce some of these symptoms and risks. Talk to your health care provider about whether hormone replacement therapy is right for you. Follow these instructions at home:  Schedule regular health, dental, and eye exams.  Stay current with your immunizations.  Do not use any tobacco products including cigarettes, chewing tobacco, or electronic cigarettes.  If you are pregnant, do not drink alcohol.  If you are breastfeeding, limit how much and how often you drink alcohol.  Limit alcohol intake to no more than 1 drink per day for nonpregnant women. One drink equals 12 ounces of beer, 5 ounces of wine, or 1 ounces of hard liquor.  Do not use street drugs.  Do not share needles.  Ask your health care provider for help if you need support  or information about quitting drugs.  Tell your health care provider if you often feel depressed.  Tell your health care provider if you have ever been abused or do not feel safe at home. This information is not intended to replace advice given to you by your health care provider. Make sure you discuss any questions you have with your health care provider. Document Released: 03/10/2011 Document Revised: 01/31/2016 Document Reviewed: 05/29/2015 Elsevier Interactive Patient Education  2017 [employer].

## 2016-12-02 NOTE — Progress Notes (Signed)
Subjective:    Patient ID: Colleen Duran, female    DOB: 11-Nov-1945, 71 y.o.   MRN: [id number]  Pt presents to the office today to establish care. Pt reports feeling fatigue.  Diabetes  She presents for her follow-up diabetic visit. She has type 2 diabetes mellitus. Her disease course has been stable. There are no hypoglycemic associated symptoms. Pertinent negatives for diabetes include no blurred vision, no foot paresthesias, no foot ulcerations and no visual change. Symptoms are stable. Pertinent negatives for diabetic complications include no CVA. Risk factors for coronary artery disease include diabetes mellitus, dyslipidemia, obesity, post-menopausal and sedentary lifestyle. She is following a generally healthy diet. Her breakfast blood glucose range is generally 130-140 mg/dl. An ACE inhibitor/angiotensin II receptor blocker is being taken.  Hypertension  This is a chronic problem. The current episode started more than 1 year ago. The problem has been waxing and waning since onset. The problem is uncontrolled. Associated symptoms include malaise/fatigue. Pertinent negatives include no blurred vision, palpitations, peripheral edema or shortness of breath. Risk factors for coronary artery disease include diabetes mellitus, dyslipidemia, obesity and sedentary lifestyle. Past treatments include calcium channel blockers, beta blockers and angiotensin blockers. The current treatment provides moderate improvement. There is no history of kidney disease, CAD/MI, CVA or heart failure.  Hyperlipidemia  This is a chronic problem. The current episode started more than 1 year ago. The problem is controlled. Recent lipid tests were reviewed and are normal. Exacerbating diseases include obesity. Pertinent negatives include no shortness of breath. Current antihyperlipidemic treatment includes statins. The current treatment provides moderate improvement of lipids. Risk factors for coronary artery disease include  diabetes mellitus, dyslipidemia, hypertension, a sedentary lifestyle and post-menopausal.      Review of Systems  Constitutional: Positive for malaise/fatigue.  Eyes: Negative for blurred vision.  Respiratory: Negative for shortness of breath.   Cardiovascular: Negative for palpitations.  All other systems reviewed and are negative.  Family History  Problem Relation Age of Onset  . Hypertension Mother   . Stroke Mother     Social History   Social History  . Marital status: Widowed    Spouse name: N/A  . Number of children: N/A  . Years of education: N/A   Social History Main Topics  . Smoking status: Never Smoker  . Smokeless tobacco: Never Used  . Alcohol use No  . Drug use: No  . Sexual activity: No   Other Topics Concern  . None   Social History Narrative  . None       Objective:   Physical Exam  Constitutional: She is oriented to person, place, and time. She appears well-developed and well-nourished. No distress.  Obese   HENT:  Head: Normocephalic and atraumatic.  Right Ear: External ear normal.  Left Ear: External ear normal.  Nose: Nose normal.  Mouth/Throat: Oropharynx is clear and moist.  Eyes: Pupils are equal, round, and reactive to light.  Neck: Normal range of motion. Neck supple. No thyromegaly present.  Cardiovascular: Normal rate, regular rhythm, normal heart sounds and intact distal pulses.   No murmur heard. Pulmonary/Chest: Effort normal and breath sounds normal. No respiratory distress. She has no wheezes.  Abdominal: Soft. Bowel sounds are normal. She exhibits no distension. There is no tenderness.  Musculoskeletal: Normal range of motion. She exhibits no edema or tenderness.  Neurological: She is alert and oriented to person, place, and time.  Skin: Skin is warm and dry.  Psychiatric: She has a normal mood  and affect. Her behavior is normal. Judgment and thought content normal.  Vitals reviewed.  Diabetic Foot Exam - Simple     Simple Foot Form Diabetic Foot exam was performed with the following findings:  Yes 12/02/2016  9:58 AM  Visual Inspection No deformities, no ulcerations, no other skin breakdown bilaterally:  Yes Sensation Testing Intact to touch and monofilament testing bilaterally:  Yes Pulse Check Posterior Tibialis and Dorsalis pulse intact bilaterally:  Yes Comments       BP (!) 153/97   Pulse 71   Temp 98.4 F (36.9 C) (Oral)   Ht 5' 6.5" (1.689 m)   Wt 215 lb (97.5 kg)   BMI 34.18 kg/m      Assessment & Plan:  1. Type 2 diabetes mellitus without complication, without long-term current use of insulin (HCC) - CMP14+EGFR - Microalbumin / creatinine urine ratio - Bayer DCA Hb A1c Waived  2. Essential hypertension - CMP14+EGFR  3. Hyperlipidemia, unspecified hyperlipidemia type - CMP14+EGFR - Lipid panel  4. Obesity (BMI 30-39.9) - CMP14+EGFR  5. Encounter for hepatitis C screening test for low risk patient - CMP14+EGFR - Hepatitis C antibody  6. Other fatigue - CMP14+EGFR - Thyroid Panel With TSH - VITAMIN D 25 Hydroxy (Vit-D Deficiency, Fractures)  7. Post-menopausal - DG [facility] DEXA   Continue all meds Labs pending Health Maintenance reviewed Diet and exercise encouraged RTO 4 months   Evelina Dun, FNP

## 2016-12-03 LAB — CMP14+EGFR
A/G RATIO: 1.3 (ref 1.2–2.2)
ALK PHOS: 79 IU/L (ref 39–117)
ALT: 12 IU/L (ref 0–32)
AST: 17 IU/L (ref 0–40)
Albumin: 4.3 g/dL (ref 3.5–4.8)
BUN/Creatinine Ratio: 15 (ref 12–28)
BUN: 15 mg/dL (ref 8–27)
Bilirubin Total: 0.5 mg/dL (ref 0.0–1.2)
CHLORIDE: 101 mmol/L (ref 96–106)
CO2: 21 mmol/L (ref 18–29)
Calcium: 9.6 mg/dL (ref 8.7–10.3)
Creatinine, Ser: 1.01 mg/dL — ABNORMAL HIGH (ref 0.57–1.00)
GFR calc Af Amer: 64 mL/min/{1.73_m2} (ref >59)
GFR calc non Af Amer: 56 mL/min/{1.73_m2} — ABNORMAL LOW (ref >59)
GLUCOSE: 117 mg/dL — AB (ref 65–99)
Globulin, Total: 3.2 g/dL (ref 1.5–4.5)
POTASSIUM: 4.5 mmol/L (ref 3.5–5.2)
Sodium: 142 mmol/L (ref 134–144)
Total Protein: 7.5 g/dL (ref 6.0–8.5)

## 2016-12-03 LAB — HEPATITIS C ANTIBODY: Hep C Virus Ab: <0.1 s/co ratio (ref 0.0–0.9)

## 2016-12-03 LAB — THYROID PANEL WITH TSH
FREE THYROXINE INDEX: 1.6 (ref 1.2–4.9)
T3 UPTAKE RATIO: 25 % (ref 24–39)
T4, Total: 6.5 ug/dL (ref 4.5–12.0)
TSH: 3.15 u[IU]/mL (ref 0.450–4.500)

## 2016-12-03 LAB — LIPID PANEL
CHOLESTEROL TOTAL: 149 mg/dL (ref 100–199)
Chol/HDL Ratio: 2.3 ratio units (ref 0.0–4.4)
HDL: 65 mg/dL (ref >39)
LDL Calculated: 64 mg/dL (ref 0–99)
Triglycerides: 98 mg/dL (ref 0–149)
VLDL CHOLESTEROL CAL: 20 mg/dL (ref 5–40)

## 2016-12-03 LAB — MICROALBUMIN / CREATININE URINE RATIO
Creatinine, Urine: 57.8 mg/dL
MICROALB/CREAT RATIO: 17.1 mg/g{creat} (ref 0.0–30.0)
Microalbumin, Urine: 9.9 ug/mL

## 2016-12-03 LAB — VITAMIN D 25 HYDROXY (VIT D DEFICIENCY, FRACTURES): VIT D 25 HYDROXY: 25.7 ng/mL — AB (ref 30.0–100.0)

## 2016-12-04 ENCOUNTER — Other Ambulatory Visit: Payer: Self-pay | Admitting: Family

## 2016-12-04 ENCOUNTER — Encounter: Payer: Self-pay | Admitting: Family

## 2016-12-04 ENCOUNTER — Ambulatory Visit (INDEPENDENT_AMBULATORY_CARE_PROVIDER_SITE_OTHER): Payer: Medicare HMO | Admitting: Family

## 2016-12-04 VITALS — BP 151/84 | HR 76 | Temp 98.5°F | Ht 66.5 in | Wt 218.0 lb

## 2016-12-04 DIAGNOSIS — R5383 Other fatigue: Secondary | ICD-10-CM | POA: Diagnosis not present

## 2016-12-04 DIAGNOSIS — E669 Obesity, unspecified: Secondary | ICD-10-CM

## 2016-12-04 MED ORDER — VITAMIN D (ERGOCALCIFEROL) 1.25 MG (50000 UNIT) PO CAPS: 50000.0000 [IU] | ORAL_CAPSULE | ORAL | 3 refills | Status: DC

## 2016-12-04 NOTE — Patient Instructions (Signed)

## 2016-12-04 NOTE — Progress Notes (Signed)
   Subjective:    Patient ID: Colleen Duran, female    DOB: 05-28-1946, 71 y.o.   MRN: [id number]  HPI PT presents to the office today with complaints of fatigue. Pt was seen in the office and had lab work drawn on 12/02/16. CMP, TSH, and HgbA1C that was at goal. PT's Vit D levels were low and a prescription was sent to pharmacy. Pt has not picked this prescription up.   She states she has felt fatigued over the last few months and it has been unchanged. PT states it is hard to "get my housework done". PT denies any chest pain, but does have SOB when she exerts herself.   Pt states she works out 3 times a week at [employer]  For about 20 mins.    Review of Systems  Constitutional: Positive for fatigue.  All other systems reviewed and are negative.      Objective:   Physical Exam  Constitutional: She is oriented to person, place, and time. She appears well-developed and well-nourished. No distress.  HENT:  Head: Normocephalic and atraumatic.  Right Ear: External ear normal.  Mouth/Throat: Oropharynx is clear and moist.  Eyes: Pupils are equal, round, and reactive to light.  Neck: Normal range of motion. Neck supple. No thyromegaly present.  Cardiovascular: Normal rate, regular rhythm, normal heart sounds and intact distal pulses.   No murmur heard. Pulmonary/Chest: Effort normal and breath sounds normal. No respiratory distress. She has no wheezes.  Abdominal: Soft. Bowel sounds are normal. She exhibits no distension. There is no tenderness.  Musculoskeletal: Normal range of motion. She exhibits no edema or tenderness.  Neurological: She is alert and oriented to person, place, and time.  Skin: Skin is warm and dry.  Psychiatric: She has a normal mood and affect. Her behavior is normal. Judgment and thought content normal.  Vitals reviewed.  EKG- Sinus Rhythm  BP (!) 151/84   Pulse 76   Temp 98.5 F (36.9 C) (Oral)   Ht 5' 6.5" (1.689 m)   Wt 218 lb (98.9 kg)   BMI 34.66 kg/m        Assessment & Plan:  1. Fatigue, unspecified type - EKG 12-Lead - Anemia Profile B  2. Obesity (BMI 30-39.9)  Will do anemia profile, other lab work was stable other than Vit D. PT to start Vit D rx.  Rest Healthy diet and exercise Reduction in weight could help Force fluids RTO prn and keep chronic follow up  Evelina Dun, FNP

## 2016-12-05 LAB — ANEMIA PROFILE B
Basophils Absolute: 0 10*3/uL (ref 0.0–0.2)
Basos: 1 %
EOS (ABSOLUTE): 0.3 10*3/uL (ref 0.0–0.4)
Eos: 6 %
FERRITIN: 37 ng/mL (ref 15–150)
FOLATE: 13.6 ng/mL (ref >3.0)
HEMATOCRIT: 33.7 % — AB (ref 34.0–46.6)
Hemoglobin: 11.8 g/dL (ref 11.1–15.9)
IMMATURE GRANS (ABS): 0 10*3/uL (ref 0.0–0.1)
Immature Granulocytes: 0 %
Iron Saturation: 19 % (ref 15–55)
Iron: 57 ug/dL (ref 27–139)
LYMPHS: 42 %
Lymphocytes Absolute: 2.3 10*3/uL (ref 0.7–3.1)
MCH: 30.5 pg (ref 26.6–33.0)
MCHC: 35 g/dL (ref 31.5–35.7)
MCV: 87 fL (ref 79–97)
MONOS ABS: 0.5 10*3/uL (ref 0.1–0.9)
Monocytes: 9 %
Neutrophils Absolute: 2.3 10*3/uL (ref 1.4–7.0)
Neutrophils: 42 %
Platelets: 231 10*3/uL (ref 150–379)
RBC: 3.87 x10E6/uL (ref 3.77–5.28)
RDW: 13.6 % (ref 12.3–15.4)
Retic Ct Pct: 1 % (ref 0.6–2.6)
Total Iron Binding Capacity: 306 ug/dL (ref 250–450)
UIBC: 249 ug/dL (ref 118–369)
VITAMIN B 12: 659 pg/mL (ref 232–1245)
WBC: 5.4 10*3/uL (ref 3.4–10.8)

## 2016-12-10 ENCOUNTER — Telehealth: Payer: Self-pay | Admitting: Family

## 2016-12-10 NOTE — Telephone Encounter (Signed)
Aware of lab results  

## 2016-12-19 ENCOUNTER — Encounter: Payer: Self-pay | Admitting: Family

## 2016-12-22 ENCOUNTER — Other Ambulatory Visit: Payer: Self-pay | Admitting: Family

## 2017-01-08 ENCOUNTER — Encounter: Payer: Self-pay | Admitting: Family

## 2017-01-08 ENCOUNTER — Ambulatory Visit (INDEPENDENT_AMBULATORY_CARE_PROVIDER_SITE_OTHER): Payer: Medicare HMO | Admitting: Family

## 2017-01-08 VITALS — BP 162/90 | HR 75 | Temp 97.2°F | Ht 66.5 in | Wt 217.4 lb

## 2017-01-08 DIAGNOSIS — I10 Essential (primary) hypertension: Secondary | ICD-10-CM

## 2017-01-08 DIAGNOSIS — R5383 Other fatigue: Secondary | ICD-10-CM | POA: Diagnosis not present

## 2017-01-08 MED ORDER — LOSARTAN POTASSIUM-HCTZ 100-12.5 MG PO TABS: 1.0000 | ORAL_TABLET | Freq: Every day | ORAL | 3 refills | Status: DC

## 2017-01-08 NOTE — Patient Instructions (Signed)
Will decrease Labetalol to 200 mg twice a day for one week and then you can decrease to 100 mg twice a day. Losartan increased to 100 mg from 50 mg, Prescription sent to pharmacy

## 2017-01-08 NOTE — Progress Notes (Signed)
Subjective:    Patient ID: Colleen Duran, female    DOB: 02/02/1946, 71 y.o.   MRN: [id number]  Pt presents to the office today to rechecked  Fatigue and recheck BP.  Pt brings a log from home of her BP that average 130's/80's.    Pt requesting her labetalol be stopped. She reports after taking it she feels fatigue, weak, and nauseous. Pt states she was taking Labetalol  400mg BID, but has decreased to 300mg BID.  Hypertension  This is a chronic problem. The current episode started more than 1 year ago. The problem has been waxing and waning since onset. The problem is uncontrolled. Associated symptoms include malaise/fatigue. Pertinent negatives include no headaches, palpitations, peripheral edema or shortness of breath.      Review of Systems  Constitutional: Positive for malaise/fatigue.  Respiratory: Negative for shortness of breath.   Cardiovascular: Negative for palpitations.  Neurological: Negative for headaches.  All other systems reviewed and are negative.      Objective:   Physical Exam  Constitutional: She is oriented to person, place, and time. She appears well-developed and well-nourished. No distress.  HENT:  Head: Normocephalic.  Eyes: Pupils are equal, round, and reactive to light.  Neck: Normal range of motion. Neck supple. No thyromegaly present.  Cardiovascular: Normal rate, regular rhythm, normal heart sounds and intact distal pulses.   No murmur heard. Pulmonary/Chest: Effort normal and breath sounds normal. No respiratory distress. She has no wheezes.  Abdominal: Soft. Bowel sounds are normal. She exhibits no distension. There is no tenderness.  Musculoskeletal: Normal range of motion. She exhibits no edema or tenderness.  Neurological: She is alert and oriented to person, place, and time.  Skin: Skin is warm and dry.  Psychiatric: She has a normal mood and affect. Her behavior is normal. Judgment and thought content normal.  Vitals reviewed.    BP (!)  157/87   Pulse 78   Temp 97.2 F (36.2 C) (Oral)   Ht 5' 6.5" (1.689 m)   Wt 217 lb 6.4 oz (98.6 kg)   BMI 34.56 kg/m      Assessment & Plan:  1. Essential hypertension Will decrease Labetalol to 200 mg BID for one week and then pt can decrease to 100 mg BID. Losartan increased to 100 mg from 50 mg -Daily blood pressure log given with instructions on how to fill out and told to bring to next visit -Dash diet information given -Exercise encouraged - Stress Management  -Continue current meds -RTO in 2 weeks to recheck - losartan-hydrochlorothiazide (HYZAAR) 100-12.5 MG tablet; Take 1 tablet by mouth daily.  Dispense: 90 tablet; Refill: 3 - BMP8+EGFR  Christy Hawks, FNP   

## 2017-01-09 LAB — BMP8+EGFR
BUN/Creatinine Ratio: 16 (ref 12–28)
BUN: 16 mg/dL (ref 8–27)
CALCIUM: 9.9 mg/dL (ref 8.7–10.3)
CHLORIDE: 102 mmol/L (ref 96–106)
CO2: 19 mmol/L (ref 18–29)
Creatinine, Ser: 1.02 mg/dL — ABNORMAL HIGH (ref 0.57–1.00)
GFR, EST AFRICAN AMERICAN: 64 mL/min/{1.73_m2} (ref >59)
GFR, EST NON AFRICAN AMERICAN: 55 mL/min/{1.73_m2} — AB (ref >59)
Glucose: 115 mg/dL — ABNORMAL HIGH (ref 65–99)
POTASSIUM: 3.9 mmol/L (ref 3.5–5.2)
SODIUM: 143 mmol/L (ref 134–144)

## 2017-01-23 ENCOUNTER — Encounter: Payer: Self-pay | Admitting: Family

## 2017-01-23 ENCOUNTER — Ambulatory Visit (INDEPENDENT_AMBULATORY_CARE_PROVIDER_SITE_OTHER): Payer: Medicare HMO | Admitting: Family

## 2017-01-23 DIAGNOSIS — I10 Essential (primary) hypertension: Secondary | ICD-10-CM | POA: Diagnosis not present

## 2017-01-23 MED ORDER — ATORVASTATIN CALCIUM 40 MG PO TABS: 40.0000 mg | ORAL_TABLET | Freq: Every day | ORAL | 2 refills | Status: DC

## 2017-01-23 MED ORDER — METFORMIN HCL 500 MG PO TABS: 500.0000 mg | ORAL_TABLET | Freq: Two times a day (BID) | ORAL | 2 refills | Status: DC

## 2017-01-23 MED ORDER — AMLODIPINE BESYLATE 10 MG PO TABS: 10.0000 mg | ORAL_TABLET | Freq: Every day | ORAL | 2 refills | Status: DC

## 2017-01-23 MED ORDER — LOSARTAN POTASSIUM-HCTZ 100-12.5 MG PO TABS
1.0000 | ORAL_TABLET | Freq: Every day | ORAL | 3 refills | Status: DC
Start: 2017-01-23 — End: 2017-07-21

## 2017-01-23 NOTE — Patient Instructions (Signed)

## 2017-01-23 NOTE — Progress Notes (Signed)
   Subjective:    Patient ID: Colleen Duran, female    DOB: December 07, 1945, 71 y.o.   MRN: [id number]  PT presents to the office today to recheck HTN. PT's BP is not at goal. PT's labetalol decreased related to fatigue and weakness and losartan increased. Hypertension  This is a chronic problem. The current episode started more than 1 year ago. The problem has been waxing and waning since onset. The problem is controlled. Pertinent negatives include no chest pain, malaise/fatigue, peripheral edema or shortness of breath.  Diabetes  Pertinent negatives for diabetes include no chest pain.      Review of Systems  Constitutional: Negative for malaise/fatigue.  Respiratory: Negative for shortness of breath.   Cardiovascular: Negative for chest pain.  All other systems reviewed and are negative.      Objective:   Physical Exam  Constitutional: She is oriented to person, place, and time. She appears well-developed and well-nourished. No distress.  HENT:  Head: Normocephalic.  Eyes: Pupils are equal, round, and reactive to light.  Neck: Normal range of motion. Neck supple. No thyromegaly present.  Cardiovascular: Normal rate, regular rhythm, normal heart sounds and intact distal pulses.   No murmur heard. Pulmonary/Chest: Effort normal and breath sounds normal. No respiratory distress. She has no wheezes.  Abdominal: Soft. Bowel sounds are normal. She exhibits no distension. There is no tenderness.  Musculoskeletal: Normal range of motion. She exhibits no edema or tenderness.  Neurological: She is alert and oriented to person, place, and time.  Skin: Skin is warm and dry.  Psychiatric: She has a normal mood and affect. Her behavior is normal. Judgment and thought content normal.  Vitals reviewed.   BP (!) 141/83   Pulse 69   Temp 98.4 F (36.9 C) (Oral)   Ht 5' 6.5" (1.689 m)   Wt 213 lb 9.6 oz (96.9 kg)   BMI 33.96 kg/m        Assessment & Plan:  1. Essential  hypertension Labetalol stopped today- Pt states she has only been taking 100 mg daily and her last dose was 24 hours ago and BP is stable.  -Daily blood pressure log given with instructions on how to fill out and told to bring to next visit -Dash diet information given -Exercise encouraged - Stress Management  -Continue current meds -RTO in 3 months  - losartan-hydrochlorothiazide (HYZAAR) 100-12.5 MG tablet; Take 1 tablet by mouth daily.  Dispense: 90 tablet; Refill: 3 - amLODipine (NORVASC) 10 MG tablet; Take 1 tablet (10 mg total) by mouth daily.  Dispense: 90 tablet; Refill: 2 - BMP8+EGFR   Evelina Dun, FNP

## 2017-01-24 LAB — BMP8+EGFR
BUN/Creatinine Ratio: 13 (ref 12–28)
BUN: 11 mg/dL (ref 8–27)
CO2: 24 mmol/L (ref 18–29)
Calcium: 9.7 mg/dL (ref 8.7–10.3)
Chloride: 100 mmol/L (ref 96–106)
Creatinine, Ser: 0.88 mg/dL (ref 0.57–1.00)
GFR calc Af Amer: 76 mL/min/1.73
GFR calc non Af Amer: 66 mL/min/1.73
Glucose: 96 mg/dL (ref 65–99)
Potassium: 4.2 mmol/L (ref 3.5–5.2)
Sodium: 142 mmol/L (ref 134–144)

## 2017-04-03 ENCOUNTER — Ambulatory Visit: Payer: Medicare HMO | Admitting: Family

## 2017-04-30 ENCOUNTER — Telehealth: Payer: Self-pay | Admitting: *Deleted

## 2017-04-30 ENCOUNTER — Ambulatory Visit (INDEPENDENT_AMBULATORY_CARE_PROVIDER_SITE_OTHER): Payer: Medicare HMO | Admitting: Family

## 2017-04-30 ENCOUNTER — Encounter: Payer: Self-pay | Admitting: Family

## 2017-04-30 VITALS — BP 138/80 | HR 74 | Temp 97.4°F | Ht 66.5 in | Wt 211.2 lb

## 2017-04-30 DIAGNOSIS — E785 Hyperlipidemia, unspecified: Secondary | ICD-10-CM

## 2017-04-30 DIAGNOSIS — E669 Obesity, unspecified: Secondary | ICD-10-CM | POA: Diagnosis not present

## 2017-04-30 DIAGNOSIS — E119 Type 2 diabetes mellitus without complications: Secondary | ICD-10-CM | POA: Diagnosis not present

## 2017-04-30 DIAGNOSIS — I10 Essential (primary) hypertension: Secondary | ICD-10-CM

## 2017-04-30 LAB — BAYER DCA HB A1C WAIVED: HB A1C (BAYER DCA - WAIVED): 6.4 % (ref <7.0)

## 2017-04-30 MED ORDER — FREESTYLE LIBRE SENSOR SYSTEM MISC: 1.0000 | Freq: Two times a day (BID) | 0 refills | Status: DC

## 2017-04-30 MED ORDER — FREESTYLE LIBRE READER DEVI: 1.0000 | Freq: Two times a day (BID) | 0 refills | Status: DC

## 2017-04-30 NOTE — Progress Notes (Signed)
Subjective:    Patient ID: Colleen Duran, female    DOB: 02/03/1946, 71 y.o.   MRN: [id number]  PT presents to the office today for chronic follow up.  Hypertension  This is a chronic problem. The current episode started more than 1 year ago. The problem has been resolved since onset. Pertinent negatives include no blurred vision, headaches or shortness of breath. Malaise/fatigue: "a little, but not like it was" Risk factors for coronary artery disease include diabetes mellitus, dyslipidemia, obesity, post-menopausal state and sedentary lifestyle. The current treatment provides moderate improvement. There is no history of kidney disease, CAD/MI, CVA or heart failure.  Hyperlipidemia  This is a chronic problem. The current episode started more than 1 year ago. The problem is controlled. Recent lipid tests were reviewed and are normal. Exacerbating diseases include obesity. Pertinent negatives include no shortness of breath. Current antihyperlipidemic treatment includes statins. The current treatment provides moderate improvement of lipids. Risk factors for coronary artery disease include diabetes mellitus, hypertension, a sedentary lifestyle, dyslipidemia and obesity.  Diabetes  She presents for her follow-up diabetic visit. She has type 2 diabetes mellitus. Her disease course has been stable. There are no hypoglycemic associated symptoms. Pertinent negatives for hypoglycemia include no headaches. There are no diabetic associated symptoms. Pertinent negatives for diabetes include no blurred vision, no foot paresthesias, no foot ulcerations and no visual change. There are no hypoglycemic complications. Symptoms are stable. Pertinent negatives for diabetic complications include no CVA, heart disease, nephropathy or peripheral neuropathy. Risk factors for coronary artery disease include diabetes mellitus, dyslipidemia, obesity, post-menopausal and sedentary lifestyle. Her breakfast blood glucose range is  generally 110-130 mg/dl. Eye exam is not current.      Review of Systems  Constitutional: Malaise/fatigue: "a little, but not like it was"  Eyes: Negative for blurred vision.  Respiratory: Negative for shortness of breath.   Neurological: Negative for headaches.  All other systems reviewed and are negative.      Objective:   Physical Exam  Constitutional: She is oriented to person, place, and time. She appears well-developed and well-nourished. No distress.  HENT:  Head: Normocephalic and atraumatic.  Right Ear: External ear normal.  Left Ear: External ear normal.  Nose: Nose normal.  Mouth/Throat: Oropharynx is clear and moist.  Eyes: Pupils are equal, round, and reactive to light.  Neck: Normal range of motion. Neck supple. No thyromegaly present.  Cardiovascular: Normal rate, regular rhythm, normal heart sounds and intact distal pulses.   No murmur heard. Pulmonary/Chest: Effort normal and breath sounds normal. No respiratory distress. She has no wheezes.  Abdominal: Soft. Bowel sounds are normal. She exhibits no distension. There is no tenderness.  Musculoskeletal: Normal range of motion. She exhibits no edema or tenderness.  Neurological: She is alert and oriented to person, place, and time.  Skin: Skin is warm and dry.  Psychiatric: She has a normal mood and affect. Her behavior is normal. Judgment and thought content normal.  Vitals reviewed.     BP 138/80   Pulse 74   Temp (!) 97.4 F (36.3 C) (Oral)   Ht 5' 6.5" (1.689 m)   Wt 211 lb 3.2 oz (95.8 kg)   BMI 33.58 kg/m      Assessment & Plan:  1. Essential hypertension - CMP14+EGFR  2. Type 2 diabetes mellitus without complication, without long-term current use of insulin (HCC) - Bayer DCA Hb A1c Waived - CMP14+EGFR - Ambulatory referral to Ophthalmology - Continuous Blood Gluc Receiver (FREESTYLE  LIBRE READER) DEVI; 1 Device by Does not apply route 2 (two) times daily.  Dispense: 1 Device; Refill: 0 -  Continuous Blood Gluc Sensor (FREESTYLE LIBRE SENSOR SYSTEM) MISC; 1 Device by Does not apply route 2 (two) times daily.  Dispense: 1 each; Refill: 0  3. Hyperlipidemia, unspecified hyperlipidemia type - CMP14+EGFR - Lipid panel  4. Obesity (BMI 30-39.9) - CMP14+EGFR   Continue all meds Labs pending Health Maintenance reviewed Diet and exercise encouraged RTO 6 months   Evelina Dun, FNP

## 2017-04-30 NOTE — Patient Instructions (Signed)

## 2017-05-01 LAB — CMP14+EGFR
A/G RATIO: 1.5 (ref 1.2–2.2)
ALK PHOS: 82 IU/L (ref 39–117)
ALT: 12 IU/L (ref 0–32)
AST: 19 IU/L (ref 0–40)
Albumin: 4.6 g/dL (ref 3.5–4.8)
BUN / CREAT RATIO: 13 (ref 12–28)
BUN: 11 mg/dL (ref 8–27)
Bilirubin Total: 0.3 mg/dL (ref 0.0–1.2)
CO2: 23 mmol/L (ref 20–29)
Calcium: 10 mg/dL (ref 8.7–10.3)
Chloride: 101 mmol/L (ref 96–106)
Creatinine, Ser: 0.86 mg/dL (ref 0.57–1.00)
GFR calc Af Amer: 78 mL/min/{1.73_m2} (ref >59)
GFR calc non Af Amer: 68 mL/min/{1.73_m2} (ref >59)
GLOBULIN, TOTAL: 3.1 g/dL (ref 1.5–4.5)
Glucose: 128 mg/dL — ABNORMAL HIGH (ref 65–99)
POTASSIUM: 4.1 mmol/L (ref 3.5–5.2)
SODIUM: 142 mmol/L (ref 134–144)
Total Protein: 7.7 g/dL (ref 6.0–8.5)

## 2017-05-01 LAB — LIPID PANEL
CHOL/HDL RATIO: 2.4 ratio (ref 0.0–4.4)
CHOLESTEROL TOTAL: 143 mg/dL (ref 100–199)
HDL: 60 mg/dL (ref >39)
LDL Calculated: 59 mg/dL (ref 0–99)
TRIGLYCERIDES: 121 mg/dL (ref 0–149)
VLDL Cholesterol Cal: 24 mg/dL (ref 5–40)

## 2017-05-19 DIAGNOSIS — Z7984 Long term (current) use of oral hypoglycemic drugs: Secondary | ICD-10-CM | POA: Diagnosis not present

## 2017-05-19 DIAGNOSIS — H2513 Age-related nuclear cataract, bilateral: Secondary | ICD-10-CM | POA: Diagnosis not present

## 2017-05-19 DIAGNOSIS — E119 Type 2 diabetes mellitus without complications: Secondary | ICD-10-CM | POA: Diagnosis not present

## 2017-05-19 LAB — HM DIABETES EYE EXAM

## 2017-06-04 NOTE — Telephone Encounter (Signed)
Note closed - pt never called back

## 2017-07-21 ENCOUNTER — Ambulatory Visit: Payer: Medicare HMO | Admitting: Family

## 2017-07-21 ENCOUNTER — Encounter: Payer: Self-pay | Admitting: Family

## 2017-07-21 VITALS — BP 130/83 | HR 88 | Temp 98.2°F | Ht 66.5 in | Wt 206.6 lb

## 2017-07-21 DIAGNOSIS — Z23 Encounter for immunization: Secondary | ICD-10-CM

## 2017-07-21 DIAGNOSIS — E1169 Type 2 diabetes mellitus with other specified complication: Secondary | ICD-10-CM | POA: Diagnosis not present

## 2017-07-21 DIAGNOSIS — E669 Obesity, unspecified: Secondary | ICD-10-CM

## 2017-07-21 DIAGNOSIS — E119 Type 2 diabetes mellitus without complications: Secondary | ICD-10-CM

## 2017-07-21 DIAGNOSIS — E785 Hyperlipidemia, unspecified: Secondary | ICD-10-CM | POA: Diagnosis not present

## 2017-07-21 DIAGNOSIS — E1159 Type 2 diabetes mellitus with other circulatory complications: Secondary | ICD-10-CM | POA: Diagnosis not present

## 2017-07-21 DIAGNOSIS — M150 Primary generalized (osteo)arthritis: Secondary | ICD-10-CM | POA: Diagnosis not present

## 2017-07-21 DIAGNOSIS — M159 Polyosteoarthritis, unspecified: Secondary | ICD-10-CM

## 2017-07-21 DIAGNOSIS — I10 Essential (primary) hypertension: Secondary | ICD-10-CM | POA: Diagnosis not present

## 2017-07-21 DIAGNOSIS — M1990 Unspecified osteoarthritis, unspecified site: Secondary | ICD-10-CM | POA: Insufficient documentation

## 2017-07-21 MED ORDER — LOSARTAN POTASSIUM-HCTZ 100-12.5 MG PO TABS: 1.0000 | ORAL_TABLET | Freq: Every day | ORAL | 3 refills | Status: DC

## 2017-07-21 MED ORDER — ATORVASTATIN CALCIUM 40 MG PO TABS: 40.0000 mg | ORAL_TABLET | Freq: Every day | ORAL | 2 refills | Status: DC

## 2017-07-21 MED ORDER — AMLODIPINE BESYLATE 10 MG PO TABS: 10.0000 mg | ORAL_TABLET | Freq: Every day | ORAL | 2 refills | Status: DC

## 2017-07-21 MED ORDER — SPIRONOLACTONE 25 MG PO TABS: 25.0000 mg | ORAL_TABLET | Freq: Every day | ORAL | 1 refills | Status: DC

## 2017-07-21 MED ORDER — METFORMIN HCL 500 MG PO TABS: 500.0000 mg | ORAL_TABLET | Freq: Two times a day (BID) | ORAL | 2 refills | Status: DC

## 2017-07-21 NOTE — Progress Notes (Signed)
Subjective:    Patient ID: Colleen Duran, female    DOB: [birth year], 71 y.o.   MRN: [id number]  PT presents to the office today for chronic follow up.  Hyperlipidemia  This is a chronic problem. The current episode started more than 1 year ago. The problem is controlled. Recent lipid tests were reviewed and are normal. Exacerbating diseases include obesity. Pertinent negatives include no shortness of breath. Current antihyperlipidemic treatment includes statins. The current treatment provides moderate improvement of lipids. Risk factors for coronary artery disease include diabetes mellitus, dyslipidemia, obesity and post-menopausal.  Hypertension  This is a chronic problem. The current episode started more than 1 year ago. The problem has been resolved since onset. The problem is controlled. Associated symptoms include malaise/fatigue. Pertinent negatives include no blurred vision, headaches, peripheral edema or shortness of breath. Risk factors for coronary artery disease include diabetes mellitus, dyslipidemia, obesity and sedentary lifestyle. The current treatment provides moderate improvement. There is no history of kidney disease, CAD/MI or heart failure.  Diabetes  She presents for her follow-up diabetic visit. She has type 2 diabetes mellitus. There are no hypoglycemic associated symptoms. Pertinent negatives for hypoglycemia include no headaches. Pertinent negatives for diabetes include no blurred vision and no foot paresthesias. Symptoms are stable. She is following a generally healthy diet. Her breakfast blood glucose range is generally 130-140 mg/dl. Eye exam is current.  Arthritis  Presents for follow-up visit. She complains of pain and stiffness. The symptoms have been stable. Affected locations include the right shoulder (low back). Her pain is at a severity of 6/10.      Review of Systems  Constitutional: Positive for malaise/fatigue.  Eyes: Negative for blurred vision.    Respiratory: Negative for shortness of breath.   Musculoskeletal: Positive for arthritis and stiffness.  Neurological: Negative for headaches.  All other systems reviewed and are negative.      Objective:   Physical Exam  Constitutional: She is oriented to person, place, and time. She appears well-developed and well-nourished. No distress.  HENT:  Head: Normocephalic and atraumatic.  Right Ear: External ear normal.  Left Ear: External ear normal.  Nose: Nose normal.  Mouth/Throat: Oropharynx is clear and moist.  Eyes: Pupils are equal, round, and reactive to light.  Neck: Normal range of motion. Neck supple. No thyromegaly present.  Cardiovascular: Normal rate, regular rhythm, normal heart sounds and intact distal pulses.  No murmur heard. Pulmonary/Chest: Effort normal and breath sounds normal. No respiratory distress. She has no wheezes.  Abdominal: Soft. Bowel sounds are normal. She exhibits no distension. There is no tenderness.  Musculoskeletal: Normal range of motion. She exhibits no edema or tenderness.  Neurological: She is alert and oriented to person, place, and time.  Skin: Skin is warm and dry.  Psychiatric: She has a normal mood and affect. Her behavior is normal. Judgment and thought content normal.  Vitals reviewed.       BP 130/83   Pulse 88   Temp 98.2 F (36.8 C) (Oral)   Ht 5' 6.5" (1.689 m)   Wt 206 lb 9.6 oz (93.7 kg)   BMI 32.85 kg/m    Assessment & Plan:  1. Type 2 diabetes mellitus without complication, without long-term current use of insulin (HCC) - metFORMIN (GLUCOPHAGE) 500 MG tablet; Take 1 tablet (500 mg total) 2 (two) times daily with a meal by mouth.  Dispense: 180 tablet; Refill: 2  2. Hypertension associated with diabetes ([place]) - losartan-hydrochlorothiazide (HYZAAR) 100-12.5 MG tablet;  Take 1 tablet daily by mouth.  Dispense: 90 tablet; Refill: 3 - amLODipine (NORVASC) 10 MG tablet; Take 1 tablet (10 mg total) daily by mouth.   Dispense: 90 tablet; Refill: 2 - spironolactone (ALDACTONE) 25 MG tablet; Take 1 tablet (25 mg total) daily by mouth.  Dispense: 90 tablet; Refill: 1  3. Hyperlipidemia associated with type 2 diabetes mellitus (HCC) - atorvastatin (LIPITOR) 40 MG tablet; Take 1 tablet (40 mg total) daily at 6 PM by mouth.  Dispense: 90 tablet; Refill: 2  4. Obesity (BMI 30-39.9)  5. Primary osteoarthritis involving multiple joints    Continue all meds Labs pending Health Maintenance reviewed Diet and exercise encouraged RTO 4 months   Evelina Dun, FNP

## 2017-07-21 NOTE — Patient Instructions (Signed)

## 2017-08-05 DIAGNOSIS — M26609 Unspecified temporomandibular joint disorder, unspecified side: Secondary | ICD-10-CM | POA: Diagnosis not present

## 2017-08-05 DIAGNOSIS — Z87891 Personal history of nicotine dependence: Secondary | ICD-10-CM | POA: Diagnosis not present

## 2017-08-05 DIAGNOSIS — Z8249 Family history of ischemic heart disease and other diseases of the circulatory system: Secondary | ICD-10-CM | POA: Diagnosis not present

## 2017-08-05 DIAGNOSIS — I10 Essential (primary) hypertension: Secondary | ICD-10-CM | POA: Diagnosis not present

## 2017-08-05 DIAGNOSIS — Z7984 Long term (current) use of oral hypoglycemic drugs: Secondary | ICD-10-CM | POA: Diagnosis not present

## 2017-08-05 DIAGNOSIS — M2689 Other dentofacial anomalies: Secondary | ICD-10-CM | POA: Diagnosis not present

## 2017-08-05 DIAGNOSIS — F419 Anxiety disorder, unspecified: Secondary | ICD-10-CM | POA: Diagnosis not present

## 2017-08-05 DIAGNOSIS — Z79899 Other long term (current) drug therapy: Secondary | ICD-10-CM | POA: Diagnosis not present

## 2017-08-05 DIAGNOSIS — E7800 Pure hypercholesterolemia, unspecified: Secondary | ICD-10-CM | POA: Diagnosis not present

## 2017-08-05 DIAGNOSIS — E119 Type 2 diabetes mellitus without complications: Secondary | ICD-10-CM | POA: Diagnosis not present

## 2017-10-06 DIAGNOSIS — Z1231 Encounter for screening mammogram for malignant neoplasm of breast: Secondary | ICD-10-CM | POA: Diagnosis not present

## 2017-10-06 LAB — HM MAMMOGRAPHY

## 2017-10-19 ENCOUNTER — Telehealth: Payer: Self-pay | Admitting: Family

## 2017-10-20 DIAGNOSIS — N6489 Other specified disorders of breast: Secondary | ICD-10-CM | POA: Diagnosis not present

## 2017-10-20 DIAGNOSIS — R928 Other abnormal and inconclusive findings on diagnostic imaging of breast: Secondary | ICD-10-CM | POA: Diagnosis not present

## 2017-10-30 ENCOUNTER — Encounter: Payer: Self-pay | Admitting: Family

## 2017-10-30 ENCOUNTER — Ambulatory Visit (INDEPENDENT_AMBULATORY_CARE_PROVIDER_SITE_OTHER): Payer: Medicare HMO | Admitting: Family

## 2017-10-30 VITALS — BP 132/76 | HR 93 | Temp 98.3°F | Ht 66.5 in | Wt 208.0 lb

## 2017-10-30 DIAGNOSIS — E1169 Type 2 diabetes mellitus with other specified complication: Secondary | ICD-10-CM

## 2017-10-30 DIAGNOSIS — M150 Primary generalized (osteo)arthritis: Secondary | ICD-10-CM | POA: Diagnosis not present

## 2017-10-30 DIAGNOSIS — E785 Hyperlipidemia, unspecified: Secondary | ICD-10-CM | POA: Diagnosis not present

## 2017-10-30 DIAGNOSIS — E119 Type 2 diabetes mellitus without complications: Secondary | ICD-10-CM

## 2017-10-30 DIAGNOSIS — Z23 Encounter for immunization: Secondary | ICD-10-CM

## 2017-10-30 DIAGNOSIS — E1159 Type 2 diabetes mellitus with other circulatory complications: Secondary | ICD-10-CM | POA: Diagnosis not present

## 2017-10-30 DIAGNOSIS — E669 Obesity, unspecified: Secondary | ICD-10-CM | POA: Diagnosis not present

## 2017-10-30 DIAGNOSIS — M159 Polyosteoarthritis, unspecified: Secondary | ICD-10-CM

## 2017-10-30 DIAGNOSIS — I10 Essential (primary) hypertension: Secondary | ICD-10-CM

## 2017-10-30 LAB — BAYER DCA HB A1C WAIVED: HB A1C (BAYER DCA - WAIVED): 6 % (ref <7.0)

## 2017-10-30 MED ORDER — ASPIRIN EC 81 MG PO TBEC: 81.0000 mg | DELAYED_RELEASE_TABLET | Freq: Every day | ORAL | 1 refills | Status: AC

## 2017-10-30 NOTE — Addendum Note (Signed)
Addended by: Shelbie Ammons on: 10/30/2017 12:40 PM   Modules accepted: Orders

## 2017-10-30 NOTE — Progress Notes (Signed)
Subjective:    Patient ID: Colleen Duran, female    DOB: [birth year], 72 y.o.   MRN: [id number]  PT presents to the office today for chronic follow up.  Diabetes  She presents for her follow-up diabetic visit. She has type 2 diabetes mellitus. Her disease course has been stable. There are no hypoglycemic associated symptoms. There are no diabetic associated symptoms. Pertinent negatives for diabetes include no blurred vision, no foot paresthesias and no visual change. There are no hypoglycemic complications. Symptoms are stable. Pertinent negatives for diabetic complications include no CVA, heart disease, nephropathy or peripheral neuropathy. Risk factors for coronary artery disease include diabetes mellitus, dyslipidemia, obesity, post-menopausal and sedentary lifestyle. Her weight is stable. Her overall blood glucose range is 110-130 mg/dl. Eye exam is current.  Hyperlipidemia  This is a chronic problem. The current episode started more than 1 year ago. The problem is controlled. Recent lipid tests were reviewed and are normal. Exacerbating diseases include obesity. Pertinent negatives include no shortness of breath. Current antihyperlipidemic treatment includes statins. The current treatment provides moderate improvement of lipids. Risk factors for coronary artery disease include diabetes mellitus, dyslipidemia, hypertension and a sedentary lifestyle.  Hypertension  This is a chronic problem. The current episode started more than 1 year ago. The problem has been waxing and waning since onset. Pertinent negatives include no blurred vision, peripheral edema or shortness of breath. Risk factors for coronary artery disease include dyslipidemia and sedentary lifestyle. The current treatment provides moderate improvement. There is no history of kidney disease, CAD/MI or CVA.  Arthritis  Presents for follow-up visit. She complains of pain and stiffness. The symptoms have been stable. Affected location: lower  back. Her pain is at a severity of 1/10.      Review of Systems  Eyes: Negative for blurred vision.  Respiratory: Negative for shortness of breath.   Musculoskeletal: Positive for arthritis and stiffness.  All other systems reviewed and are negative.      Objective:   Physical Exam  Constitutional: She is oriented to person, place, and time. She appears well-developed and well-nourished. No distress.  HENT:  Head: Normocephalic and atraumatic.  Right Ear: External ear normal.  Left Ear: External ear normal.  Nose: Nose normal.  Mouth/Throat: Oropharynx is clear and moist.  Eyes: Pupils are equal, round, and reactive to light.  Neck: Normal range of motion. Neck supple. No thyromegaly present.  Cardiovascular: Normal rate, regular rhythm, normal heart sounds and intact distal pulses.  No murmur heard. Pulmonary/Chest: Effort normal and breath sounds normal. No respiratory distress. She has no wheezes.  Abdominal: Soft. Bowel sounds are normal. She exhibits no distension. There is no tenderness.  Musculoskeletal: Normal range of motion. She exhibits edema (trace bilateral ankles). She exhibits no tenderness.  Neurological: She is alert and oriented to person, place, and time.  Skin: Skin is warm and dry.  Psychiatric: She has a normal mood and affect. Her behavior is normal. Judgment and thought content normal.  Vitals reviewed.     BP 132/76   Pulse 93   Temp 98.3 F (36.8 C) (Oral)   Ht 5' 6.5" (1.689 m)   Wt 208 lb (94.3 kg)   BMI 33.07 kg/m      Assessment & Plan:  1. Hypertension associated with diabetes ([place]) - CMP14+EGFR  2. Type 2 diabetes mellitus without complication, without long-term current use of insulin (HCC) - CMP14+EGFR - Bayer DCA Hb A1c Waived  3. Hyperlipidemia associated with type 2  diabetes mellitus ([place]) - CMP14+EGFR  4. Primary osteoarthritis involving multiple joints - CMP14+EGFR  5. Obesity (BMI 30-39.9) - CMP14+EGFR   Continue  all meds Labs pending Health Maintenance reviewed Diet and exercise encouraged RTO 6 months   Evelina Dun, FNP

## 2017-10-30 NOTE — Patient Instructions (Signed)

## 2017-10-31 LAB — CMP14+EGFR
ALT: 13 IU/L (ref 0–32)
AST: 21 IU/L (ref 0–40)
Albumin/Globulin Ratio: 1.3 (ref 1.2–2.2)
Albumin: 4.5 g/dL (ref 3.5–4.8)
Alkaline Phosphatase: 70 IU/L (ref 39–117)
BUN/Creatinine Ratio: 18 (ref 12–28)
BUN: 15 mg/dL (ref 8–27)
Bilirubin Total: 0.3 mg/dL (ref 0.0–1.2)
CALCIUM: 10.2 mg/dL (ref 8.7–10.3)
CO2: 15 mmol/L — ABNORMAL LOW (ref 20–29)
Chloride: 110 mmol/L — ABNORMAL HIGH (ref 96–106)
Creatinine, Ser: 0.82 mg/dL (ref 0.57–1.00)
GFR calc Af Amer: 82 mL/min/{1.73_m2} (ref >59)
GFR, EST NON AFRICAN AMERICAN: 71 mL/min/{1.73_m2} (ref >59)
Globulin, Total: 3.6 g/dL (ref 1.5–4.5)
Glucose: 132 mg/dL — ABNORMAL HIGH (ref 65–99)
Potassium: 4.5 mmol/L (ref 3.5–5.2)
Sodium: 148 mmol/L — ABNORMAL HIGH (ref 134–144)
TOTAL PROTEIN: 8.1 g/dL (ref 6.0–8.5)

## 2017-11-30 ENCOUNTER — Ambulatory Visit: Payer: Medicare HMO | Admitting: Family

## 2017-12-01 ENCOUNTER — Ambulatory Visit (INDEPENDENT_AMBULATORY_CARE_PROVIDER_SITE_OTHER): Payer: Medicare HMO | Admitting: Family

## 2017-12-01 ENCOUNTER — Encounter: Payer: Self-pay | Admitting: Family

## 2017-12-01 VITALS — BP 151/81 | HR 82 | Temp 97.3°F | Ht 66.5 in | Wt 207.8 lb

## 2017-12-01 DIAGNOSIS — E870 Hyperosmolality and hypernatremia: Secondary | ICD-10-CM

## 2017-12-01 DIAGNOSIS — I10 Essential (primary) hypertension: Secondary | ICD-10-CM | POA: Diagnosis not present

## 2017-12-01 DIAGNOSIS — E669 Obesity, unspecified: Secondary | ICD-10-CM

## 2017-12-01 DIAGNOSIS — E1159 Type 2 diabetes mellitus with other circulatory complications: Secondary | ICD-10-CM

## 2017-12-01 NOTE — Progress Notes (Signed)
   Subjective:    Patient ID: Colleen Duran, female    DOB: 05-26-1946, 72 y.o.   MRN: [id number]  PT presents to the office today to recheck sodium and B/P. Hypertension  This is a chronic problem. The current episode started more than 1 year ago. The problem has been waxing and waning since onset. The problem is controlled. Pertinent negatives include no headaches, malaise/fatigue, peripheral edema or shortness of breath. There is no history of kidney disease, CAD/MI or heart failure.       Review of Systems  Constitutional: Negative for malaise/fatigue.  Respiratory: Negative for shortness of breath.   Neurological: Negative for headaches.  All other systems reviewed and are negative.      Objective:   Physical Exam  Constitutional: She is oriented to person, place, and time. She appears well-developed and well-nourished. No distress.  HENT:  Head: Normocephalic and atraumatic.  Right Ear: External ear normal.  Mouth/Throat: Oropharynx is clear and moist.  Eyes: Pupils are equal, round, and reactive to light.  Neck: Normal range of motion. Neck supple. No thyromegaly present.  Cardiovascular: Normal rate, regular rhythm, normal heart sounds and intact distal pulses.  No murmur heard. Pulmonary/Chest: Effort normal and breath sounds normal. No respiratory distress. She has no wheezes.  Abdominal: Soft. Bowel sounds are normal. She exhibits no distension. There is no tenderness.  Musculoskeletal: Normal range of motion. She exhibits no edema or tenderness.  Neurological: She is alert and oriented to person, place, and time.  Skin: Skin is warm and dry.  Psychiatric: She has a normal mood and affect. Her behavior is normal. Judgment and thought content normal.  Vitals reviewed.     BP (!) 150/86   Pulse 82   Temp (!) 97.3 F (36.3 C) (Oral)   Ht 5' 6.5" (1.689 m)   Wt 207 lb 12.8 oz (94.3 kg)   BMI 33.04 kg/m      Assessment & Plan:  1. Hypernatremia Force  fluids Low salt diet - BMP8+EGFR  2. Hypertension associated with diabetes ([place]) -Daily blood pressure log given with instructions on how to fill out and told to bring to next visit -Dash diet information given -Exercise encouraged - Stress Management  -Continue current meds -RTO in 4 monts - BMP8+EGFR  3. Obesity (BMI 30-39.9) - BMP8+EGFR   Evelina Dun, FNP

## 2017-12-01 NOTE — Patient Instructions (Signed)
Hypernatremia Hypernatremia is when the blood has too much salt (sodium) in it and not enough water. Water and salt must be balanced in the blood. This condition is serious and often an emergency. Follow these instructions at home:  Drink enough fluid to keep your pee (urine) clear or pale yellow.  Take over-the-counter and prescription medicines only as told by your doctor.  Avoid salty processed foods, including canned, jarred, frozen, or boxed foods. Some examples include pickles, frozen dinners, canned soups, potato and corn chips, and olives.  Always drink fluids after exercise.  Always drink fluids after throwing up (vomiting) or having watery poop (diarrhea).  Keep all follow-up visits as told by your doctor. This is important. Contact a doctor if:  You have watery poop.  You throw up.  You have a fever or chills.  You cannot follow advice from your doctor about drinking fluids. Get help right away if:  You feel light-headed or weak.  You have a fast heartbeat.  You get confused.  You get restless.  You get short-tempered.  You have a fit of movements that you cannot control (seizure).  You faint.  You have signs of losing too much body fluid (dehydration), such as: ? Dark pee, or very little or no pee. ? Cracked lips. ? No tears. ? Dry mouth. ? Sunken eyes. ? Sleepiness. This information is not intended to replace advice given to you by your health care provider. Make sure you discuss any questions you have with your health care provider. Document Released: 08/14/2011 Document Revised: 01/31/2016 Document Reviewed: 01/10/2015 Elsevier Interactive Patient Education  [employer].

## 2017-12-02 LAB — BMP8+EGFR
BUN / CREAT RATIO: 15 (ref 12–28)
BUN: 13 mg/dL (ref 8–27)
CO2: 22 mmol/L (ref 20–29)
CREATININE: 0.86 mg/dL (ref 0.57–1.00)
Calcium: 9.3 mg/dL (ref 8.7–10.3)
Chloride: 101 mmol/L (ref 96–106)
GFR calc non Af Amer: 67 mL/min/{1.73_m2} (ref >59)
GFR, EST AFRICAN AMERICAN: 78 mL/min/{1.73_m2} (ref >59)
Glucose: 101 mg/dL — ABNORMAL HIGH (ref 65–99)
Potassium: 4.1 mmol/L (ref 3.5–5.2)
SODIUM: 139 mmol/L (ref 134–144)

## 2017-12-30 ENCOUNTER — Ambulatory Visit (INDEPENDENT_AMBULATORY_CARE_PROVIDER_SITE_OTHER): Payer: Medicare HMO | Admitting: *Deleted

## 2017-12-30 ENCOUNTER — Encounter: Payer: Self-pay | Admitting: *Deleted

## 2017-12-30 VITALS — BP 130/75 | HR 80 | Ht 66.0 in | Wt 207.0 lb

## 2017-12-30 DIAGNOSIS — Z0000 Encounter for general adult medical examination without abnormal findings: Secondary | ICD-10-CM | POA: Diagnosis not present

## 2017-12-30 DIAGNOSIS — Z23 Encounter for immunization: Secondary | ICD-10-CM

## 2017-12-30 NOTE — Progress Notes (Addendum)
Subjective:   Colleen Duran is a 72 y.o. female who presents for a subsequent Medicare Annual Wellness Visit. Ms Molden is very independent and lives at home with her youngest daughter and 31 year old grandson. She takes care of her grandchildren and great grandchildren and watches her 44 month old grandchild daily. She was going to the [facility] daily and using their exercise bikes and treadmill but she isn't able to go as often now that she is babysitting daily. She does try to workout at home by using the exercise bike and home weights. Her husband passed away 10 years ago from a stroke. He had been in a nursing home for years prior to passing.   Review of Systems    Health is about the same as last year  Cardiac Risk Factors include: advanced age (>77men, >70 women);hypertension;dyslipidemia;diabetes mellitus     Objective:    BP 130/75   Pulse 80   Ht 5\' 6"  (1.676 m)   Wt 207 lb (93.9 kg)   BMI 33.41 kg/m    Advanced Directives 12/30/2017 11/05/2015 09/27/2015 05/11/2015 07/24/2014 07/14/2014  Does Patient Have a Medical Advance Directive? Yes Yes;No No No No No  Type of Advance Directive Living will;Healthcare Power of Attorney - - - - -  Does patient want to make changes to medical advance directive? No - Patient declined No - Patient declined - - - -  Copy of [place] in Chart? No - copy requested - - - - -  Would patient like information on creating a medical advance directive? No - Patient declined - Yes - Educational materials given No - patient declined information No - patient declined information Yes - Educational materials given    Current Medications (verified) Outpatient Encounter Medications as of 12/30/2017  Medication Sig  . acetaminophen (TYLENOL) 500 MG tablet Take 500 mg by mouth every 6 (six) hours as needed for mild pain.   [name] amLODipine (NORVASC) 10 MG tablet Take 1 tablet (10 mg total) daily by mouth.  [name] aspirin EC 81 MG tablet Take 1 tablet (81 mg  total) by mouth daily.  . calcium-vitamin D (OSCAL WITH D) 500-200 MG-UNIT tablet Take 1 tablet by mouth.  . Continuous Blood Gluc Receiver (FREESTYLE LIBRE READER) DEVI 1 Device by Does not apply route 2 (two) times daily.  . Continuous Blood Gluc Sensor (FREESTYLE LIBRE SENSOR SYSTEM) MISC 1 Device by Does not apply route 2 (two) times daily.  [name] losartan-hydrochlorothiazide (HYZAAR) 100-12.5 MG tablet Take 1 tablet daily by mouth.  . metFORMIN (GLUCOPHAGE) 500 MG tablet Take 1 tablet (500 mg total) 2 (two) times daily with a meal by mouth.  . Multiple Vitamin (MULTIVITAMIN WITH MINERALS) TABS tablet Take 1 tablet by mouth daily.  [name] spironolactone (ALDACTONE) 25 MG tablet Take 1 tablet (25 mg total) daily by mouth.  . [DISCONTINUED] atorvastatin (LIPITOR) 40 MG tablet Take 1 tablet (40 mg total) daily at 6 PM by mouth.   No facility-administered encounter medications on file as of 12/30/2017.     Allergies (verified) Patient has no known allergies.   History: Past Medical History:  Diagnosis Date  . Arthritis    RA  . Diabetes mellitus without complication ([place])    TYPE 2  . High cholesterol   . Hypertension   . Wears glasses    Past Surgical History:  Procedure Laterality Date  . ABDOMINAL HYSTERECTOMY    . BUNIONECTOMY     bilateral feet  .  CESAREAN SECTION    . COLONOSCOPY N/A 05/11/2015   Procedure: COLONOSCOPY;  Surgeon: Danie Binder, MD;  [facility];  Service: Endoscopy;  Laterality: N/A;  2:15 PM  . 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 TOTAL KNEE ARTHROPLASTY Left 07/24/2014   Procedure: LEFT TOTAL KNEE ARTHROPLASTY;  Surgeon: Vickey Huger, MD;  Location: [facility];  Service: Orthopedics;  Laterality: Left;  . TOTAL KNEE ARTHROPLASTY Right 10/08/2015   Procedure: RIGHT TOTAL KNEE ARTHROPLASTY;  Surgeon: Vickey Huger, MD;  Location: [facility];  Service: Orthopedics;  Laterality: Right;   Family History  Problem Relation Age of Onset  . Hypertension  Mother   . Stroke Mother   . Healthy Daughter   . Cancer Sister        lung  . Cancer Brother   . Mental retardation Brother   . Healthy Daughter    Social History   Socioeconomic History  . Marital status: Widowed    Spouse name: Not on file  . Number of children: 2  . Years of education: 41  . Highest education level: 12th grade  Occupational History  . Occupation: Retired    Comment: [employer]  . Financial resource strain: Not hard at all  . Food insecurity:    Worry: Never true    Inability: Never true  . Transportation needs:    Medical: No    Non-medical: No  Tobacco Use  . Smoking status: Never Smoker  . Smokeless tobacco: Never Used  Substance and Sexual Activity  . Alcohol use: No  . Drug use: No  . Sexual activity: Not Currently  Lifestyle  . Physical activity:    Days per week: 5 days    Minutes per session: 60 min  . Stress: Not at all  Relationships  . Social connections:    Talks on phone: More than three times a week    Gets together: More than three times a week    Attends religious service: More than 4 times per year    Active member of club or organization: Yes    Attends meetings of clubs or organizations: More than 4 times per year    Relationship status: Widowed  Other Topics Concern  . Not on file  Social History Narrative  . Not on file    Tobacco Counseling No tobacco use   Activities of Daily Living In your present state of health, do you have any difficulty performing the following activities: 12/30/2017  Hearing? N  Comment Had a hearing evaluation and has a hearing aid for her right ear  Vision? N  Comment wears glasses for reading. Eye exam was about 1 year ago.  Difficulty concentrating or making decisions? N  Walking or climbing stairs? N  Dressing or bathing? N  Doing errands, shopping? N  Preparing Food and eating ? N  Using the Toilet? N  In the past six months, have you accidently  leaked urine? N  Do you have problems with loss of bowel control? N  Managing your Medications? N  Managing your Finances? N  Housekeeping or managing your Housekeeping? N  Some recent data might be hidden     Immunizations and Health Maintenance Immunization History  Administered Date(s) Administered  . Influenza, High Dose Seasonal PF 07/21/2017  . Influenza-Unspecified 06/06/2014  . Pneumococcal Conjugate-13 12/02/2016  . Pneumococcal Polysaccharide-23 10/30/2017  . Tdap 10/30/2017  . Zoster Recombinat (Shingrix) 12/30/2017  Health Maintenance Due  Topic Date Due  . FOOT EXAM  12/02/2017    Patient Care Team: [name], FNP as PCP - General (Family Medicine)  No hospitalizations, ER visits, or surgeries this past year.      Assessment:   This is a routine wellness examination for Keshana.  Hearing/Vision screen No deficits noted during visit.   Dietary issues and exercise activities discussed:  Diet Light breakfast, light lunch-sandwich, and prepares her own supper  Current Exercise Habits: Home exercise routine, Type of exercise: walking;strength training/weights, Frequency (Times/Week): 5, Intensity: Moderate, Exercise limited by: None identified  Goals    . Exercise 150 min/wk Moderate Activity      Depression Screen PHQ 2/9 Scores 12/30/2017 12/01/2017 10/30/2017 07/21/2017 04/30/2017 01/23/2017 01/08/2017  PHQ - 2 Score 0 0 0 0 6 0 0  PHQ- 9 Score - - - - 12 - -    Fall Risk Fall Risk  12/30/2017 10/30/2017 07/21/2017 04/30/2017 01/23/2017  Falls in the past year? No No No No No    Is the patient's home free of loose throw rugs in walkways, pet beds, electrical cords, etc?   yes      Grab bars in the bathroom? no      Handrails on the stairs?   yes      Adequate lighting?   yes    Cognitive Function: MMSE - Mini Mental State Exam 12/30/2017  Orientation to time 5  Orientation to Place 5  Registration 3  Attention/ Calculation 1  Recall 3   Language- name 2 objects 2  Language- repeat 1  Language- follow 3 step command 3  Language- read & follow direction 1  Write a sentence 1  Copy design 1  Total score 26  was not able to spell WORLD backwards but did not have any difficulty with other areas      Screening Tests Health Maintenance  Topic Date Due  . FOOT EXAM  12/02/2017  . INFLUENZA VACCINE  04/08/2018  . HEMOGLOBIN A1C  04/29/2018  . OPHTHALMOLOGY EXAM  05/19/2018  . MAMMOGRAM  07/14/2018  . COLONOSCOPY  05/10/2020  . TETANUS/TDAP  10/31/2027  . DEXA SCAN  Completed  . Hepatitis C Screening  Completed  . PNA vac Low Risk Adult  Completed     Plan:   Keep f/u with PCP Aim for at least 150 minutes of moderate activity a week Shingrix given today Move carefully to avoid falls  I have personally reviewed and noted the following in the patient's chart:   . Medical and social history . Use of alcohol, tobacco or illicit drugs  . Current medications and supplements . Functional ability and status . Nutritional status . Physical activity . Advanced directives . List of other physicians . Hospitalizations, surgeries, and ER visits in previous 12 months . Vitals . Screenings to include cognitive, depression, and falls . Referrals and appointments  In addition, I have reviewed and discussed with patient certain preventive protocols, quality metrics, and best practice recommendations. A written personalized care plan for preventive services as well as general preventive health recommendations were provided to patient.     Chong Sicilian, RN   01/04/2018   I have reviewed and agree with the above AWV documentation.   Evelina Dun, FNP

## 2017-12-30 NOTE — Patient Instructions (Signed)
Colleen Duran , Thank you for taking time to come for your Medicare Wellness Visit. I appreciate your ongoing commitment to your health goals. Please review the following plan we discussed and let me know if I can assist you in the future.   These are the goals we discussed: Goals    . Exercise 150 min/wk Moderate Activity       This is a list of the screening recommended for you and due dates:  Health Maintenance  Topic Date Due  . Complete foot exam   12/02/2017  . Flu Shot  04/08/2018  . Hemoglobin A1C  04/29/2018  . Eye exam for diabetics  05/19/2018  . Mammogram  07/14/2018  . Colon Cancer Screening  05/10/2020  . Tetanus Vaccine  10/31/2027  . DEXA scan (bone density measurement)  Completed  .  Hepatitis C: One time screening is recommended by Center for Disease Control  (CDC) for  adults born from [birth year] through 1967.   Completed  . Pneumonia vaccines  Completed    Recombinant Zoster (Shingles) Vaccine, RZV: What You Need to Know 1. Why get vaccinated? Shingles (also called herpes zoster, or just zoster) is a painful skin rash, often with blisters. Shingles is caused by the varicella zoster virus, the same virus that causes chickenpox. After you have chickenpox, the virus stays in your body and can cause shingles later in life. You can't catch shingles from another person. However, a person who has never had chickenpox (or chickenpox vaccine) could get chickenpox from someone with shingles. A shingles rash usually appears on one side of the face or body and heals within 2 to 4 weeks. Its main symptom is pain, which can be severe. Other symptoms can include fever, headache, chills and upset stomach. Very rarely, a shingles infection can lead to pneumonia, hearing problems, blindness, brain inflammation (encephalitis), or death. For about 1 person in 5, severe pain can continue even long after the rash has cleared up. This long-lasting pain is called post-herpetic neuralgia  (PHN). Shingles is far more common in people 38 years of age and older than in younger people, and the risk increases with age. It is also more common in people whose immune system is weakened because of a disease such as cancer, or by drugs such as steroids or chemotherapy. At least 1 million people a year in the [place] get shingles. 2. Shingles vaccine (recombinant) Recombinant shingles vaccine was approved by FDA in 2017 for the prevention of shingles. In clinical trials, it was more than 90% effective in preventing shingles. It can also reduce the likelihood of PHN. Two doses, 2 to 6 months apart, are recommended for adults 35 and older. This vaccine is also recommended for people who have already gotten the live shingles vaccine (Zostavax). There is no live virus in this vaccine. 3. Some people should not get this vaccine Tell your vaccine provider if you:  Have any severe, life-threatening allergies. A person who has ever had a life-threatening allergic reaction after a dose of recombinant shingles vaccine, or has a severe allergy to any component of this vaccine, may be advised not to be vaccinated. Ask your health care provider if you want information about vaccine components.  Are pregnant or breastfeeding. There is not much information about use of recombinant shingles vaccine in pregnant or nursing women. Your healthcare provider might recommend delaying vaccination.  Are not feeling well. If you have a mild illness, such as a cold, you can probably  get the vaccine today. If you are moderately or severely ill, you should probably wait until you recover. Your doctor can advise you.  4. Risks of a vaccine reaction With any medicine, including vaccines, there is a chance of reactions. After recombinant shingles vaccination, a person might experience:  Pain, redness, soreness, or swelling at the site of the injection  Headache, muscle aches, fever, shivering, fatigue  In  clinical trials, most people got a sore arm with mild or moderate pain after vaccination, and some also had redness and swelling where they got the shot. Some people felt tired, had muscle pain, a headache, shivering, fever, stomach pain, or nausea. About 1 out of 6 people who got recombinant zoster vaccine experienced side effects that prevented them from doing regular activities. Symptoms went away on their own in about 2 to 3 days. Side effects were more common in younger people. You should still get the second dose of recombinant zoster vaccine even if you had one of these reactions after the first dose. Other things that could happen after this vaccine:  People sometimes faint after medical procedures, including vaccination. Sitting or lying down for about 15 minutes can help prevent fainting and injuries caused by a fall. Tell your provider if you feel dizzy or have vision changes or ringing in the ears.  Some people get shoulder pain that can be more severe and longer-lasting than routine soreness that can follow injections. This happens very rarely.  Any medication can cause a severe allergic reaction. Such reactions to a vaccine are estimated at about 1 in a million doses, and would happen within a few minutes to a few hours after the vaccination. As with any medicine, there is a very remote chance of a vaccine causing a serious injury or death. The safety of vaccines is always being monitored. For more information, visit: http://www.aguilar.org/ 5. What if there is a serious problem? What should I look for?  Look for anything that concerns you, such as signs of a severe allergic reaction, very high fever, or unusual behavior. Signs of a severe allergic reaction can include hives, swelling of the face and throat, difficulty breathing, a fast heartbeat, dizziness, and weakness. These would usually start a few minutes to a few hours after the vaccination. What should I do?  If you think  it is a severe allergic reaction or other emergency that can't wait, call 9-1-1 and get to the nearest hospital. Otherwise, call your health care provider. Afterward, the reaction should be reported to the Vaccine Adverse Event Reporting System (VAERS). Your doctor should file this report, or you can do it yourself through the VAERS web site atwww.vaers.https://www.bray.com/ by calling 743-105-4544. VAERS does not give medical advice. 6. How can I learn more?  Ask your healthcare provider. He or she can give you the vaccine package insert or suggest other sources of information.  Call your local or state health department.  Contact the Centers for Disease Control and Prevention (CDC): ? Call 7804260104 (1-800-CDC-INFO) or ? Visit the CDC's website at http://hunter.com/ CDC Vaccine Information Statement (VIS) Recombinant Zoster Vaccine (10/20/2016) This information is not intended to replace advice given to you by your health care provider. Make sure you discuss any questions you have with your health care provider. Document Released: 11/04/2016 Document Revised: 11/04/2016 Document Reviewed: 11/04/2016 Elsevier Interactive Patient Education  [employer].

## 2017-12-31 ENCOUNTER — Ambulatory Visit: Payer: Medicare HMO | Admitting: *Deleted

## 2018-04-20 ENCOUNTER — Other Ambulatory Visit: Payer: Self-pay | Admitting: *Deleted

## 2018-04-20 DIAGNOSIS — E119 Type 2 diabetes mellitus without complications: Secondary | ICD-10-CM

## 2018-04-20 MED ORDER — METFORMIN HCL 500 MG PO TABS: 500.0000 mg | ORAL_TABLET | Freq: Two times a day (BID) | ORAL | 0 refills | Status: DC

## 2018-04-29 ENCOUNTER — Ambulatory Visit: Payer: Medicare HMO | Admitting: Family

## 2018-05-03 ENCOUNTER — Encounter: Payer: Self-pay | Admitting: Family

## 2018-05-03 ENCOUNTER — Ambulatory Visit (INDEPENDENT_AMBULATORY_CARE_PROVIDER_SITE_OTHER): Payer: Medicare HMO | Admitting: Family

## 2018-05-03 VITALS — BP 136/83 | HR 89 | Temp 98.1°F | Ht 66.0 in | Wt 212.2 lb

## 2018-05-03 DIAGNOSIS — E1159 Type 2 diabetes mellitus with other circulatory complications: Secondary | ICD-10-CM

## 2018-05-03 DIAGNOSIS — E1169 Type 2 diabetes mellitus with other specified complication: Secondary | ICD-10-CM | POA: Diagnosis not present

## 2018-05-03 DIAGNOSIS — M159 Polyosteoarthritis, unspecified: Secondary | ICD-10-CM

## 2018-05-03 DIAGNOSIS — E785 Hyperlipidemia, unspecified: Secondary | ICD-10-CM

## 2018-05-03 DIAGNOSIS — I10 Essential (primary) hypertension: Secondary | ICD-10-CM

## 2018-05-03 DIAGNOSIS — E669 Obesity, unspecified: Secondary | ICD-10-CM

## 2018-05-03 DIAGNOSIS — E118 Type 2 diabetes mellitus with unspecified complications: Secondary | ICD-10-CM

## 2018-05-03 DIAGNOSIS — M150 Primary generalized (osteo)arthritis: Secondary | ICD-10-CM

## 2018-05-03 LAB — BAYER DCA HB A1C WAIVED: HB A1C (BAYER DCA - WAIVED): 6.1 % (ref <7.0)

## 2018-05-03 MED ORDER — AMLODIPINE BESYLATE 10 MG PO TABS: 10.0000 mg | ORAL_TABLET | Freq: Every day | ORAL | 2 refills | Status: DC

## 2018-05-03 MED ORDER — LOSARTAN POTASSIUM-HCTZ 100-12.5 MG PO TABS: 1.0000 | ORAL_TABLET | Freq: Every day | ORAL | 3 refills | Status: DC

## 2018-05-03 NOTE — Progress Notes (Signed)
Subjective:    Patient ID: Colleen Duran, female    DOB: 05/15/1946, 72 y.o.   MRN: [id number]  Chief Complaint  Patient presents with  . Diabetes    six month recheck   Pt presents to the office today for chronic follow up.  Diabetes  She presents for her follow-up diabetic visit. She has type 2 diabetes mellitus. Her disease course has been stable. There are no hypoglycemic associated symptoms. There are no diabetic associated symptoms. Pertinent negatives for diabetes include no blurred vision, no foot paresthesias and no visual change. Symptoms are stable. Pertinent negatives for diabetic complications include no CVA, heart disease, nephropathy or peripheral neuropathy. Risk factors for coronary artery disease include dyslipidemia, diabetes mellitus, hypertension, sedentary lifestyle, post-menopausal and family history. She is following a generally healthy diet. Her overall blood glucose range is 90-110 mg/dl. Eye exam is current.  Hyperlipidemia  This is a chronic problem. The current episode started more than 1 year ago. The problem is controlled. Recent lipid tests were reviewed and are normal. Exacerbating diseases include obesity. Current antihyperlipidemic treatment includes statins. The current treatment provides moderate improvement of lipids. Risk factors for coronary artery disease include dyslipidemia, diabetes mellitus, family history, hypertension, post-menopausal and a sedentary lifestyle.  Hypertension  This is a chronic problem. The current episode started more than 1 year ago. The problem has been resolved since onset. The problem is controlled. Associated symptoms include peripheral edema. Pertinent negatives include no blurred vision or malaise/fatigue. Risk factors for coronary artery disease include diabetes mellitus, dyslipidemia, family history, obesity and sedentary lifestyle. The current treatment provides moderate improvement. There is no history of kidney disease,  CAD/MI, CVA or heart failure.  Arthritis  Presents for follow-up visit. She complains of pain and stiffness. The symptoms have been stable. Affected location: back. Her pain is at a severity of 6/10.      Review of Systems  Constitutional: Negative for malaise/fatigue.  Eyes: Negative for blurred vision.  Musculoskeletal: Positive for arthritis and stiffness.  All other systems reviewed and are negative.      Objective:   Physical Exam  Constitutional: She is oriented to person, place, and time. She appears well-developed and well-nourished. No distress.  HENT:  Head: Normocephalic and atraumatic.  Right Ear: External ear normal.  Left Ear: External ear normal.  Mouth/Throat: Oropharynx is clear and moist.  Eyes: Pupils are equal, round, and reactive to light.  Neck: Normal range of motion. Neck supple. No thyromegaly present.  Cardiovascular: Normal rate, regular rhythm, normal heart sounds and intact distal pulses.  No murmur heard. Pulmonary/Chest: Effort normal and breath sounds normal. No respiratory distress. She has no wheezes.  Abdominal: Soft. Bowel sounds are normal. She exhibits no distension. There is no tenderness.  Musculoskeletal: Normal range of motion. She exhibits no edema or tenderness.  Neurological: She is alert and oriented to person, place, and time. She has normal reflexes. No cranial nerve deficit.  Skin: Skin is warm and dry.  Psychiatric: She has a normal mood and affect. Her behavior is normal. Judgment and thought content normal.  Vitals reviewed.   Diabetic Foot Exam - Simple   Simple Foot Form Diabetic Foot exam was performed with the following findings:  Yes 05/03/2018 11:03 AM  Visual Inspection No deformities, no ulcerations, no other skin breakdown bilaterally:  Yes Sensation Testing Intact to touch and monofilament testing bilaterally:  Yes Pulse Check Posterior Tibialis and Dorsalis pulse intact bilaterally:  Yes Comments  BP  136/83   Pulse 89   Temp 98.1 F (36.7 C) (Oral)   Ht '5\' 6"'$  (1.676 m)   Wt 212 lb 3.2 oz (96.3 kg)   BMI 34.25 kg/m      Assessment & Plan:  LAYA LETENDRE comes in today with chief complaint of Diabetes (six month recheck)   Diagnosis and orders addressed:  1. Type 2 diabetes mellitus with complication, without long-term current use of insulin ([facility]) - Bayer DCA Hb A1c Waived - CMP14+EGFR - CBC with Differential/Platelet - Microalbumin / creatinine urine ratio  2. Hyperlipidemia associated with type 2 diabetes mellitus ([facility]) - CMP14+EGFR - Lipid panel - CBC with Differential/Platelet  3. Hypertension associated with diabetes ([place]) - CMP14+EGFR - amLODipine (NORVASC) 10 MG tablet; Take 1 tablet (10 mg total) by mouth daily.  Dispense: 90 tablet; Refill: 2 - losartan-hydrochlorothiazide (HYZAAR) 100-12.5 MG tablet; Take 1 tablet by mouth daily.  Dispense: 90 tablet; Refill: 3 - CBC with Differential/Platelet  4. Obesity (BMI 30-39.9) - CMP14+EGFR - CBC with Differential/Platelet  5. Primary osteoarthritis involving multiple joints - CMP14+EGFR - CBC with Differential/Platelet   Labs pending Health Maintenance reviewed Diet and exercise encouraged  Follow up plan: 6 months   Evelina Dun, FNP

## 2018-05-03 NOTE — Patient Instructions (Signed)

## 2018-05-04 ENCOUNTER — Telehealth: Payer: Self-pay | Admitting: Family

## 2018-05-04 LAB — CBC WITH DIFFERENTIAL/PLATELET
Basophils Absolute: 0 10*3/uL (ref 0.0–0.2)
Basos: 0 %
EOS (ABSOLUTE): 0.2 10*3/uL (ref 0.0–0.4)
EOS: 4 %
HEMATOCRIT: 37.4 % (ref 34.0–46.6)
Hemoglobin: 12.3 g/dL (ref 11.1–15.9)
IMMATURE GRANULOCYTES: 0 %
Immature Grans (Abs): 0 10*3/uL (ref 0.0–0.1)
Lymphocytes Absolute: 1.8 10*3/uL (ref 0.7–3.1)
Lymphs: 33 %
MCH: 29.6 pg (ref 26.6–33.0)
MCHC: 32.9 g/dL (ref 31.5–35.7)
MCV: 90 fL (ref 79–97)
MONOCYTES: 7 %
Monocytes Absolute: 0.4 10*3/uL (ref 0.1–0.9)
Neutrophils Absolute: 3 10*3/uL (ref 1.4–7.0)
Neutrophils: 56 %
Platelets: 282 10*3/uL (ref 150–450)
RBC: 4.16 x10E6/uL (ref 3.77–5.28)
RDW: 14.5 % (ref 12.3–15.4)
WBC: 5.4 10*3/uL (ref 3.4–10.8)

## 2018-05-04 LAB — CMP14+EGFR
A/G RATIO: 1.3 (ref 1.2–2.2)
ALK PHOS: 79 IU/L (ref 39–117)
ALT: 16 IU/L (ref 0–32)
AST: 19 IU/L (ref 0–40)
Albumin: 4.4 g/dL (ref 3.5–4.8)
BILIRUBIN TOTAL: 0.3 mg/dL (ref 0.0–1.2)
BUN / CREAT RATIO: 14 (ref 12–28)
BUN: 12 mg/dL (ref 8–27)
CHLORIDE: 99 mmol/L (ref 96–106)
CO2: 23 mmol/L (ref 20–29)
Calcium: 9.9 mg/dL (ref 8.7–10.3)
Creatinine, Ser: 0.84 mg/dL (ref 0.57–1.00)
GFR calc non Af Amer: 69 mL/min/{1.73_m2} (ref >59)
GFR, EST AFRICAN AMERICAN: 80 mL/min/{1.73_m2} (ref >59)
GLUCOSE: 96 mg/dL (ref 65–99)
Globulin, Total: 3.3 g/dL (ref 1.5–4.5)
POTASSIUM: 4 mmol/L (ref 3.5–5.2)
Sodium: 137 mmol/L (ref 134–144)
TOTAL PROTEIN: 7.7 g/dL (ref 6.0–8.5)

## 2018-05-04 LAB — MICROALBUMIN / CREATININE URINE RATIO
Creatinine, Urine: 40.8 mg/dL
MICROALB/CREAT RATIO: 37.3 mg/g{creat} — AB (ref 0.0–30.0)
MICROALBUM., U, RANDOM: 15.2 ug/mL

## 2018-05-04 LAB — LIPID PANEL
CHOLESTEROL TOTAL: 198 mg/dL (ref 100–199)
Chol/HDL Ratio: 3.4 ratio (ref 0.0–4.4)
HDL: 58 mg/dL (ref >39)
LDL Calculated: 98 mg/dL (ref 0–99)
Triglycerides: 210 mg/dL — ABNORMAL HIGH (ref 0–149)
VLDL Cholesterol Cal: 42 mg/dL — ABNORMAL HIGH (ref 5–40)

## 2018-05-04 NOTE — Telephone Encounter (Signed)
Aware of all lab results. 

## 2018-06-16 ENCOUNTER — Ambulatory Visit (INDEPENDENT_AMBULATORY_CARE_PROVIDER_SITE_OTHER): Payer: Medicare HMO | Admitting: Physician Assistant

## 2018-06-16 ENCOUNTER — Encounter: Payer: Self-pay | Admitting: Physician Assistant

## 2018-06-16 VITALS — BP 147/87 | HR 81 | Temp 98.1°F | Ht 66.0 in | Wt 206.4 lb

## 2018-06-16 DIAGNOSIS — M546 Pain in thoracic spine: Secondary | ICD-10-CM

## 2018-06-16 DIAGNOSIS — Z23 Encounter for immunization: Secondary | ICD-10-CM

## 2018-06-16 MED ORDER — PREDNISONE 10 MG (21) PO TBPK: ORAL_TABLET | ORAL | 0 refills | Status: DC

## 2018-06-16 NOTE — Progress Notes (Signed)
BP (!) 147/87   Pulse 81   Temp 98.1 F (36.7 C) (Oral)   Ht 5\' 6"  (1.676 m)   Wt 206 lb 6.4 oz (93.6 kg)   BMI 33.31 kg/m    Subjective:    Patient ID: Colleen Duran, female    DOB: Apr 09, 1946, 72 y.o.   MRN: [id number]  HPI: Colleen Duran is a 72 y.o. female presenting on 06/16/2018 for Pain under left breast  This patient comes in today having left chest wall pain under the left breast.  It hurts whenever she has movement.  When she goes from sitting to standing it will bother her.  She has been going on for about 2 weeks.  She denies any injury.  She has not had any rash on her skin.  She does not know of any degenerative disc disease that she is ever had before to cause a pinched nerve.  She states that her bowel movements and bladder are normal.  She denies any fever or chills no nausea vomiting or diarrhea.  Past Medical History:  Diagnosis Date  . Arthritis    RA  . Diabetes mellitus without complication ([place])    TYPE 2  . High cholesterol   . Hypertension   . Wears glasses    Relevant past medical, surgical, family and social history reviewed and updated as indicated. Interim medical history since our last visit reviewed. Allergies and medications reviewed and updated. DATA REVIEWED: CHART IN EPIC  Family History reviewed for pertinent findings.  Review of Systems  Constitutional: Negative.   HENT: Negative.   Eyes: Negative.   Respiratory: Negative.  Negative for cough, shortness of breath and wheezing.   Cardiovascular: Positive for chest pain. Negative for palpitations and leg swelling.  Gastrointestinal: Negative.   Genitourinary: Negative.   Musculoskeletal: Positive for myalgias. Negative for arthralgias and back pain.    Allergies as of 06/16/2018   No Known Allergies     Medication List        Accurate as of 06/16/18  2:35 PM. Always use your most recent med list.          acetaminophen 500 MG tablet Commonly known as:  TYLENOL Take 500 mg  by mouth every 6 (six) hours as needed for mild pain.   amLODipine 10 MG tablet Commonly known as:  NORVASC Take 1 tablet (10 mg total) by mouth daily.   aspirin EC 81 MG tablet Take 1 tablet (81 mg total) by mouth daily.   calcium-vitamin D 500-200 MG-UNIT tablet Commonly known as:  OSCAL WITH D Take 1 tablet by mouth.   losartan-hydrochlorothiazide 100-12.5 MG tablet Commonly known as:  HYZAAR Take 1 tablet by mouth daily.   metFORMIN 500 MG tablet Commonly known as:  GLUCOPHAGE Take 1 tablet (500 mg total) by mouth 2 (two) times daily with a meal.   multivitamin with minerals Tabs tablet Take 1 tablet by mouth daily.   predniSONE 10 MG (21) Tbpk tablet Commonly known as:  STERAPRED UNI-PAK 21 TAB As directed x 6 days          Objective:    BP (!) 147/87   Pulse 81   Temp 98.1 F (36.7 C) (Oral)   Ht 5\' 6"  (1.676 m)   Wt 206 lb 6.4 oz (93.6 kg)   BMI 33.31 kg/m   No Known Allergies  Wt Readings from Last 3 Encounters:  06/16/18 206 lb 6.4 oz (93.6 kg)  05/03/18  212 lb 3.2 oz (96.3 kg)  01/04/18 207 lb (93.9 kg)    Physical Exam  Constitutional: She is oriented to person, place, and time. She appears well-developed and well-nourished.  HENT:  Head: Normocephalic and atraumatic.  Eyes: Pupils are equal, round, and reactive to light. Conjunctivae and EOM are normal.  Cardiovascular: Normal rate, regular rhythm, normal heart sounds and intact distal pulses. PMI is not displaced.    Left lower costal region with some tenderness to the touch.  She is able to have full range of motion during the exam.  No deformity is seen in her chest wall.  No rash on her skin.  Pulmonary/Chest: Effort normal and breath sounds normal.  Abdominal: Soft. Bowel sounds are normal.  Neurological: She is alert and oriented to person, place, and time. She has normal reflexes.  Skin: Skin is warm and dry. No rash noted.  Psychiatric: She has a normal mood and affect. Her behavior is  normal. Judgment and thought content normal.    Results for orders placed or performed in visit on 05/31/18  HM MAMMOGRAPHY  Result Value Ref Range   HM Mammogram 0-4 Bi-Rad 0-4 Bi-Rad, Self Reported Normal      Assessment & Plan:   1. Acute left-sided thoracic back pain - predniSONE (STERAPRED UNI-PAK 21 TAB) 10 MG (21) TBPK tablet; As directed x 6 days  Dispense: 21 tablet; Refill: 0  2. Encounter for immunization - Flu vaccine HIGH DOSE PF   Continue all other maintenance medications as listed above.  Follow up plan: No follow-ups on file.  Educational handout given for [place] PA-C [place] [address]  [place], [place] [address] [phone]   06/16/2018, 2:35 PM

## 2018-07-12 DIAGNOSIS — E119 Type 2 diabetes mellitus without complications: Secondary | ICD-10-CM | POA: Diagnosis not present

## 2018-07-12 DIAGNOSIS — Z7984 Long term (current) use of oral hypoglycemic drugs: Secondary | ICD-10-CM | POA: Diagnosis not present

## 2018-07-12 DIAGNOSIS — H2513 Age-related nuclear cataract, bilateral: Secondary | ICD-10-CM | POA: Diagnosis not present

## 2018-07-27 DIAGNOSIS — H25811 Combined forms of age-related cataract, right eye: Secondary | ICD-10-CM | POA: Diagnosis not present

## 2018-07-27 DIAGNOSIS — E119 Type 2 diabetes mellitus without complications: Secondary | ICD-10-CM | POA: Diagnosis not present

## 2018-07-27 DIAGNOSIS — H2512 Age-related nuclear cataract, left eye: Secondary | ICD-10-CM | POA: Diagnosis not present

## 2018-07-27 DIAGNOSIS — Z01818 Encounter for other preprocedural examination: Secondary | ICD-10-CM | POA: Diagnosis not present

## 2018-08-19 DIAGNOSIS — H2511 Age-related nuclear cataract, right eye: Secondary | ICD-10-CM | POA: Diagnosis not present

## 2018-08-19 DIAGNOSIS — H25811 Combined forms of age-related cataract, right eye: Secondary | ICD-10-CM | POA: Diagnosis not present

## 2018-08-27 DIAGNOSIS — H2511 Age-related nuclear cataract, right eye: Secondary | ICD-10-CM | POA: Diagnosis not present

## 2018-08-27 DIAGNOSIS — H2512 Age-related nuclear cataract, left eye: Secondary | ICD-10-CM | POA: Diagnosis not present

## 2018-08-27 DIAGNOSIS — H25812 Combined forms of age-related cataract, left eye: Secondary | ICD-10-CM | POA: Diagnosis not present

## 2018-10-15 DIAGNOSIS — Z0100 Encounter for examination of eyes and vision without abnormal findings: Secondary | ICD-10-CM | POA: Diagnosis not present

## 2018-11-02 ENCOUNTER — Other Ambulatory Visit: Payer: Self-pay | Admitting: *Deleted

## 2018-11-02 DIAGNOSIS — E1159 Type 2 diabetes mellitus with other circulatory complications: Secondary | ICD-10-CM

## 2018-11-02 DIAGNOSIS — I152 Hypertension secondary to endocrine disorders: Secondary | ICD-10-CM

## 2018-11-02 DIAGNOSIS — I10 Essential (primary) hypertension: Principal | ICD-10-CM

## 2018-11-02 MED ORDER — AMLODIPINE BESYLATE 10 MG PO TABS: 10.0000 mg | ORAL_TABLET | Freq: Every day | ORAL | 0 refills | Status: DC

## 2018-11-04 ENCOUNTER — Ambulatory Visit: Payer: Medicare HMO | Admitting: Family

## 2018-11-09 ENCOUNTER — Ambulatory Visit (INDEPENDENT_AMBULATORY_CARE_PROVIDER_SITE_OTHER): Payer: Medicare HMO | Admitting: Family

## 2018-11-09 ENCOUNTER — Encounter: Payer: Self-pay | Admitting: Family

## 2018-11-09 VITALS — BP 149/82 | HR 79 | Temp 97.0°F | Ht 66.0 in | Wt 214.6 lb

## 2018-11-09 DIAGNOSIS — E785 Hyperlipidemia, unspecified: Secondary | ICD-10-CM

## 2018-11-09 DIAGNOSIS — I10 Essential (primary) hypertension: Secondary | ICD-10-CM | POA: Diagnosis not present

## 2018-11-09 DIAGNOSIS — E669 Obesity, unspecified: Secondary | ICD-10-CM

## 2018-11-09 DIAGNOSIS — E1159 Type 2 diabetes mellitus with other circulatory complications: Secondary | ICD-10-CM

## 2018-11-09 DIAGNOSIS — E1169 Type 2 diabetes mellitus with other specified complication: Secondary | ICD-10-CM | POA: Diagnosis not present

## 2018-11-09 DIAGNOSIS — I152 Hypertension secondary to endocrine disorders: Secondary | ICD-10-CM

## 2018-11-09 DIAGNOSIS — M159 Polyosteoarthritis, unspecified: Secondary | ICD-10-CM

## 2018-11-09 DIAGNOSIS — M150 Primary generalized (osteo)arthritis: Secondary | ICD-10-CM | POA: Diagnosis not present

## 2018-11-09 LAB — BAYER DCA HB A1C WAIVED: HB A1C (BAYER DCA - WAIVED): 6.3 % (ref <7.0)

## 2018-11-09 NOTE — Patient Instructions (Signed)
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
## 2018-11-10 LAB — CMP14+EGFR
ALBUMIN: 4.1 g/dL (ref 3.7–4.7)
ALT: 26 IU/L (ref 0–32)
AST: 24 IU/L (ref 0–40)
Albumin/Globulin Ratio: 1.3 (ref 1.2–2.2)
Alkaline Phosphatase: 89 IU/L (ref 39–117)
BILIRUBIN TOTAL: 0.3 mg/dL (ref 0.0–1.2)
BUN / CREAT RATIO: 18 (ref 12–28)
BUN: 14 mg/dL (ref 8–27)
CHLORIDE: 101 mmol/L (ref 96–106)
CO2: 23 mmol/L (ref 20–29)
CREATININE: 0.8 mg/dL (ref 0.57–1.00)
Calcium: 9.5 mg/dL (ref 8.7–10.3)
GFR, EST AFRICAN AMERICAN: 84 mL/min/{1.73_m2} (ref >59)
GFR, EST NON AFRICAN AMERICAN: 73 mL/min/{1.73_m2} (ref >59)
GLUCOSE: 87 mg/dL (ref 65–99)
Globulin, Total: 3.2 g/dL (ref 1.5–4.5)
Potassium: 4.4 mmol/L (ref 3.5–5.2)
Sodium: 140 mmol/L (ref 134–144)
TOTAL PROTEIN: 7.3 g/dL (ref 6.0–8.5)

## 2018-11-10 LAB — LIPID PANEL
Chol/HDL Ratio: 3.3 ratio (ref 0.0–4.4)
Cholesterol, Total: 207 mg/dL — ABNORMAL HIGH (ref 100–199)
HDL: 62 mg/dL (ref >39)
LDL CALC: 106 mg/dL — AB (ref 0–99)
Triglycerides: 196 mg/dL — ABNORMAL HIGH (ref 0–149)
VLDL CHOLESTEROL CAL: 39 mg/dL (ref 5–40)

## 2018-11-10 LAB — CBC WITH DIFFERENTIAL/PLATELET
Basophils Absolute: 0 10*3/uL (ref 0.0–0.2)
Basos: 1 %
EOS (ABSOLUTE): 0.3 10*3/uL (ref 0.0–0.4)
EOS: 6 %
HEMATOCRIT: 35.6 % (ref 34.0–46.6)
HEMOGLOBIN: 12.7 g/dL (ref 11.1–15.9)
Immature Grans (Abs): 0 10*3/uL (ref 0.0–0.1)
Immature Granulocytes: 0 %
LYMPHS ABS: 1.9 10*3/uL (ref 0.7–3.1)
Lymphs: 41 %
MCH: 30.8 pg (ref 26.6–33.0)
MCHC: 35.7 g/dL (ref 31.5–35.7)
MCV: 86 fL (ref 79–97)
MONOCYTES: 9 %
Monocytes Absolute: 0.4 10*3/uL (ref 0.1–0.9)
NEUTROS ABS: 2.1 10*3/uL (ref 1.4–7.0)
Neutrophils: 43 %
Platelets: 251 10*3/uL (ref 150–450)
RBC: 4.12 x10E6/uL (ref 3.77–5.28)
RDW: 13.8 % (ref 11.7–15.4)
WBC: 4.8 10*3/uL (ref 3.4–10.8)

## 2018-11-11 ENCOUNTER — Other Ambulatory Visit: Payer: Self-pay | Admitting: Family

## 2018-11-11 MED ORDER — ATORVASTATIN CALCIUM 20 MG PO TABS: 20.0000 mg | ORAL_TABLET | Freq: Every day | ORAL | 3 refills | Status: DC

## 2018-11-16 ENCOUNTER — Other Ambulatory Visit: Payer: Self-pay | Admitting: *Deleted

## 2018-11-16 DIAGNOSIS — E119 Type 2 diabetes mellitus without complications: Secondary | ICD-10-CM

## 2018-11-16 MED ORDER — METFORMIN HCL 500 MG PO TABS: 500.0000 mg | ORAL_TABLET | Freq: Two times a day (BID) | ORAL | 0 refills | Status: DC

## 2018-12-23 ENCOUNTER — Other Ambulatory Visit: Payer: Self-pay | Admitting: Family

## 2018-12-23 DIAGNOSIS — E119 Type 2 diabetes mellitus without complications: Secondary | ICD-10-CM

## 2019-03-30 ENCOUNTER — Telehealth: Payer: Self-pay | Admitting: Family

## 2019-03-30 NOTE — Chronic Care Management (AMB) (Signed)
Chronic Care Management   Note  03/30/2019 Name: Colleen Duran MRN: [id number] DOB: [birth year]  Colleen Duran is a 73 y.o. year old female who is a primary care patient of [name], FNP. I reached out to [name] by phone today in response to a referral sent by [name] health plan.    [name] was given information about Chronic Care Management services today including:  1. CCM service includes personalized support from designated clinical staff supervised by her physician, including individualized plan of care and coordination with other care providers 2. 24/7 contact phone numbers for assistance for urgent and routine care needs. 3. Service will only be billed when office clinical staff spend 20 minutes or more in a month to coordinate care. 4. Only one practitioner may furnish and bill the service in a calendar month. 5. The patient may stop CCM services at any time (effective at the end of the month) by phone call to the office staff. 6. The patient will be responsible for cost sharing (co-pay) of up to 20% of the service fee (after annual deductible is met).  Patient agreed to services and verbal consent obtained.   Follow up plan: Telephone appointment with CCM team member scheduled for: 04/06/2019  [place]  ??[email or web address]   ??[phone]

## 2019-04-06 ENCOUNTER — Ambulatory Visit: Payer: Medicare HMO | Admitting: *Deleted

## 2019-04-06 VITALS — BP 143/88

## 2019-04-06 DIAGNOSIS — M159 Polyosteoarthritis, unspecified: Secondary | ICD-10-CM

## 2019-04-06 DIAGNOSIS — E1169 Type 2 diabetes mellitus with other specified complication: Secondary | ICD-10-CM

## 2019-04-06 DIAGNOSIS — E1159 Type 2 diabetes mellitus with other circulatory complications: Secondary | ICD-10-CM

## 2019-04-06 NOTE — Chronic Care Management (AMB) (Addendum)
Chronic Care Management   Initial Visit Note  04/06/2019 Name: Colleen Duran MRN: [id number] DOB: 1946-07-17  Referred by: [name], FNP Reason for referral : Chronic Care Management (RNCM Initial Visit)   Colleen Duran is a 73 y.o. year old female who is a primary care patient of [name], FNP. The CCM team was consulted for assistance with chronic disease management and care coordination needs.   Review of patient status, including review of consultants reports, relevant laboratory and other test results, and collaboration with appropriate care team members and the patient's provider was performed as part of comprehensive patient evaluation and provision of chronic care management services.    SDOH (Social Determinants of Health) screening performed today. See Care Plan Entry related to challenges with: None  Subjective: "I feel like I'm managing my health well right now." I spoke with [name] by telephone today. She lives with her daughter and grandchildren. She checks her blood pressure and blood sugar regularly and reports mostly normal readings.   Objective:  BP Readings from Last 3 Encounters:  04/06/19 (!) 143/88  11/09/18 (!) 149/82  06/16/18 (!) 147/87   Lab Results  Component Value Date   HGBA1C 6.3 11/09/2018   HGBA1C 6.1 05/03/2018   HGBA1C 6.0 10/30/2017   Lab Results  Component Value Date   LDLCALC 106 (H) 11/09/2018   CREATININE 0.80 11/09/2018    Medication List acetaminophen 500 MG tablet Commonly known as: TYLENOL Take 500 mg by mouth every 6 (six) hours as needed for mild pain.   amLODipine 10 MG tablet Commonly known as: NORVASC Take 1 tablet (10 mg total) by mouth daily.   aspirin EC 81 MG tablet Take 1 tablet (81 mg total) by mouth daily.   atorvastatin 20 MG tablet Commonly known as: LIPITOR Take 1 tablet (20 mg total) by mouth daily.   calcium-vitamin D 500-200 MG-UNIT tablet Commonly known as: OSCAL WITH D Take 1 tablet by  mouth.   losartan-hydrochlorothiazide 100-12.5 MG tablet Commonly known as: HYZAAR Take 1 tablet by mouth daily.   metFORMIN 500 MG tablet Commonly known as: GLUCOPHAGE TAKE ONE TABLET BY MOUTH TWICE DAILY. TAKE WITH A MEAL.   multivitamin with minerals Tabs tablet Take 1 tablet by mouth daily.     Assessment: Goals Addressed    . "I want to keep my arthritis pain under control" (pt-stated)       Patient has some arthritis pain but she controls with extra strength Tylenol and often takes it at scheduled increments in order to stay ahead of the pain.   Current Barriers:  . Chronic Disease Management support and education needs related to arthritis  Nurse Case Manager Clinical Goal(s):  [name] Over the next 30 days, patient will work with Consulting civil engineer to address needs related to arthritis.   Interventions:  . Advised patient to continue taking extra strength Tylenol as needed for joint pain . Advised to report any new or worsening symptoms by calling RNCM at [phone] or PCP at [phone].  Patient Self Care Activities:  . Performs ADL's independently . Performs IADL's independently  Initial goal documentation     . "I want to keep the swelling in my ankles under control" (pt-stated)       Current Barriers:  . Chronic Disease Management support and education needs related to management of ankle swelling  Nurse Case Manager Clinical Goal(s):  [name] Over the next 30 days, the patient will demonstrate ongoing self  health care management ability as evidenced by ankle swelling that continues to resolve with rest and elevation.*  Interventions:  . Questioned swelling characteristics o Mild swelling o In both ankles primarily after standing o Improves with elevation o Denies pain  Patient Self Care Activities:  . Performs ADL's independently . Performs IADL's independently  Initial goal documentation     . "I would like to continue being independent and maintain or  improve my level of health" (pt-stated)       Patient is active and feels that her health is well managed. She lives with her daughter and grandchildren and is dealing with Covid-19 social distancing guidelines well. She doesn't feel socially isolated or depressed.  Current Barriers:  . None  Nurse Case Manager Clinical Goal(s):  [name] Over the next 30 days, patient will work with Consulting civil engineer to address needs related to self management of chronic medical conditions.  . Over the next 30 days, the patient will demonstrate ongoing self health care management ability as evidenced by maintaining her current level of independence.*  Interventions:  . Assessed current activity level: o Walks around the track in town 20 min to 1 hour 3 times a week. Typically walks early before it gets too hot . Encouraged continued physical activity . Advised patient to  avoid outdoor activities during the heat of the day and to limit any time spent outdoors until the heat and humidity have decreased.  . Discussed plans with patient for ongoing care management follow up and provided patient with direct contact information for care management team . Falls assessment completed . Falls prevention discussed  Patient Self Care Activities:  . Performs ADL's independently . Performs IADL's independently  Initial goal documentation       Plan:  The care management team will reach out to the patient again over the next 30 days.   RNCM contact information provided and patient advised to reach out if needed   Chong Sicilian BSN, RN-BC [place] [facility] [place]: [phone]    I have reviewed and agree with the above  documentation.   Evelina Dun, FNP

## 2019-04-06 NOTE — Patient Instructions (Signed)
Visit Information  Goals Addressed            This Visit's Progress     Patient Stated   . "I want to keep my arthritis pain under control" (pt-stated)       Current Barriers:  . Chronic Disease Management support and education needs related to arthritis  Nurse Case Manager Clinical Goal(s):  [name] Over the next 30 days, patient will work with Consulting civil engineer to address needs related to arthritis.   Interventions:  . Advised patient to continue taking extra strength Tylenol as needed for joint pain . Advised to report any new or worsening symptoms by calling RNCM at [phone] or PCP at [phone].  Patient Self Care Activities:  . Performs ADL's independently . Performs IADL's independently  Initial goal documentation     . "I want to keep the swelling in my ankles under control" (pt-stated)       Current Barriers:  . Chronic Disease Management support and education needs related to management of ankle swelling  Nurse Case Manager Clinical Goal(s):  [name] Over the next 30 days, the patient will demonstrate ongoing self health care management ability as evidenced by ankle swelling that continues to resolve with rest and elevation.*  Interventions:  . Questioned swelling characteristics o Mild swelling o In both ankles primarily after standing o Improves with elevation o Denies pain  Patient Self Care Activities:  . Performs ADL's independently . Performs IADL's independently  Initial goal documentation     . "I would like to continue being independent and maintain or improve my level of health" (pt-stated)       Current Barriers:  . None  Nurse Case Manager Clinical Goal(s):  [name] Over the next 30 days, patient will work with Consulting civil engineer to address needs related to self management of chronic medical conditions.  . Over the next 30 days, the patient will demonstrate ongoing self health care management ability as evidenced by maintaining her current level of  independence.*  Interventions:  . Assessed current activity level: o Walks around the track in town 20 min to 1 hour 3 times a week. Typically walks early before it gets too hot . Encouraged continued physical activity . Advised patient to  avoid outdoor activities during the heat of the day and to limit any time spent outdoors until the heat and humidity have decreased.  . Discussed plans with patient for ongoing care management follow up and provided patient with direct contact information for care management team . Fall assessment completed . Falls prevention discussed  Patient Self Care Activities:  . Performs ADL's independently . Performs IADL's independently  Initial goal documentation        The patient verbalized understanding of instructions provided today and declined a print copy of patient instruction materials.   The care management team will reach out to the patient again over the next 30 days.   RNCM contact information provided   Chong Sicilian BSN, RN-BC [place] [facility] [place] [facility] Direct Dial: [phone]

## 2019-04-21 ENCOUNTER — Other Ambulatory Visit: Payer: Self-pay | Admitting: Family

## 2019-04-21 DIAGNOSIS — I152 Hypertension secondary to endocrine disorders: Secondary | ICD-10-CM

## 2019-04-21 DIAGNOSIS — E1159 Type 2 diabetes mellitus with other circulatory complications: Secondary | ICD-10-CM

## 2019-04-21 DIAGNOSIS — E119 Type 2 diabetes mellitus without complications: Secondary | ICD-10-CM

## 2019-04-22 ENCOUNTER — Other Ambulatory Visit: Payer: Self-pay

## 2019-04-22 ENCOUNTER — Ambulatory Visit (INDEPENDENT_AMBULATORY_CARE_PROVIDER_SITE_OTHER): Payer: Medicare HMO | Admitting: *Deleted

## 2019-04-22 DIAGNOSIS — Z0000 Encounter for general adult medical examination without abnormal findings: Secondary | ICD-10-CM

## 2019-04-22 NOTE — Progress Notes (Addendum)
..     MEDICARE ANNUAL WELLNESS VISIT  04/22/2019  Telephone Visit Disclaimer This Medicare AWV was conducted by telephone due to national recommendations for restrictions regarding the COVID-19 Pandemic (e.g. social distancing).  I verified, using two identifiers, that I am speaking with Colleen Duran or their authorized healthcare agent. I discussed the limitations, risks, security, and privacy concerns of performing an evaluation and management service by telephone and the potential availability of an in-person appointment in the future. The patient expressed understanding and agreed to proceed.   Subjective:  Colleen Duran is a 73 y.o. female patient of Hawks, Theador Hawthorne, FNP who had a Medicare Annual Wellness Visit today via telephone. Colleen Duran is Retired and lives with their daughter. she has 2 children. she reports that she is socially active and does interact with friends/family regularly. she is minimally physically active and enjoys window shopping.  Patient Care Team: [name], FNP as PCP - General (Family Medicine) Ilean China, RN as Case Manager  Advanced Directives 04/22/2019 04/22/2019 12/30/2017 11/05/2015 09/27/2015 05/11/2015 07/24/2014  Does Patient Have a Medical Advance Directive? Yes Yes Yes Yes;No No No No  Type of Advance Directive Living will Living will Living will;Healthcare Power of Attorney - - - -  Does patient want to make changes to medical advance directive? No - Patient declined No - Patient declined No - Patient declined No - Patient declined - - -  Copy of [place] in Chart? - - No - copy requested - - - -  Would patient like information on creating a medical advance directive? - - No - Patient declined - Yes - Educational materials given No - patient declined information No - patient declined information    Hospital Utilization Over the Past 12 Months: # of hospitalizations or ER visits: 0 # of surgeries: 0  Review of Systems     Patient reports that her overall health is unchanged compared to last year.  Patient Reported Readings (BP, Pulse, CBG, Weight, etc) none  Review of Systems: History obtained from the patient  All other systems negative.  Pain Assessment       Current Medications & Allergies (verified) Allergies as of 04/22/2019   No Known Allergies     Medication List       Accurate as of April 22, 2019  4:06 PM. If you have any questions, ask your nurse or doctor.        acetaminophen 500 MG tablet Commonly known as: TYLENOL Take 500 mg by mouth every 6 (six) hours as needed for mild pain.   amLODipine 10 MG tablet Commonly known as: NORVASC TAKE ONE TABLET BY MOUTH DAILY.   aspirin EC 81 MG tablet Take 1 tablet (81 mg total) by mouth daily.   atorvastatin 20 MG tablet Commonly known as: LIPITOR Take 1 tablet (20 mg total) by mouth daily.   calcium-vitamin D 500-200 MG-UNIT tablet Commonly known as: OSCAL WITH D Take 1 tablet by mouth.   losartan-hydrochlorothiazide 100-12.5 MG tablet Commonly known as: HYZAAR Take 1 tablet by mouth daily.   metFORMIN 500 MG tablet Commonly known as: GLUCOPHAGE TAKE ONE TABLET BY MOUTH TWICE DAILY. TAKE WITH A MEAL.   multivitamin with minerals Tabs tablet Take 1 tablet by mouth daily.       History (reviewed): Past Medical History:  Diagnosis Date  . Arthritis    RA  . Cataract    Bi-lateral, [place], [place]  .  Diabetes mellitus without complication ([place])    TYPE 2  . High cholesterol   . Hypertension   . Wears glasses    Past Surgical History:  Procedure Laterality Date  . ABDOMINAL HYSTERECTOMY    . BUNIONECTOMY     bilateral feet  . CESAREAN SECTION    . COLONOSCOPY N/A 05/11/2015   Procedure: COLONOSCOPY;  Surgeon: Danie Binder, MD;  [facility];  Service: Endoscopy;  Laterality: N/A;  2:15 PM  . 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 TOTAL KNEE ARTHROPLASTY Left  07/24/2014   Procedure: LEFT TOTAL KNEE ARTHROPLASTY;  Surgeon: Vickey Huger, MD;  Location: [facility];  Service: Orthopedics;  Laterality: Left;  . TOTAL KNEE ARTHROPLASTY Right 10/08/2015   Procedure: RIGHT TOTAL KNEE ARTHROPLASTY;  Surgeon: Vickey Huger, MD;  Location: [facility];  Service: Orthopedics;  Laterality: Right;   Family History  Problem Relation Age of Onset  . Hypertension Mother   . Stroke Mother   . Healthy Daughter   . Cancer Sister        lung  . Cancer Brother   . Mental retardation Brother   . Healthy Daughter    Social History   Socioeconomic History  . Marital status: Widowed    Spouse name: Not on file  . Number of children: 2  . Years of education: 49  . Highest education level: 12th grade  Occupational History  . Occupation: Retired    Comment: [employer]  . Financial resource strain: Not hard at all  . Food insecurity    Worry: Never true    Inability: Never true  . Transportation needs    Medical: No    Non-medical: No  Tobacco Use  . Smoking status: Never Smoker  . Smokeless tobacco: Never Used  Substance and Sexual Activity  . Alcohol use: No  . Drug use: No  . Sexual activity: Not Currently  Lifestyle  . Physical activity    Days per week: 2 days    Minutes per session: 20 min  . Stress: Not at all  Relationships  . Social connections    Talks on phone: More than three times a week    Gets together: More than three times a week    Attends religious service: More than 4 times per year    Active member of club or organization: Yes    Attends meetings of clubs or organizations: 1 to 4 times per year    Relationship status: Widowed  Other Topics Concern  . Not on file  Social History Narrative   Ms Childers lives with her daughter and grandchildren.     Activities of Daily Living In your present state of health, do you have any difficulty performing the following activities: 04/22/2019 04/06/2019  Hearing? N N   Vision? N N  Difficulty concentrating or making decisions? N -  Walking or climbing stairs? N N  Dressing or bathing? N N  Doing errands, shopping? N N  Preparing Food and eating ? N -  Using the Toilet? N -  In the past six months, have you accidently leaked urine? N -  Do you have problems with loss of bowel control? N -  Managing your Medications? N -  Managing your Finances? N -  Housekeeping or managing your Housekeeping? N -  Some recent data might be hidden    Patient Education/ Literacy    Exercise  Diet Patient reports consuming 3 meals a day and {Numberssnack(s) a day Patient reports that her primary diet is: Regular Patient reports that she does have regular access to food.   Depression Screen PHQ 2/9 Scores 04/22/2019 04/06/2019 11/09/2018 06/16/2018 05/03/2018 12/30/2017 12/01/2017  PHQ - 2 Score 0 0 0 0 0 0 0  PHQ- 9 Score - - - - - - -     Fall Risk Fall Risk  04/22/2019 04/06/2019 11/09/2018 06/16/2018 05/03/2018  Falls in the past year? 0 0 0 No No  Number falls in past yr: - 0 - - -  Injury with Fall? - 0 - - -  Follow up - Falls prevention discussed - - -     Objective:  Colleen Duran seemed alert and oriented and she participated appropriately during our telephone visit.  Blood Pressure Weight BMI  BP Readings from Last 3 Encounters:  04/06/19 (!) 143/88  11/09/18 (!) 149/82  06/16/18 (!) 147/87   Wt Readings from Last 3 Encounters:  11/09/18 214 lb 9.6 oz (97.3 kg)  06/16/18 206 lb 6.4 oz (93.6 kg)  05/03/18 212 lb 3.2 oz (96.3 kg)   BMI Readings from Last 1 Encounters:  11/09/18 34.64 kg/m    *Unable to obtain current vital signs, weight, and BMI due to telephone visit type  Hearing/Vision  . Tamzin did not seem to have difficulty with hearing/understanding during the telephone conversation . Reports that she has had a formal eye exam by an eye care professional within the past year . Reports that she has not had a formal hearing evaluation within  the past year *Unable to fully assess hearing and vision during telephone visit type  Cognitive Function: 6CIT Screen 04/22/2019  What Year? 0 points  What month? 0 points  What time? 0 points  Count back from 20 2 points  Months in reverse 2 points  Repeat phrase 2 points  Total Score 6   (Normal:0-7, Significant for Dysfunction: >8)  Normal Cognitive Function Screening: Yes   Immunization & Health Maintenance Record Immunization History  Administered Date(s) Administered  . Influenza, High Dose Seasonal PF 07/21/2017, 06/16/2018  . Influenza-Unspecified 06/06/2014  . Pneumococcal Conjugate-13 12/02/2016  . Pneumococcal Polysaccharide-23 10/30/2017  . Tdap 10/30/2017  . Zoster Recombinat (Shingrix) 12/30/2017    Health Maintenance  Topic Date Due  . OPHTHALMOLOGY EXAM  05/19/2018  . INFLUENZA VACCINE  04/09/2019  . FOOT EXAM  05/04/2019  . HEMOGLOBIN A1C  05/12/2019  . MAMMOGRAM  10/13/2019  . COLONOSCOPY  05/10/2020  . TETANUS/TDAP  10/31/2027  . DEXA SCAN  Completed  . Hepatitis C Screening  Completed  . PNA vac Low Risk Adult  Completed       Assessment  This is a routine wellness examination for Colleen Duran.  Health Maintenance: Due or Overdue Health Maintenance Due  Topic Date Due  . OPHTHALMOLOGY EXAM  05/19/2018  . INFLUENZA VACCINE  04/09/2019    Colleen Duran does not need a referral for Community Assistance: Care Management:   no Social Work:    no Prescription Assistance:  no Nutrition/Diabetes Education:  no   Plan:  Personalized Goals Goals Addressed            This Visit's Progress   . "I want to keep my arthritis pain under control" (pt-stated)   On track    Current Barriers:  . Chronic Disease Management support and education needs related to arthritis  Nurse Case Manager Clinical  Goal(s):  [name] Over the next 30 days, patient will work with Consulting civil engineer to address needs related to arthritis.   Interventions:  . Advised patient  to continue taking extra strength Tylenol as needed for joint pain . Advised to report any new or worsening symptoms by calling RNCM at [phone] or PCP at [phone].  Patient Self Care Activities:  . Performs ADL's independently . Performs IADL's independently  Initial goal documentation     . "I would like to continue being independent and maintain or improve my level of health" (pt-stated)   On track    Current Barriers:  . None  Nurse Case Manager Clinical Goal(s):  [name] Over the next 30 days, patient will work with Consulting civil engineer to address needs related to self management of chronic medical conditions.  . Over the next 30 days, the patient will demonstrate ongoing self health care management ability as evidenced by maintaining her current level of independence.*  Interventions:  . Assessed current activity level: o Walks around the track in town 20 min to 1 hour 3 times a week. Typically walks early before it gets too hot . Encouraged continued physical activity . Advised patient to  avoid outdoor activities during the heat of the day and to limit any time spent outdoors until the heat and humidity have decreased.  . Discussed plans with patient for ongoing care management follow up and provided patient with direct contact information for care management team . Fall assessment completed . Falls prevention discussed  Patient Self Care Activities:  . Performs ADL's independently . Performs IADL's independently  Initial goal documentation     . Increase physical activity       Pt has not been able to walk around downtown [place] like she used to because of the virus. Encouraged pt to walk more around her neighborhood if she feels safe and she does.      Personalized Health Maintenance & Screening Recommendations  none  Lung Cancer Screening Recommended: no (Low Dose CT Chest recommended if Age 96-80 years, 30 pack-year currently smoking OR have quit w/in past 15 years)  Hepatitis C Screening recommended: no HIV Screening recommended: no  Advanced Directives: Pt will bring copy to office.  Referrals & Orders No orders of the defined types were placed in this encounter.   Follow-up Plan . Follow-up with [name], FNP as planned . Schedule  Pt will bring living will to office for [place] to scan. She is not due for eye exam, she had in 08/2018   I have personally reviewed and noted the following in the patient's chart:   . Medical and social history . Use of alcohol, tobacco or illicit drugs  . Current medications and supplements . Functional ability and status . Nutritional status . Physical activity . Advanced directives . List of other physicians . Hospitalizations, surgeries, and ER visits in previous 12 months . Vitals . Screenings to include cognitive, depression, and falls . Referrals and appointments  In addition, I have reviewed and discussed with Colleen Duran certain preventive protocols, quality metrics, and best practice recommendations. A written personalized care plan for preventive services as well as general preventive health recommendations is available and can be mailed to the patient at her request.      Rana Snare, LPN  [id number]/[id number]   I have reviewed and agree with the above AWV documentation.   Evelina Dun, FNP

## 2019-04-26 ENCOUNTER — Other Ambulatory Visit: Payer: Self-pay | Admitting: Family

## 2019-04-26 DIAGNOSIS — E1159 Type 2 diabetes mellitus with other circulatory complications: Secondary | ICD-10-CM

## 2019-05-05 ENCOUNTER — Ambulatory Visit (INDEPENDENT_AMBULATORY_CARE_PROVIDER_SITE_OTHER): Payer: Medicare HMO | Admitting: *Deleted

## 2019-05-05 DIAGNOSIS — I10 Essential (primary) hypertension: Secondary | ICD-10-CM

## 2019-05-05 DIAGNOSIS — E1169 Type 2 diabetes mellitus with other specified complication: Secondary | ICD-10-CM

## 2019-05-05 DIAGNOSIS — M150 Primary generalized (osteo)arthritis: Secondary | ICD-10-CM | POA: Diagnosis not present

## 2019-05-05 DIAGNOSIS — E1159 Type 2 diabetes mellitus with other circulatory complications: Secondary | ICD-10-CM | POA: Diagnosis not present

## 2019-05-05 DIAGNOSIS — M159 Polyosteoarthritis, unspecified: Secondary | ICD-10-CM

## 2019-05-05 NOTE — Chronic Care Management (AMB) (Addendum)
Chronic Care Management   Follow Up Note   05/05/2019 Name: Colleen Duran MRN: [id number] DOB: 12-Jan-1946  Referred by: [name], FNP Reason for referral : Chronic Care Management (RNCM Follow Up)   Colleen Duran is a 73 y.o. year old female who is a primary care patient of [name], FNP. The CCM team was consulted for assistance with chronic disease management and care coordination needs.    Review of patient status, including review of consultants reports, relevant laboratory and other test results, and collaboration with appropriate care team members and the patient's provider was performed as part of comprehensive patient evaluation and provision of chronic care management services.    SDOH (Social Determinants of Health) screening performed today: Physical Activity. See Care Plan for related entries.   Subjective: I spoke with Ms Colleen Duran by telephone today. We discussed management of her chronic medical conditions and she was particularly interested in managing her diabetes and hypertension a little better. She has a home blood pressure monitor and blood glucose monitor and is familiar with how to use it. She continues to enjoy walks around the park several times a week and typically goes early in the morning or late in the evening. She is also doing her own grocery shopping and running errands but is being careful to avoid crowds and is wearing a mask to help protect herself against Covid-19.  Objective: Outpatient Encounter Medications as of 05/05/2019  Medication Sig Note  . acetaminophen (TYLENOL) 500 MG tablet Take 500 mg by mouth every 6 (six) hours as needed for mild pain.    [name] amLODipine (NORVASC) 10 MG tablet TAKE ONE TABLET BY MOUTH DAILY.   [name] aspirin EC 81 MG tablet Take 1 tablet (81 mg total) by mouth daily.   [name] atorvastatin (LIPITOR) 20 MG tablet Take 1 tablet (20 mg total) by mouth daily.   . calcium-vitamin D (OSCAL WITH D) 500-200 MG-UNIT tablet Take 1 tablet  by mouth. 10/30/2017: Taking liquid calcium 1000 mg and Vitamin D3 25u in place of pill.  [name] losartan-hydrochlorothiazide (HYZAAR) 100-12.5 MG tablet TAKE ONE TABLET BY MOUTH DAILY.   . metFORMIN (GLUCOPHAGE) 500 MG tablet TAKE ONE TABLET BY MOUTH TWICE DAILY. TAKE WITH A MEAL.   . Multiple Vitamin (MULTIVITAMIN WITH MINERALS) TABS tablet Take 1 tablet by mouth daily.    No facility-administered encounter medications on file as of 05/05/2019.     Lab Results  Component Value Date   HGBA1C 6.3 11/09/2018   HGBA1C 6.1 05/03/2018   HGBA1C 6.0 10/30/2017   Lab Results  Component Value Date   LDLCALC 106 (H) 11/09/2018   CREATININE 0.80 11/09/2018    Assessment:  Goals Addressed            This Visit's Progress     Patient Stated   . "I want to keep my arthritis pain under control" (pt-stated)   On track    Current Barriers:  . Chronic Disease Management support and education needs related to arthritis  Nurse Case Manager Clinical Goal(s):  [name] Over the next 90 days, the patient will demonstrate ongoing self health care management ability as evidenced by taking medication as prescribed to keep pain at a manageable level.*  Interventions:  . Advised patient to continue taking extra strength Tylenol as needed for joint pain . Advised to report any new or worsening symptoms by calling RNCM at [phone] or PCP at [phone].  Patient Self Care Activities:  . Performs  ADL's independently . Performs IADL's independently  Please see past updates related to this goal by clicking on the "Past Updates" button in the selected goal      . "I want to keep my blood sugar under control" (pt-stated)       Current Barriers:  . Chronic Disease Management support and education needs related to diabetes  Nurse Case Manager Clinical Goal(s):  [name] Over the next 30 days, patient will work with [facility] to address needs related to diabetes  Interventions:  . Evaluation of current treatment plan  related to diabetes and patient's adherence to plan as established by provider. . Provided education to patient re: diet and blood sugar. Recommended to watch portion sizes and to cut back on simple carbs.  . Discussed plans with patient for ongoing care management follow up and provided patient with direct contact information for care management team . Advised patient, providing education and rationale, to check cbg daily and record, calling [phone] or [phone] for findings outside established parameters.   . Discussed Metformin and how it works. Explained that it will not cause low blood sugar.  . Discussed recent CBG readings o Fasting this morning was 168 o Patient feels symptomatic of hypoglycemia if sugar is lower than 140 - Will eat a snack to bring sugar back up . Discussed normal blood sugar range and that although 140 is high, she feels like it's low because she's used to her blood sugar being elevated . Will collaborate with PCP regarding diabetes management o Should she increase metformin to 1000mg  BID?  Patient Self Care Activities:  . Performs ADL's independently . Performs IADL's independently  Initial goal documentation     . "I want to make sure my blood pressure is ok" (pt-stated)       Current Barriers:  . Chronic Disease Management support and education needs related to hypertension  Nurse Case Manager Clinical Goal(s):  [name] Over the next 30 days, patient will work with [facility] to address needs related to self management of hypertension  Interventions:  . Advised patient to continue checking blood pressure daily and as needed.  . Provided education to patient re: effects of diet on blood pressure . Discussed plans with patient for ongoing care management follow up and provided patient with direct contact information for care management team . Discussed timing of BP checks.  o Often checks in the mornings before meds and BP is up. Sometimes checks in the  afternoon and the readings vary  o BP this morning 148/98 before meds . Verified medications. Taking amlodipine and hyzaar in the morning.  . Asked patient to check and record blood pressure 1 hr after taking HTN meds. Spot check and record time and reading a few times this during the week.  . Will talk with patient next week regarding readings and will discuss with PCP . Patient will call [phone] or [phone] to report any readings above provider recommended range  Patient Self Care Activities:  . Performs ADL's independently . Performs IADL's independently  Initial goal documentation     . "I would like to continue being independent and maintain or improve my level of health" (pt-stated)   On track    Current Barriers:  . None  Nurse Case Manager Clinical Goal(s):  [name] Over the next 30 days, the patient will demonstrate ongoing self health care management ability as evidenced by maintaining her current level of independence.*  Interventions:  . Assessed current activity level: o [employer]  around the track in town 20 min to 1 hour 3 times a week. Typically walks early before it gets too hot . Again encouraged continued physical activity . Reminded patient to  avoid outdoor activities during the heat of the day and to limit any time spent outdoors until the heat and humidity have decreased.  . Discussed plans with patient for ongoing care management follow up and provided patient with direct contact information for care management team  Patient Self Care Activities:  . Performs ADL's independently . Performs IADL's independently  Please see past updates related to this goal by clicking on the "Past Updates" button in the selected goal         Follow up Plan  PCP visit scheduled for 05/09/19  Telephone follow up appointment with care management team member scheduled for:05/13/2019  Chong Sicilian BSN, RN-BC [facility] /  [facility] Management Direct Dial: [phone]    I have reviewed and agree with the above  documentation.   Evelina Dun, FNP

## 2019-05-05 NOTE — Patient Instructions (Signed)
Visit Information  Goals Addressed            This Visit's Progress     Patient Stated   . "I want to keep my arthritis pain under control" (pt-stated)   On track    Current Barriers:  . Chronic Disease Management support and education needs related to arthritis  Nurse Case Manager Clinical Goal(s):  [name] Over the next 90 days, the patient will demonstrate ongoing self health care management ability as evidenced by taking medication as prescribed to keep pain at a manageable level.*  Interventions:  . Advised patient to continue taking extra strength Tylenol as needed for joint pain . Advised to report any new or worsening symptoms by calling RNCM at [phone] or PCP at [phone].  Patient Self Care Activities:  . Performs ADL's independently . Performs IADL's independently  Please see past updates related to this goal by clicking on the "Past Updates" button in the selected goal      . "I want to keep my blood sugar under control" (pt-stated)       Current Barriers:  . Chronic Disease Management support and education needs related to diabetes  Nurse Case Manager Clinical Goal(s):  [name] Over the next 30 days, patient will work with [facility] to address needs related to diabetes  Interventions:  . Evaluation of current treatment plan related to diabetes and patient's adherence to plan as established by provider. . Provided education to patient re: diet and blood sugar. Recommended to watch portion sizes and to cut back on simple carbs.  . Discussed plans with patient for ongoing care management follow up and provided patient with direct contact information for care management team . Advised patient, providing education and rationale, to check cbg daily and record, calling [phone] or [phone] for findings outside established parameters.   . Discussed Metformin and how it works. Explained that it will not cause low blood sugar.  . Discussed recent CBG readings o Fasting  this morning was 168 o Patient feels symptomatic of hypoglycemia if sugar is lower than 140 - Will eat a snack to bring sugar back up . Discussed normal blood sugar range and that although 140 is high, she feels like it's low because she's used to her blood sugar being elevated . Will collaborate with PCP regarding diabetes management o Should she increase metformin to 1000mg  BID?  Patient Self Care Activities:  . Performs ADL's independently . Performs IADL's independently  Initial goal documentation     . "I want to make sure my blood pressure is ok" (pt-stated)       Current Barriers:  . Chronic Disease Management support and education needs related to hypertension  Nurse Case Manager Clinical Goal(s):  [name] Over the next 30 days, patient will work with [facility] to address needs related to self management of hypertension  Interventions:  . Advised patient to continue checking blood pressure daily and as needed.  . Provided education to patient re: effects of diet on blood pressure . Discussed plans with patient for ongoing care management follow up and provided patient with direct contact information for care management team . Discussed timing of BP checks.  o Often checks in the mornings before meds and BP is up. Sometimes checks in the afternoon and the readings vary  o BP this morning 148/98 before meds . Verified medications. Taking amlodipine and hyzaar in the morning.  . Asked patient to check and record blood pressure 1 hr after taking HTN  meds. Spot check and record time and reading a few times this during the week.  . Will talk with patient next week regarding readings and will discuss with PCP . Patient will call [phone] or [phone] to report any readings above provider recommended range  Patient Self Care Activities:  . Performs ADL's independently . Performs IADL's independently  Initial goal documentation     . "I would like to continue being independent and  maintain or improve my level of health" (pt-stated)   On track    Current Barriers:  . None  Nurse Case Manager Clinical Goal(s):  [name] Over the next 30 days, the patient will demonstrate ongoing self health care management ability as evidenced by maintaining her current level of independence.*  Interventions:  . Assessed current activity level: o Walks around the track in town 20 min to 1 hour 3 times a week. Typically walks early before it gets too hot . Again encouraged continued physical activity . Reminded patient to  avoid outdoor activities during the heat of the day and to limit any time spent outdoors until the heat and humidity have decreased.  . Discussed plans with patient for ongoing care management follow up and provided patient with direct contact information for care management team  Patient Self Care Activities:  . Performs ADL's independently . Performs IADL's independently  Please see past updates related to this goal by clicking on the "Past Updates" button in the selected goal         The patient verbalized understanding of instructions provided today and declined a print copy of patient instruction materials.   Follow up Plan  PCP visit scheduled for 05/09/19  Telephone follow up appointment with care management team member scheduled for:05/13/2019  Chong Sicilian BSN, RN-BC [place] [facility] [place] Management Direct Dial: [phone]

## 2019-05-09 ENCOUNTER — Ambulatory Visit: Payer: Medicare HMO | Admitting: Family

## 2019-05-13 ENCOUNTER — Ambulatory Visit: Payer: Medicare HMO | Admitting: *Deleted

## 2019-05-13 DIAGNOSIS — E1169 Type 2 diabetes mellitus with other specified complication: Secondary | ICD-10-CM

## 2019-05-13 DIAGNOSIS — I152 Hypertension secondary to endocrine disorders: Secondary | ICD-10-CM

## 2019-05-13 DIAGNOSIS — E1159 Type 2 diabetes mellitus with other circulatory complications: Secondary | ICD-10-CM

## 2019-05-13 NOTE — Chronic Care Management (AMB) (Addendum)
  Chronic Care Management   Follow Up Note   05/13/2019 Name: Colleen Duran MRN: [id number] DOB: February 21, 1946  Referred by: Colleen Balloon, FNP Reason for referral : Chronic Care Management (RNCM Follow Up)   Colleen Duran is a 73 y.o. year old female who is a primary care patient of Colleen Balloon, FNP. The CCM team was consulted for assistance with chronic disease management and care coordination needs.    Review of patient status, including review of consultants reports, relevant laboratory and other test results, and collaboration with appropriate care team members and the patient's provider was performed as part of comprehensive patient evaluation and provision of chronic care management services.    I spoke with Colleen Duran by telephone today. She was scheduled for a visit with her PCP last week but it was moved to 05/30/2019.    Outpatient Encounter Medications as of 05/13/2019  Medication Sig Note  . acetaminophen (TYLENOL) 500 MG tablet Take 500 mg by mouth every 6 (six) hours as needed for mild pain.    [name] amLODipine (NORVASC) 10 MG tablet TAKE ONE TABLET BY MOUTH DAILY.   [name] aspirin EC 81 MG tablet Take 1 tablet (81 mg total) by mouth daily.   [name] atorvastatin (LIPITOR) 20 MG tablet Take 1 tablet (20 mg total) by mouth daily.   . calcium-vitamin D (OSCAL WITH D) 500-200 MG-UNIT tablet Take 1 tablet by mouth. 10/30/2017: Taking liquid calcium 1000 mg and Vitamin D3 25u in place of pill.  [name] losartan-hydrochlorothiazide (HYZAAR) 100-12.5 MG tablet TAKE ONE TABLET BY MOUTH DAILY.   . metFORMIN (GLUCOPHAGE) 500 MG tablet TAKE ONE TABLET BY MOUTH TWICE DAILY. TAKE WITH A MEAL.   . Multiple Vitamin (MULTIVITAMIN WITH MINERALS) TABS tablet Take 1 tablet by mouth daily.    No facility-administered encounter medications on file as of 05/13/2019.      Goals Addressed            This Visit's Progress     Patient Stated   . "I want to keep my blood sugar under control" (pt-stated)       Current  Barriers:  . Chronic Disease Management support and education needs related to diabetes  Nurse Case Manager Clinical Goal(s):  [name] Over the next 30 days, patient will work with [facility] to address needs related to diabetes  Interventions:  . General discussion regarding current health status . Chart reviewed o Appointment with PCP was moved to 05/30/2019 . RNCM will connect with PCP prior to that appointment to discuss potential improvement in blood sugar results with increased metformin dose . Patient advised to continue current dose of medications until then . Patient provided with CCM contact information . Advised to call with any nursing related questions or concerns  Patient Self Care Activities:  . Performs ADL's independently . Performs IADL's independently  Please see past updates related to this goal by clicking on the "Past Updates" button in the selected goal         Plan RNCM will consult with PCP regarding diabetes medication RNCM will f/u with patient by telephone within 1 week after her visit with PCP 05/30/2019   Colleen Duran BSN, RN-BC [place] [facility] [place] Management Direct Dial: [phone]    I have reviewed and agree with the above  documentation.   Colleen Dun, FNP

## 2019-05-13 NOTE — Patient Instructions (Signed)
Visit Information  Goals Addressed            This Visit's Progress     Patient Stated   . "I want to keep my blood sugar under control" (pt-stated)       Current Barriers:  . Chronic Disease Management support and education needs related to diabetes  Nurse Case Manager Clinical Goal(s):  [name] Over the next 30 days, patient will work with [facility] to address needs related to diabetes  Interventions:  . General discussion regarding current health status . Chart reviewed o Appointment with PCP was moved to 05/30/2019 . RNCM will connect with PCP prior to that appointment to discuss potential improvement in blood sugar results with increased metformin dose . Patient advised to continue current dose of medications until then . Patient provided with CCM contact information . Advised to call with any nursing related questions or concerns  Patient Self Care Activities:  . Performs ADL's independently . Performs IADL's independently  Please see past updates related to this goal by clicking on the "Past Updates" button in the selected goal         The patient verbalized understanding of instructions provided today and declined a print copy of patient instruction materials.    Plan RNCM will consult with PCP regarding diabetes medication RNCM will f/u with patient by telephone within 1 week after her visit with PCP 05/30/2019   Chong Sicilian BSN, RN-BC [place] [facility] [place] Management Direct Dial: [phone]

## 2019-05-30 ENCOUNTER — Ambulatory Visit (INDEPENDENT_AMBULATORY_CARE_PROVIDER_SITE_OTHER): Payer: Medicare HMO | Admitting: Family

## 2019-05-30 ENCOUNTER — Encounter: Payer: Self-pay | Admitting: Family

## 2019-05-30 ENCOUNTER — Other Ambulatory Visit: Payer: Self-pay

## 2019-05-30 VITALS — BP 163/93 | HR 89 | Temp 97.1°F | Resp 20 | Ht 66.0 in | Wt 214.0 lb

## 2019-05-30 DIAGNOSIS — Z23 Encounter for immunization: Secondary | ICD-10-CM | POA: Diagnosis not present

## 2019-05-30 DIAGNOSIS — I10 Essential (primary) hypertension: Secondary | ICD-10-CM

## 2019-05-30 DIAGNOSIS — M159 Polyosteoarthritis, unspecified: Secondary | ICD-10-CM

## 2019-05-30 DIAGNOSIS — E669 Obesity, unspecified: Secondary | ICD-10-CM | POA: Diagnosis not present

## 2019-05-30 DIAGNOSIS — E1159 Type 2 diabetes mellitus with other circulatory complications: Secondary | ICD-10-CM

## 2019-05-30 DIAGNOSIS — M150 Primary generalized (osteo)arthritis: Secondary | ICD-10-CM | POA: Diagnosis not present

## 2019-05-30 DIAGNOSIS — E1169 Type 2 diabetes mellitus with other specified complication: Secondary | ICD-10-CM

## 2019-05-30 DIAGNOSIS — E785 Hyperlipidemia, unspecified: Secondary | ICD-10-CM | POA: Diagnosis not present

## 2019-05-30 LAB — BAYER DCA HB A1C WAIVED: HB A1C (BAYER DCA - WAIVED): 6.2 % (ref <7.0)

## 2019-05-30 NOTE — Patient Instructions (Signed)
Health Maintenance After Age 75 After age 75, you are at a higher risk for certain long-term diseases and infections as well as injuries from falls. Falls are a major cause of broken bones and head injuries in people who are older than age 75. Getting regular preventive care can help to keep you healthy and well. Preventive care includes getting regular testing and making lifestyle changes as recommended by your health care provider. Talk with your health care provider about:  Which screenings and tests you should have. A screening is a test that checks for a disease when you have no symptoms.  A diet and exercise plan that is right for you. What should I know about screenings and tests to prevent falls? Screening and testing are the best ways to find a health problem early. Early diagnosis and treatment give you the best chance of managing medical conditions that are common after age 75. Certain conditions and lifestyle choices may make you more likely to have a fall. Your health care provider may recommend:  Regular vision checks. Poor vision and conditions such as cataracts can make you more likely to have a fall. If you wear glasses, make sure to get your prescription updated if your vision changes.  Medicine review. Work with your health care provider to regularly review all of the medicines you are taking, including over-the-counter medicines. Ask your health care provider about any side effects that may make you more likely to have a fall. Tell your health care provider if any medicines that you take make you feel dizzy or sleepy.  Osteoporosis screening. Osteoporosis is a condition that causes the bones to get weaker. This can make the bones weak and cause them to break more easily.  Blood pressure screening. Blood pressure changes and medicines to control blood pressure can make you feel dizzy.  Strength and balance checks. Your health care provider may recommend certain tests to check your  strength and balance while standing, walking, or changing positions.  Foot health exam. Foot pain and numbness, as well as not wearing proper footwear, can make you more likely to have a fall.  Depression screening. You may be more likely to have a fall if you have a fear of falling, feel emotionally low, or feel unable to do activities that you used to do.  Alcohol use screening. Using too much alcohol can affect your balance and may make you more likely to have a fall. What actions can I take to lower my risk of falls? General instructions  Talk with your health care provider about your risks for falling. Tell your health care provider if: ? You fall. Be sure to tell your health care provider about all falls, even ones that seem minor. ? You feel dizzy, sleepy, or off-balance.  Take over-the-counter and prescription medicines only as told by your health care provider. These include any supplements.  Eat a healthy diet and maintain a healthy weight. A healthy diet includes low-fat dairy products, low-fat (lean) meats, and fiber from whole grains, beans, and lots of fruits and vegetables. Home safety  Remove any tripping hazards, such as rugs, cords, and clutter.  Install safety equipment such as grab bars in bathrooms and safety rails on stairs.  Keep rooms and walkways well-lit. Activity   Follow a regular exercise program to stay fit. This will help you maintain your balance. Ask your health care provider what types of exercise are appropriate for you.  If you need a cane or   walker, use it as recommended by your health care provider.  Wear supportive shoes that have nonskid soles. Lifestyle  Do not drink alcohol if your health care provider tells you not to drink.  If you drink alcohol, limit how much you have: ? 0-1 drink a day for women. ? 0-2 drinks a day for men.  Be aware of how much alcohol is in your drink. In the [place] one drink equals one typical bottle of beer (12  oz), one-half glass of wine (5 oz), or one shot of hard liquor (1 oz).  Do not use any products that contain nicotine or tobacco, such as cigarettes and e-cigarettes. If you need help quitting, ask your health care provider. Summary  Having a healthy lifestyle and getting preventive care can help to protect your health and wellness after age 75.  Screening and testing are the best way to find a health problem early and help you avoid having a fall. Early diagnosis and treatment give you the best chance for managing medical conditions that are more common for people who are older than age 75.  Falls are a major cause of broken bones and head injuries in people who are older than age 75. Take precautions to prevent a fall at home.  Work with your health care provider to learn what changes you can make to improve your health and wellness and to prevent falls. This information is not intended to replace advice given to you by your health care provider. Make sure you discuss any questions you have with your health care provider. Document Released: 07/08/2017 Document Revised: 12/16/2018 Document Reviewed: 07/08/2017 Elsevier Patient Education  2020 Elsevier Inc.  

## 2019-05-30 NOTE — Progress Notes (Signed)
Subjective:    Patient ID: Colleen Duran, female    DOB: [birth year], 73 y.o.   MRN: [id number]  Chief Complaint  Patient presents with  . Medical Management of Chronic Issues    six month recheck   Pt presents to the office today for chronic follow up. Diabetes She presents for her follow-up diabetic visit. She has type 2 diabetes mellitus. Her disease course has been stable. There are no hypoglycemic associated symptoms. Pertinent negatives for diabetes include no blurred vision, no foot paresthesias and no visual change. Symptoms are stable. Pertinent negatives for diabetic complications include no CVA, heart disease, nephropathy or peripheral neuropathy. Risk factors for coronary artery disease include dyslipidemia, diabetes mellitus, hypertension, sedentary lifestyle and post-menopausal. She is following a generally unhealthy diet. Her overall blood glucose range is 140-180 mg/dl. Eye exam is current.  Hyperlipidemia This is a chronic problem. The current episode started more than 1 year ago. Recent lipid tests were reviewed and are high. Exacerbating diseases include obesity. Pertinent negatives include no shortness of breath. Current antihyperlipidemic treatment includes statins. The current treatment provides moderate improvement of lipids. Risk factors for coronary artery disease include diabetes mellitus, hypertension, a sedentary lifestyle, post-menopausal and dyslipidemia.  Hypertension This is a chronic problem. The current episode started more than 1 year ago. The problem has been waxing and waning since onset. The problem is uncontrolled. Associated symptoms include malaise/fatigue and peripheral edema. Pertinent negatives include no blurred vision or shortness of breath. Risk factors for coronary artery disease include dyslipidemia, obesity and sedentary lifestyle. The current treatment provides moderate improvement. There is no history of kidney disease, CAD/MI, CVA or heart failure.   Arthritis Presents for follow-up visit. She complains of pain and stiffness. The symptoms have been stable. Affected locations include the right knee and left knee. Her pain is at a severity of 5/10.      Review of Systems  Constitutional: Positive for malaise/fatigue.  Eyes: Negative for blurred vision.  Respiratory: Negative for shortness of breath.   Musculoskeletal: Positive for arthritis and stiffness.  All other systems reviewed and are negative.      Objective:   Physical Exam Vitals signs reviewed.  Constitutional:      General: She is not in acute distress.    Appearance: She is well-developed.  HENT:     Head: Normocephalic and atraumatic.     Right Ear: Tympanic membrane normal.     Left Ear: Tympanic membrane normal.  Eyes:     Pupils: Pupils are equal, round, and reactive to light.  Neck:     Musculoskeletal: Normal range of motion and neck supple.     Thyroid: No thyromegaly.  Cardiovascular:     Rate and Rhythm: Normal rate and regular rhythm.     Heart sounds: Normal heart sounds. No murmur.  Pulmonary:     Effort: Pulmonary effort is normal. No respiratory distress.     Breath sounds: Normal breath sounds. No wheezing.  Abdominal:     General: Bowel sounds are normal. There is no distension.     Palpations: Abdomen is soft.     Tenderness: There is no abdominal tenderness.  Musculoskeletal: Normal range of motion.        General: No tenderness.     Right lower leg: Edema (trace) present.     Left lower leg: Edema (trace) present.  Skin:    General: Skin is warm and dry.  Neurological:     Mental Status: She  is alert and oriented to person, place, and time.     Cranial Nerves: No cranial nerve deficit.     Deep Tendon Reflexes: Reflexes are normal and symmetric.  Psychiatric:        Behavior: Behavior normal.        Thought Content: Thought content normal.        Judgment: Judgment normal.       BP (!) 163/93   Pulse 89   Temp (!) 97.1 F  (36.2 C) (Temporal)   Resp 20   Ht '5\' 6"'$  (1.676 m)   Wt 214 lb (97.1 kg)   SpO2 100%   BMI 34.54 kg/m      Assessment & Plan:  Colleen Duran comes in today with chief complaint of Medical Management of Chronic Issues (six month recheck)   Diagnosis and orders addressed:  1. Hypertension associated with diabetes ([place]) - CMP14+EGFR  2. Hyperlipidemia associated with type 2 diabetes mellitus ([place]) - CMP14+EGFR  3. Type 2 diabetes mellitus with other specified complication, without long-term current use of insulin ([facility]) - Bayer DCA Hb A1c Waived - CMP14+EGFR - Microalbumin / creatinine urine ratio  4. Primary osteoarthritis involving multiple joints - CMP14+EGFR  5. Obesity (BMI 30-39.9) - CMP14+EGFR   Labs pending Health Maintenance reviewed Diet and exercise encouraged  Follow up plan: 6 months    Evelina Dun, FNP

## 2019-05-31 LAB — CMP14+EGFR
ALT: 13 IU/L (ref 0–32)
AST: 16 IU/L (ref 0–40)
Albumin/Globulin Ratio: 1.6 (ref 1.2–2.2)
Albumin: 4.6 g/dL (ref 3.7–4.7)
Alkaline Phosphatase: 92 IU/L (ref 39–117)
BUN/Creatinine Ratio: 15 (ref 12–28)
BUN: 13 mg/dL (ref 8–27)
Bilirubin Total: 0.5 mg/dL (ref 0.0–1.2)
CO2: 27 mmol/L (ref 20–29)
Calcium: 9.7 mg/dL (ref 8.7–10.3)
Chloride: 102 mmol/L (ref 96–106)
Creatinine, Ser: 0.86 mg/dL (ref 0.57–1.00)
GFR calc Af Amer: 77 mL/min/{1.73_m2} (ref >59)
GFR calc non Af Amer: 67 mL/min/{1.73_m2} (ref >59)
Globulin, Total: 2.9 g/dL (ref 1.5–4.5)
Glucose: 120 mg/dL — ABNORMAL HIGH (ref 65–99)
Potassium: 3.9 mmol/L (ref 3.5–5.2)
Sodium: 141 mmol/L (ref 134–144)
Total Protein: 7.5 g/dL (ref 6.0–8.5)

## 2019-05-31 LAB — MICROALBUMIN / CREATININE URINE RATIO
Creatinine, Urine: 22.5 mg/dL
Microalb/Creat Ratio: 114 mg/g creat — ABNORMAL HIGH (ref 0–29)
Microalbumin, Urine: 25.7 ug/mL

## 2019-07-05 DIAGNOSIS — Z1231 Encounter for screening mammogram for malignant neoplasm of breast: Secondary | ICD-10-CM | POA: Diagnosis not present

## 2019-07-14 ENCOUNTER — Other Ambulatory Visit: Payer: Self-pay | Admitting: Family

## 2019-07-14 DIAGNOSIS — E1159 Type 2 diabetes mellitus with other circulatory complications: Secondary | ICD-10-CM

## 2019-07-18 ENCOUNTER — Other Ambulatory Visit: Payer: Self-pay | Admitting: Family

## 2019-07-18 DIAGNOSIS — I152 Hypertension secondary to endocrine disorders: Secondary | ICD-10-CM

## 2019-07-18 DIAGNOSIS — E119 Type 2 diabetes mellitus without complications: Secondary | ICD-10-CM

## 2019-07-18 DIAGNOSIS — E1159 Type 2 diabetes mellitus with other circulatory complications: Secondary | ICD-10-CM

## 2019-07-20 ENCOUNTER — Telehealth: Payer: Self-pay | Admitting: Family

## 2019-07-20 NOTE — Telephone Encounter (Signed)
Patient aware she is due

## 2019-07-25 ENCOUNTER — Other Ambulatory Visit: Payer: Self-pay | Admitting: Family

## 2019-08-30 ENCOUNTER — Ambulatory Visit (INDEPENDENT_AMBULATORY_CARE_PROVIDER_SITE_OTHER): Payer: Medicare HMO | Admitting: Family

## 2019-08-30 ENCOUNTER — Encounter: Payer: Self-pay | Admitting: Family

## 2019-08-30 DIAGNOSIS — E1169 Type 2 diabetes mellitus with other specified complication: Secondary | ICD-10-CM

## 2019-08-30 DIAGNOSIS — E669 Obesity, unspecified: Secondary | ICD-10-CM | POA: Diagnosis not present

## 2019-08-30 DIAGNOSIS — M159 Polyosteoarthritis, unspecified: Secondary | ICD-10-CM

## 2019-08-30 DIAGNOSIS — I10 Essential (primary) hypertension: Secondary | ICD-10-CM

## 2019-08-30 DIAGNOSIS — E1159 Type 2 diabetes mellitus with other circulatory complications: Secondary | ICD-10-CM

## 2019-08-30 DIAGNOSIS — E785 Hyperlipidemia, unspecified: Secondary | ICD-10-CM

## 2019-08-30 DIAGNOSIS — M8949 Other hypertrophic osteoarthropathy, multiple sites: Secondary | ICD-10-CM | POA: Diagnosis not present

## 2019-08-30 NOTE — Progress Notes (Signed)
Virtual Visit via telephone Note Due to COVID-19 pandemic this visit was conducted virtually. This visit type was conducted due to national recommendations for restrictions regarding the COVID-19 Pandemic (e.g. social distancing, sheltering in place) in an effort to limit this patient's exposure and mitigate transmission in our community. All issues noted in this document were discussed and addressed.  A physical exam was not performed with this format.  I connected with Colleen Duran on 08/30/19 at 1:40pm by telephone and verified that I am speaking with the correct person using two identifiers. Colleen Duran is currently located at home and no one is currently with her during visit. The provider, Evelina Dun, FNP is located in their office at time of visit.  I discussed the limitations, risks, security and privacy concerns of performing an evaluation and management service by telephone and the availability of in person appointments. I also discussed with the patient that there may be a patient responsible charge related to this service. The patient expressed understanding and agreed to proceed.   History and Present Illness:  Diabetes She presents for her follow-up diabetic visit. She has type 2 diabetes mellitus. Her disease course has been stable. There are no hypoglycemic associated symptoms. Pertinent negatives for diabetes include no blurred vision and no foot paresthesias. There are no hypoglycemic complications. Symptoms are stable. There are no diabetic complications. Risk factors for coronary artery disease include dyslipidemia, hypertension, sedentary lifestyle and post-menopausal. She is following a generally healthy diet. Her overall blood glucose range is 140-180 mg/dl. Eye exam is current.  Hypertension This is a chronic problem. The current episode started more than 1 year ago. The problem has been resolved since onset. The problem is controlled. Pertinent negatives include no blurred  vision, malaise/fatigue, peripheral edema or shortness of breath. Risk factors for coronary artery disease include dyslipidemia, obesity and sedentary lifestyle. The current treatment provides moderate improvement. There is no history of kidney disease, CAD/MI or heart failure.  Hyperlipidemia This is a chronic problem. The current episode started more than 1 year ago. The problem is controlled. Recent lipid tests were reviewed and are normal. Exacerbating diseases include obesity. Pertinent negatives include no shortness of breath. Current antihyperlipidemic treatment includes statins. The current treatment provides moderate improvement of lipids. Risk factors for coronary artery disease include dyslipidemia, diabetes mellitus, hypertension and a sedentary lifestyle.  Arthritis Presents for follow-up visit. She complains of pain. The symptoms have been stable. Affected locations include the right knee and left knee. Her pain is at a severity of 5/10.      Review of Systems  Constitutional: Negative for malaise/fatigue.  Eyes: Negative for blurred vision.  Respiratory: Negative for shortness of breath.   Musculoskeletal: Positive for arthritis.  All other systems reviewed and are negative.    Observations/Objective: No SOB or distress noted   Assessment and Plan: Colleen Duran comes in today with chief complaint of No chief complaint on file.   Diagnosis and orders addressed:  1. Hypertension associated with diabetes ([facility])  2. Type 2 diabetes mellitus with other specified complication, without long-term current use of insulin ([place])  3. Hyperlipidemia associated with type 2 diabetes mellitus ([place])  4. Primary osteoarthritis involving multiple joints  5. Obesity (BMI 30-39.9)   Labs reviewed  Health Maintenance reviewed Diet and exercise encouraged  Follow up plan:   3 months      I discussed the assessment and treatment plan with the patient. The patient was provided an  opportunity to ask questions and all were answered. The patient agreed with the plan and demonstrated an understanding of the instructions.   The patient was advised to call back or seek an in-person evaluation if the symptoms worsen or if the condition fails to improve as anticipated.  The above assessment and management plan was discussed with the patient. The patient verbalized understanding of and has agreed to the management plan. Patient is aware to call the clinic if symptoms persist or worsen. Patient is aware when to return to the clinic for a follow-up visit. Patient educated on when it is appropriate to go to the emergency department.   Time call ended:  1:57 pm  I provided 17 minutes of non-face-to-face time during this encounter.    Evelina Dun, FNP

## 2019-09-30 ENCOUNTER — Other Ambulatory Visit: Payer: Self-pay | Admitting: Family

## 2019-09-30 DIAGNOSIS — E1159 Type 2 diabetes mellitus with other circulatory complications: Secondary | ICD-10-CM

## 2019-09-30 DIAGNOSIS — I152 Hypertension secondary to endocrine disorders: Secondary | ICD-10-CM

## 2019-10-10 DIAGNOSIS — Z114 Encounter for screening for human immunodeficiency virus [HIV]: Secondary | ICD-10-CM | POA: Diagnosis not present

## 2019-10-10 DIAGNOSIS — M129 Arthropathy, unspecified: Secondary | ICD-10-CM | POA: Diagnosis not present

## 2019-10-10 DIAGNOSIS — G8929 Other chronic pain: Secondary | ICD-10-CM | POA: Diagnosis not present

## 2019-10-10 DIAGNOSIS — N951 Menopausal and female climacteric states: Secondary | ICD-10-CM | POA: Diagnosis not present

## 2019-10-10 DIAGNOSIS — R0602 Shortness of breath: Secondary | ICD-10-CM | POA: Diagnosis not present

## 2019-10-10 DIAGNOSIS — Z0000 Encounter for general adult medical examination without abnormal findings: Secondary | ICD-10-CM | POA: Diagnosis not present

## 2019-10-10 DIAGNOSIS — Z1339 Encounter for screening examination for other mental health and behavioral disorders: Secondary | ICD-10-CM | POA: Diagnosis not present

## 2019-10-10 DIAGNOSIS — D539 Nutritional anemia, unspecified: Secondary | ICD-10-CM | POA: Diagnosis not present

## 2019-10-10 DIAGNOSIS — M545 Low back pain: Secondary | ICD-10-CM | POA: Diagnosis not present

## 2019-10-10 DIAGNOSIS — E559 Vitamin D deficiency, unspecified: Secondary | ICD-10-CM | POA: Diagnosis not present

## 2019-10-10 DIAGNOSIS — E1165 Type 2 diabetes mellitus with hyperglycemia: Secondary | ICD-10-CM | POA: Diagnosis not present

## 2019-10-10 DIAGNOSIS — R5383 Other fatigue: Secondary | ICD-10-CM | POA: Diagnosis not present

## 2019-10-10 DIAGNOSIS — Z1159 Encounter for screening for other viral diseases: Secondary | ICD-10-CM | POA: Diagnosis not present

## 2019-10-10 DIAGNOSIS — Z79899 Other long term (current) drug therapy: Secondary | ICD-10-CM | POA: Diagnosis not present

## 2019-10-10 DIAGNOSIS — Z1331 Encounter for screening for depression: Secondary | ICD-10-CM | POA: Diagnosis not present

## 2019-10-14 DIAGNOSIS — R42 Dizziness and giddiness: Secondary | ICD-10-CM | POA: Diagnosis not present

## 2019-10-14 DIAGNOSIS — R600 Localized edema: Secondary | ICD-10-CM | POA: Diagnosis not present

## 2019-10-26 DIAGNOSIS — I10 Essential (primary) hypertension: Secondary | ICD-10-CM | POA: Diagnosis not present

## 2019-10-26 DIAGNOSIS — E7800 Pure hypercholesterolemia, unspecified: Secondary | ICD-10-CM | POA: Diagnosis not present

## 2019-10-26 DIAGNOSIS — M545 Low back pain: Secondary | ICD-10-CM | POA: Diagnosis not present

## 2019-10-26 DIAGNOSIS — E119 Type 2 diabetes mellitus without complications: Secondary | ICD-10-CM | POA: Diagnosis not present

## 2019-11-07 DIAGNOSIS — Z961 Presence of intraocular lens: Secondary | ICD-10-CM | POA: Diagnosis not present

## 2019-11-07 DIAGNOSIS — E119 Type 2 diabetes mellitus without complications: Secondary | ICD-10-CM | POA: Diagnosis not present

## 2019-11-07 DIAGNOSIS — Z0100 Encounter for examination of eyes and vision without abnormal findings: Secondary | ICD-10-CM | POA: Diagnosis not present

## 2019-11-07 DIAGNOSIS — H524 Presbyopia: Secondary | ICD-10-CM | POA: Diagnosis not present

## 2019-11-10 ENCOUNTER — Other Ambulatory Visit: Payer: Self-pay | Admitting: *Deleted

## 2019-11-10 DIAGNOSIS — E119 Type 2 diabetes mellitus without complications: Secondary | ICD-10-CM

## 2019-11-10 MED ORDER — METFORMIN HCL 500 MG PO TABS: ORAL_TABLET | ORAL | 0 refills | Status: DC

## 2019-11-28 ENCOUNTER — Telehealth: Payer: Self-pay | Admitting: Family

## 2019-11-28 NOTE — Chronic Care Management (AMB) (Signed)
  Care Management   Note  11/28/2019 Name: Colleen Duran MRN: [id number] DOB: 10-04-1945  Colleen Duran is a 74 y.o. year old female who is a primary care patient of [name], FNP and is actively engaged with the care management team. I reached out to [name] by phone today to assist with scheduling a follow up visit with the RN Case Manager  Follow up plan: Unsuccessful telephone outreach attempt made. A HIPPA compliant phone message was left for the patient providing contact information and requesting a return call.  The care management team will reach out to the patient again over the next 7 days.  If patient returns call to provider office, please advise to call [facility]  at [place], [place], [place], [place] [address] Direct Dial: [phone] [email or web address] Website: [email or web address]

## 2019-12-01 DIAGNOSIS — Z79899 Other long term (current) drug therapy: Secondary | ICD-10-CM | POA: Diagnosis not present

## 2019-12-01 DIAGNOSIS — E7800 Pure hypercholesterolemia, unspecified: Secondary | ICD-10-CM | POA: Diagnosis not present

## 2019-12-01 DIAGNOSIS — E119 Type 2 diabetes mellitus without complications: Secondary | ICD-10-CM | POA: Diagnosis not present

## 2019-12-01 DIAGNOSIS — M545 Low back pain: Secondary | ICD-10-CM | POA: Diagnosis not present

## 2019-12-01 DIAGNOSIS — R3989 Other symptoms and signs involving the genitourinary system: Secondary | ICD-10-CM | POA: Diagnosis not present

## 2019-12-01 DIAGNOSIS — M25512 Pain in left shoulder: Secondary | ICD-10-CM | POA: Diagnosis not present

## 2019-12-01 DIAGNOSIS — I10 Essential (primary) hypertension: Secondary | ICD-10-CM | POA: Diagnosis not present

## 2019-12-02 NOTE — Chronic Care Management (AMB) (Signed)
  Care Management   Note  12/02/2019 Name: MARDELL SCHULDT MRN: [id number] DOB: October 31, 1945  QUANEISHA APOLLO is a 74 y.o. year old female who is a primary care patient of [name], FNP and is actively engaged with the care management team. I reached out to [name] by phone today to assist with scheduling a follow up visit with the RN Case Manager  Follow up plan: Second Unsuccessful telephone outreach attempt made. A HIPPA compliant phone message was left for the patient providing contact information and requesting a return call.  The care management team will reach out to the patient again over the next 7 days.  If patient returns call to provider office, please advise to call [facility]  at [place], [place], [place], [place] [address] Direct Dial: [phone] [email or web address] Website: [email or web address]

## 2019-12-06 NOTE — Chronic Care Management (AMB) (Signed)
  Care Management   Note  12/06/2019 Name: SHYLO SHANES MRN: [id number] DOB: 1945/11/07  CLEOTILDE DEGRAFF is a 74 y.o. year old female who is a primary care patient of [name], FNP and is actively engaged with the care management team. I reached out to [name] by phone today to assist with scheduling a follow up visit with the RN Case Manager  Follow up plan: Unable to make contact on outreach attempts x 3. PCP Evelina Dun and RN CM Chong Sicilian  notified via routed documentation in medical record.  The patient has been provided with contact information for the care management team and has been advised to call with any health related questions or concerns.   Noreene Larsson, [place], [place], [place] [address] Direct Dial: [phone] [email or web address] Website: [email or web address]

## 2019-12-22 DIAGNOSIS — E119 Type 2 diabetes mellitus without complications: Secondary | ICD-10-CM | POA: Diagnosis not present

## 2019-12-22 DIAGNOSIS — M79672 Pain in left foot: Secondary | ICD-10-CM | POA: Diagnosis not present

## 2019-12-22 DIAGNOSIS — B351 Tinea unguium: Secondary | ICD-10-CM | POA: Diagnosis not present

## 2019-12-22 DIAGNOSIS — M2142 Flat foot [pes planus] (acquired), left foot: Secondary | ICD-10-CM | POA: Diagnosis not present

## 2019-12-22 DIAGNOSIS — M2141 Flat foot [pes planus] (acquired), right foot: Secondary | ICD-10-CM | POA: Diagnosis not present

## 2019-12-22 DIAGNOSIS — M79671 Pain in right foot: Secondary | ICD-10-CM | POA: Diagnosis not present

## 2019-12-29 DIAGNOSIS — M25512 Pain in left shoulder: Secondary | ICD-10-CM | POA: Diagnosis not present

## 2019-12-29 DIAGNOSIS — M75122 Complete rotator cuff tear or rupture of left shoulder, not specified as traumatic: Secondary | ICD-10-CM | POA: Diagnosis not present

## 2020-01-07 DIAGNOSIS — I10 Essential (primary) hypertension: Secondary | ICD-10-CM | POA: Diagnosis not present

## 2020-01-09 DIAGNOSIS — M545 Low back pain: Secondary | ICD-10-CM | POA: Diagnosis not present

## 2020-01-09 DIAGNOSIS — I10 Essential (primary) hypertension: Secondary | ICD-10-CM | POA: Diagnosis not present

## 2020-01-09 DIAGNOSIS — E119 Type 2 diabetes mellitus without complications: Secondary | ICD-10-CM | POA: Diagnosis not present

## 2020-01-09 DIAGNOSIS — Z1159 Encounter for screening for other viral diseases: Secondary | ICD-10-CM | POA: Diagnosis not present

## 2020-01-09 DIAGNOSIS — D539 Nutritional anemia, unspecified: Secondary | ICD-10-CM | POA: Diagnosis not present

## 2020-01-09 DIAGNOSIS — E7800 Pure hypercholesterolemia, unspecified: Secondary | ICD-10-CM | POA: Diagnosis not present

## 2020-01-09 DIAGNOSIS — Z79899 Other long term (current) drug therapy: Secondary | ICD-10-CM | POA: Diagnosis not present

## 2020-01-09 DIAGNOSIS — R8290 Unspecified abnormal findings in urine: Secondary | ICD-10-CM | POA: Diagnosis not present

## 2020-01-09 DIAGNOSIS — M129 Arthropathy, unspecified: Secondary | ICD-10-CM | POA: Diagnosis not present

## 2020-01-09 DIAGNOSIS — E559 Vitamin D deficiency, unspecified: Secondary | ICD-10-CM | POA: Diagnosis not present

## 2020-01-09 DIAGNOSIS — R5383 Other fatigue: Secondary | ICD-10-CM | POA: Diagnosis not present

## 2020-01-25 DIAGNOSIS — E7800 Pure hypercholesterolemia, unspecified: Secondary | ICD-10-CM | POA: Diagnosis not present

## 2020-01-25 DIAGNOSIS — E119 Type 2 diabetes mellitus without complications: Secondary | ICD-10-CM | POA: Diagnosis not present

## 2020-01-25 DIAGNOSIS — Z79899 Other long term (current) drug therapy: Secondary | ICD-10-CM | POA: Diagnosis not present

## 2020-01-25 DIAGNOSIS — M545 Low back pain: Secondary | ICD-10-CM | POA: Diagnosis not present

## 2020-01-25 DIAGNOSIS — I10 Essential (primary) hypertension: Secondary | ICD-10-CM | POA: Diagnosis not present

## 2020-04-03 DIAGNOSIS — G8929 Other chronic pain: Secondary | ICD-10-CM | POA: Diagnosis not present

## 2020-04-03 DIAGNOSIS — M25512 Pain in left shoulder: Secondary | ICD-10-CM | POA: Diagnosis not present

## 2020-04-03 DIAGNOSIS — M1990 Unspecified osteoarthritis, unspecified site: Secondary | ICD-10-CM | POA: Diagnosis not present

## 2020-04-16 DIAGNOSIS — M545 Low back pain: Secondary | ICD-10-CM | POA: Diagnosis not present

## 2020-04-16 DIAGNOSIS — Z79899 Other long term (current) drug therapy: Secondary | ICD-10-CM | POA: Diagnosis not present

## 2020-04-16 DIAGNOSIS — Z9181 History of falling: Secondary | ICD-10-CM | POA: Diagnosis not present

## 2020-04-16 DIAGNOSIS — Z20822 Contact with and (suspected) exposure to covid-19: Secondary | ICD-10-CM | POA: Diagnosis not present

## 2020-04-16 DIAGNOSIS — E611 Iron deficiency: Secondary | ICD-10-CM | POA: Diagnosis not present

## 2020-04-16 DIAGNOSIS — I10 Essential (primary) hypertension: Secondary | ICD-10-CM | POA: Diagnosis not present

## 2020-04-16 DIAGNOSIS — E119 Type 2 diabetes mellitus without complications: Secondary | ICD-10-CM | POA: Diagnosis not present

## 2020-04-16 DIAGNOSIS — E7800 Pure hypercholesterolemia, unspecified: Secondary | ICD-10-CM | POA: Diagnosis not present

## 2020-04-16 DIAGNOSIS — R5383 Other fatigue: Secondary | ICD-10-CM | POA: Diagnosis not present

## 2020-04-16 DIAGNOSIS — E559 Vitamin D deficiency, unspecified: Secondary | ICD-10-CM | POA: Diagnosis not present

## 2020-04-16 DIAGNOSIS — M129 Arthropathy, unspecified: Secondary | ICD-10-CM | POA: Diagnosis not present

## 2020-04-16 DIAGNOSIS — M25512 Pain in left shoulder: Secondary | ICD-10-CM | POA: Diagnosis not present

## 2020-05-10 DIAGNOSIS — M792 Neuralgia and neuritis, unspecified: Secondary | ICD-10-CM | POA: Diagnosis not present

## 2020-05-15 DIAGNOSIS — M25512 Pain in left shoulder: Secondary | ICD-10-CM | POA: Diagnosis not present

## 2020-05-15 DIAGNOSIS — M7542 Impingement syndrome of left shoulder: Secondary | ICD-10-CM | POA: Diagnosis not present

## 2020-05-31 ENCOUNTER — Telehealth: Payer: Self-pay | Admitting: Family

## 2020-06-18 ENCOUNTER — Telehealth: Payer: Self-pay | Admitting: Family Medicine

## 2020-08-06 ENCOUNTER — Other Ambulatory Visit: Payer: Self-pay | Admitting: Orthopedic Surgery

## 2020-08-06 DIAGNOSIS — M12812 Other specific arthropathies, not elsewhere classified, left shoulder: Secondary | ICD-10-CM

## 2020-08-09 ENCOUNTER — Ambulatory Visit
Admission: RE | Admit: 2020-08-09 | Discharge: 2020-08-09 | Disposition: A | Discharge status: Home / Self Care | Payer: Commercial Managed Care - HMO | Source: Ambulatory Visit | Attending: Orthopedic Surgery | Admitting: Orthopedic Surgery

## 2020-08-09 DIAGNOSIS — M12812 Other specific arthropathies, not elsewhere classified, left shoulder: Secondary | ICD-10-CM

## 2020-08-20 NOTE — Progress Notes (Addendum)
COVID Vaccine Completed: yes Date COVID Vaccine completed: x3 COVID vaccine manufacturer:    Moderna      PCP - [name], FNP last office visit 08/30/19 in epic Cardiologist -N/A   Chest x-ray - greater than 1 year EKG - 08/21/2020 in epic Stress Test - N/A ECHO - N/A Cardiac Cath - N/A Pacemaker/ICD device last checked: N/A  Sleep Study - N/A CPAP - N/A  Fasting Blood Sugar - 160's Checks Blood Sugar ___1__ times a day  Blood Thinner Instructions: N/A Aspirin Instructions: N/A Last Dose: N/A  Activity level:  Able to exercise without symptoms     Anesthesia review: N/A  Patient denies shortness of breath, fever, cough and chest pain at PAT appointment   Patient verbalized understanding of instructions that were given to them at the PAT appointment. Patient was also instructed that they will need to review over the PAT instructions again at home before surgery.

## 2020-08-21 ENCOUNTER — Encounter (HOSPITAL_COMMUNITY)
Admission: RE | Admit: 2020-08-21 | Discharge: 2020-08-21 | Disposition: A | Discharge status: Home / Self Care | Payer: Medicare HMO | Source: Ambulatory Visit | Attending: Orthopedic Surgery | Admitting: Orthopedic Surgery

## 2020-08-21 ENCOUNTER — Other Ambulatory Visit (HOSPITAL_COMMUNITY)
Admission: RE | Admit: 2020-08-21 | Discharge: 2020-08-21 | Disposition: A | Discharge status: Home / Self Care | Payer: Medicare HMO | Source: Ambulatory Visit | Attending: Orthopedic Surgery | Admitting: Orthopedic Surgery

## 2020-08-21 ENCOUNTER — Other Ambulatory Visit: Payer: Self-pay

## 2020-08-21 ENCOUNTER — Encounter (HOSPITAL_COMMUNITY): Payer: Self-pay | Admitting: Orthopedic Surgery

## 2020-08-21 DIAGNOSIS — I440 Atrioventricular block, first degree: Secondary | ICD-10-CM | POA: Diagnosis not present

## 2020-08-21 DIAGNOSIS — Z20822 Contact with and (suspected) exposure to covid-19: Secondary | ICD-10-CM | POA: Insufficient documentation

## 2020-08-21 DIAGNOSIS — Z01812 Encounter for preprocedural laboratory examination: Secondary | ICD-10-CM | POA: Insufficient documentation

## 2020-08-21 DIAGNOSIS — Z01818 Encounter for other preprocedural examination: Secondary | ICD-10-CM | POA: Insufficient documentation

## 2020-08-21 LAB — CBC
HCT: 37.5 % (ref 36.0–46.0)
Hemoglobin: 12.5 g/dL (ref 12.0–15.0)
MCH: 30.4 pg (ref 26.0–34.0)
MCHC: 33.3 g/dL (ref 30.0–36.0)
MCV: 91.2 fL (ref 80.0–100.0)
Platelets: 293 10*3/uL (ref 150–400)
RBC: 4.11 MIL/uL (ref 3.87–5.11)
RDW: 13.2 % (ref 11.5–15.5)
WBC: 5.1 10*3/uL (ref 4.0–10.5)
nRBC: 0 % (ref 0.0–0.2)

## 2020-08-21 LAB — GLUCOSE, CAPILLARY: Glucose-Capillary: 135 mg/dL — ABNORMAL HIGH (ref 70–99)

## 2020-08-21 LAB — BASIC METABOLIC PANEL
Anion gap: 8 (ref 5–15)
BUN: 10 mg/dL (ref 8–23)
CO2: 31 mmol/L (ref 22–32)
Calcium: 9.7 mg/dL (ref 8.9–10.3)
Chloride: 101 mmol/L (ref 98–111)
Creatinine, Ser: 0.75 mg/dL (ref 0.44–1.00)
GFR, Estimated: >60 mL/min (ref >60)
Glucose, Bld: 137 mg/dL — ABNORMAL HIGH (ref 70–99)
Potassium: 3.6 mmol/L (ref 3.5–5.1)
Sodium: 140 mmol/L (ref 135–145)

## 2020-08-21 LAB — SARS CORONAVIRUS 2 (TAT 6-24 HRS): SARS Coronavirus 2: NEGATIVE

## 2020-08-21 LAB — HEMOGLOBIN A1C
Hgb A1c MFr Bld: 6.3 % — ABNORMAL HIGH (ref 4.8–5.6)
Mean Plasma Glucose: 134.11 mg/dL

## 2020-08-21 LAB — SURGICAL PCR SCREEN
MRSA, PCR: NEGATIVE
Staphylococcus aureus: NEGATIVE

## 2020-08-21 NOTE — Patient Instructions (Signed)
DUE TO COVID-19 ONLY ONE VISITOR IS ALLOWED TO COME WITH YOU AND STAY IN THE WAITING ROOM ONLY DURING PRE OP AND PROCEDURE.   IF YOU WILL BE ADMITTED INTO THE HOSPITAL YOU ARE ALLOWED ONE SUPPORT PERSON DURING VISITATION HOURS ONLY (10AM -8PM)    The support person may change daily.  The support person must pass our screening, gel in and out, and wear a mask at all times, including in the patients room.  Patients must also wear a mask when staff or their support person are in the room.   COVID SWAB TESTING MUST BE COMPLETED ON:  Tuesday, Dec. 14, 2021 at 2:55 PM   [address] [place], [place] [address]  (Must self quarantine after testing. Follow instructions on handout.)       Your procedure is scheduled on: Thursday, Dec. 16, 2021   Report to [facility] Main  Entrance    Report to admitting at 10 AM   Call this number if you have problems the morning of surgery [phone]   Do not eat food :After Midnight.   May have liquids until 9:30 AM   day of surgery  CLEAR LIQUID DIET  Foods Allowed                                                                     Foods Excluded  Water, Black Coffee and tea, regular and decaf                             liquids that you cannot  Plain Jell-O in any flavor  (No red)                                           see through such as: Fruit ices (not with fruit pulp)                                     milk, soups, orange juice              Iced Popsicles (No red)                                    All solid food                                   Apple juices Sports drinks like Gatorade (No red) Lightly seasoned clear broth or consume(fat free) Sugar, honey syrup  Sample Menu Breakfast                                Lunch                                     Supper  Cranberry juice                    Beef broth                            Chicken broth Jell-O                                     Grape juice                            Apple juice Coffee or tea                        Jell-O                                      Popsicle                                                Coffee or tea                        Coffee or tea      Complete one G2 drink the morning of surgery at 9:30 AM      the day of surgery.   Oral Hygiene is also important to reduce your risk of infection.                                    Remember - BRUSH YOUR TEETH THE MORNING OF SURGERY WITH YOUR REGULAR TOOTHPASTE   Do NOT smoke after Midnight   Take these medicines the morning of surgery with A SIP OF WATER: Amlodipine, Atorvastatin, Buspirone, Hydralzine  DO NOT TAKE ANY ORAL DIABETIC MEDICATIONS DAY OF YOUR SURGERY                               You may not have any metal on your body including hair pins, jewelry, and body piercings             Do not wear make-up, lotions, powders, perfumes/cologne, or deodorant             Do not wear nail polish.  Do not shave  48 hours prior to surgery.                Do not bring valuables to the hospital. [place].   Contacts, dentures or bridgework may not be worn into surgery.   Bring small overnight bag day of surgery.    Special Instructions: Bring a copy of your healthcare power of attorney and living will documents         the day of surgery if you haven't scanned them in before.              Please read over the following fact sheets you  were given: IF YOU HAVE QUESTIONS ABOUT YOUR PRE OP INSTRUCTIONS PLEASE CALL [phone]   [facility]- Preparing for Total Shoulder Arthroplasty    Before surgery, you can play an important role. Because skin is not sterile, your skin needs to be as free of germs as possible. You can reduce the number of germs on your skin by using the following products.  Benzoyl Peroxide Gel o Reduces the number of germs present on the skin o Applied twice a day to shoulder area starting two days before  surgery    ==================================================================  Please follow these instructions carefully:  BENZOYL PEROXIDE 5% GEL  Please do not use if you have an allergy to benzoyl peroxide.   If your skin becomes reddened/irritated stop using the benzoyl peroxide.  Starting two days before surgery, apply as follows: ( Tuesday and Wednesday) 1. Apply benzoyl peroxide in the morning and at night. Apply after taking a shower. If you are not taking a shower clean entire shoulder front, back, and side along with the armpit with a clean wet washcloth.  2. Place a quarter-sized dollop on your shoulder and rub in thoroughly, making sure to cover the front, back, and side of your shoulder, along with the armpit.   2 days before ____ AM   ____ PM              1 day before ____ AM   ____ PM                         3. Do this twice a day for two days.  (Last application is the night before surgery, AFTER using the CHG soap as described below).  4. Do NOT apply benzoyl peroxide gel on the day of surgery. 5.  [facility] - Preparing for Surgery Before surgery, you can play an important role.  Because skin is not sterile, your skin needs to be as free of germs as possible.  You can reduce the number of germs on your skin by washing with CHG (chlorahexidine gluconate) soap before surgery.  CHG is an antiseptic cleaner which kills germs and bonds with the skin to continue killing germs even after washing. Please DO NOT use if you have an allergy to CHG or antibacterial soaps.  If your skin becomes reddened/irritated stop using the CHG and inform your nurse when you arrive at Short Stay. Do not shave (including legs and underarms) for at least 48 hours prior to the first CHG shower.  You may shave your face/neck.  Please follow these instructions carefully:  1.  Shower with CHG Soap the night before surgery and the  morning of surgery.  2.  If you choose to wash your hair, wash your  hair first as usual with your normal  shampoo.  3.  After you shampoo, rinse your hair and body thoroughly to remove the shampoo.                             4.  Use CHG as you would any other liquid soap.  You can apply chg directly to the skin and wash.  Gently with a scrungie or clean washcloth.  5.  Apply the CHG Soap to your body ONLY FROM THE NECK DOWN.   Do   not use on face/ open  Wound or open sores. Avoid contact with eyes, ears mouth and   genitals (private parts).                       Wash face,  Genitals (private parts) with your normal soap.             6.  Wash thoroughly, paying special attention to the area where your    surgery  will be performed.  7.  Thoroughly rinse your body with warm water from the neck down.  8.  DO NOT shower/wash with your normal soap after using and rinsing off the CHG Soap.                9.  Pat yourself dry with a clean towel.            10.  Wear clean pajamas.            11.  Place clean sheets on your bed the night of your first shower and do not  sleep with pets. Day of Surgery : Do not apply any lotions/deodorants the morning of surgery.  Please wear clean clothes to the hospital/surgery center.  FAILURE TO FOLLOW THESE INSTRUCTIONS MAY RESULT IN THE CANCELLATION OF YOUR SURGERY  PATIENT SIGNATURE_________________________________  NURSE SIGNATURE__________________________________  ________________________________________________________________________ How to Manage Your Diabetes Before and After Surgery  Why is it important to control my blood sugar before and after surgery?  Improving blood sugar levels before and after surgery helps healing and can limit problems.  A way of improving blood sugar control is eating a healthy diet by: o  Eating less sugar and carbohydrates o  Increasing activity/exercise o  Talking with your doctor about reaching your blood sugar goals  High blood sugars (greater than 180  mg/dL) can raise your risk of infections and slow your recovery, so you will need to focus on controlling your diabetes during the weeks before surgery.  Make sure that the doctor who takes care of your diabetes knows about your planned surgery including the date and location.  How do I manage my blood sugar before surgery?  Check your blood sugar at least 4 times a day, starting 2 days before surgery, to make sure that the level is not too high or low. o Check your blood sugar the morning of your surgery when you wake up and every 2 hours until you get to the Short Stay unit.  If your blood sugar is less than 70 mg/dL, you will need to treat for low blood sugar: o Do not take insulin. o Treat a low blood sugar (less than 70 mg/dL) with  cup of clear juice (cranberry or apple), 4 glucose tablets, OR glucose gel. o Recheck blood sugar in 15 minutes after treatment (to make sure it is greater than 70 mg/dL). If your blood sugar is not greater than 70 mg/dL on recheck, call [phone] for further instructions.  Report your blood sugar to the short stay nurse when you get to Short Stay.   If you are admitted to the hospital after surgery: o Your blood sugar will be checked by the staff and you will probably be given insulin after surgery (instead of oral diabetes medicines) to make sure you have good blood sugar levels. o The goal for blood sugar control after surgery is 80-180 mg/dL.   WHAT DO I DO ABOUT MY DIABETES MEDICATION?       Do not take oral diabetes  medicines (pills) the morning of surgery.   Reviewed and Endorsed by [facility] Patient Education Committee, August 2015

## 2020-08-23 ENCOUNTER — Ambulatory Visit (HOSPITAL_COMMUNITY): Payer: Medicare HMO

## 2020-08-23 ENCOUNTER — Other Ambulatory Visit: Payer: Self-pay

## 2020-08-23 ENCOUNTER — Ambulatory Visit (HOSPITAL_COMMUNITY): Payer: Medicare HMO | Admitting: Certified Registered Nurse Anesthetist

## 2020-08-23 ENCOUNTER — Encounter (HOSPITAL_COMMUNITY): Payer: Self-pay | Admitting: Orthopedic Surgery

## 2020-08-23 ENCOUNTER — Encounter (HOSPITAL_COMMUNITY)
Admission: RE | Disposition: A | Discharge status: Home / Self Care | Payer: Self-pay | Source: Home / Self Care | Attending: Orthopedic Surgery

## 2020-08-23 ENCOUNTER — Ambulatory Visit (HOSPITAL_COMMUNITY)
Admission: RE | Admit: 2020-08-23 | Discharge: 2020-08-23 | Disposition: A | Discharge status: Home / Self Care | Payer: Medicare HMO | Attending: Orthopedic Surgery | Admitting: Orthopedic Surgery

## 2020-08-23 DIAGNOSIS — Z96653 Presence of artificial knee joint, bilateral: Secondary | ICD-10-CM | POA: Diagnosis not present

## 2020-08-23 DIAGNOSIS — Z79899 Other long term (current) drug therapy: Secondary | ICD-10-CM | POA: Diagnosis not present

## 2020-08-23 DIAGNOSIS — I10 Essential (primary) hypertension: Secondary | ICD-10-CM | POA: Diagnosis not present

## 2020-08-23 DIAGNOSIS — E119 Type 2 diabetes mellitus without complications: Secondary | ICD-10-CM | POA: Diagnosis not present

## 2020-08-23 DIAGNOSIS — Z8249 Family history of ischemic heart disease and other diseases of the circulatory system: Secondary | ICD-10-CM | POA: Diagnosis not present

## 2020-08-23 DIAGNOSIS — M069 Rheumatoid arthritis, unspecified: Secondary | ICD-10-CM | POA: Diagnosis not present

## 2020-08-23 DIAGNOSIS — Z7984 Long term (current) use of oral hypoglycemic drugs: Secondary | ICD-10-CM | POA: Insufficient documentation

## 2020-08-23 DIAGNOSIS — Z823 Family history of stroke: Secondary | ICD-10-CM | POA: Insufficient documentation

## 2020-08-23 DIAGNOSIS — Z791 Long term (current) use of non-steroidal anti-inflammatories (NSAID): Secondary | ICD-10-CM | POA: Diagnosis not present

## 2020-08-23 DIAGNOSIS — Z96612 Presence of left artificial shoulder joint: Secondary | ICD-10-CM

## 2020-08-23 DIAGNOSIS — Z810 Family history of intellectual disabilities: Secondary | ICD-10-CM | POA: Diagnosis not present

## 2020-08-23 DIAGNOSIS — Z801 Family history of malignant neoplasm of trachea, bronchus and lung: Secondary | ICD-10-CM | POA: Diagnosis not present

## 2020-08-23 DIAGNOSIS — Z809 Family history of malignant neoplasm, unspecified: Secondary | ICD-10-CM | POA: Insufficient documentation

## 2020-08-23 DIAGNOSIS — M75122 Complete rotator cuff tear or rupture of left shoulder, not specified as traumatic: Secondary | ICD-10-CM | POA: Diagnosis present

## 2020-08-23 DIAGNOSIS — Z7982 Long term (current) use of aspirin: Secondary | ICD-10-CM | POA: Insufficient documentation

## 2020-08-23 HISTORY — DX: Anemia, unspecified: D64.9

## 2020-08-23 HISTORY — DX: Rheumatoid arthritis, unspecified: M06.9

## 2020-08-23 HISTORY — PX: REVERSE SHOULDER ARTHROPLASTY: SHX5054

## 2020-08-23 LAB — GLUCOSE, CAPILLARY: Glucose-Capillary: 147 mg/dL — ABNORMAL HIGH (ref 70–99)

## 2020-08-23 SURGERY — ARTHROPLASTY, SHOULDER, TOTAL, REVERSE
Anesthesia: General | Site: Shoulder | Laterality: Left

## 2020-08-23 MED ORDER — VANCOMYCIN HCL 1000 MG IV SOLR
INTRAVENOUS | Status: AC
  Filled 2020-08-23: qty 1000

## 2020-08-23 MED ORDER — HYDROMORPHONE HCL 1 MG/ML IJ SOLN: 0.2500 mg | INTRAMUSCULAR | Status: DC | PRN

## 2020-08-23 MED ORDER — DEXAMETHASONE SODIUM PHOSPHATE 10 MG/ML IJ SOLN
INTRAMUSCULAR | Status: AC
  Filled 2020-08-23: qty 1

## 2020-08-23 MED ORDER — CEFAZOLIN SODIUM-DEXTROSE 2-4 GM/100ML-% IV SOLN
2.0000 g | INTRAVENOUS | Status: AC
  Administered 2020-08-23: 14:00:00 2 g via INTRAVENOUS
  Filled 2020-08-23: qty 100

## 2020-08-23 MED ORDER — LACTATED RINGERS IV SOLN: INTRAVENOUS | Status: DC

## 2020-08-23 MED ORDER — LIDOCAINE HCL (PF) 2 % IJ SOLN
INTRAMUSCULAR | Status: AC
  Filled 2020-08-23: qty 5

## 2020-08-23 MED ORDER — LACTATED RINGERS IV BOLUS
500.0000 mL | Freq: Once | INTRAVENOUS | Status: AC
  Administered 2020-08-23: 17:00:00 500 mL via INTRAVENOUS

## 2020-08-23 MED ORDER — ROCURONIUM BROMIDE 10 MG/ML (PF) SYRINGE
PREFILLED_SYRINGE | INTRAVENOUS | Status: AC
  Filled 2020-08-23: qty 10

## 2020-08-23 MED ORDER — ONDANSETRON HCL 4 MG/2ML IJ SOLN
INTRAMUSCULAR | Status: AC
  Filled 2020-08-23: qty 2

## 2020-08-23 MED ORDER — ONDANSETRON HCL 4 MG/2ML IJ SOLN
INTRAMUSCULAR | Status: DC | PRN
  Administered 2020-08-23: 4 mg via INTRAVENOUS

## 2020-08-23 MED ORDER — TRANEXAMIC ACID-NACL 1000-0.7 MG/100ML-% IV SOLN
1000.0000 mg | INTRAVENOUS | Status: AC
  Administered 2020-08-23: 14:00:00 1000 mg via INTRAVENOUS
  Filled 2020-08-23: qty 100

## 2020-08-23 MED ORDER — BUPIVACAINE-EPINEPHRINE (PF) 0.5% -1:200000 IJ SOLN
INTRAMUSCULAR | Status: DC | PRN
  Administered 2020-08-23: 20 mL via PERINEURAL

## 2020-08-23 MED ORDER — MIDAZOLAM HCL 2 MG/2ML IJ SOLN
1.0000 mg | INTRAMUSCULAR | Status: DC
  Administered 2020-08-23: 13:00:00 2 mg via INTRAVENOUS
  Filled 2020-08-23: qty 2

## 2020-08-23 MED ORDER — PROPOFOL 10 MG/ML IV BOLUS
INTRAVENOUS | Status: AC
  Filled 2020-08-23: qty 20

## 2020-08-23 MED ORDER — ORAL CARE MOUTH RINSE: 15.0000 mL | Freq: Once | OROMUCOSAL | Status: AC

## 2020-08-23 MED ORDER — 0.9 % SODIUM CHLORIDE (POUR BTL) OPTIME
TOPICAL | Status: DC | PRN
  Administered 2020-08-23: 14:00:00 1000 mL

## 2020-08-23 MED ORDER — EPHEDRINE 5 MG/ML INJ
INTRAVENOUS | Status: AC
  Filled 2020-08-23: qty 10

## 2020-08-23 MED ORDER — ONDANSETRON 4 MG PO TBDP: 4.0000 mg | ORAL_TABLET | Freq: Three times a day (TID) | ORAL | 0 refills | Status: DC | PRN

## 2020-08-23 MED ORDER — PROPOFOL 10 MG/ML IV BOLUS
INTRAVENOUS | Status: DC | PRN
  Administered 2020-08-23: 100 mg via INTRAVENOUS

## 2020-08-23 MED ORDER — BUPIVACAINE LIPOSOME 1.3 % IJ SUSP
INTRAMUSCULAR | Status: DC | PRN
  Administered 2020-08-23: 10 mL via PERINEURAL

## 2020-08-23 MED ORDER — SODIUM CHLORIDE 0.9 % IR SOLN
Status: DC | PRN
  Administered 2020-08-23: 1000 mL

## 2020-08-23 MED ORDER — PHENYLEPHRINE 40 MCG/ML (10ML) SYRINGE FOR IV PUSH (FOR BLOOD PRESSURE SUPPORT)
PREFILLED_SYRINGE | INTRAVENOUS | Status: AC
  Filled 2020-08-23: qty 20

## 2020-08-23 MED ORDER — TRANEXAMIC ACID-NACL 1000-0.7 MG/100ML-% IV SOLN: 1000.0000 mg | Freq: Once | INTRAVENOUS | Status: DC

## 2020-08-23 MED ORDER — FENTANYL CITRATE (PF) 100 MCG/2ML IJ SOLN
INTRAMUSCULAR | Status: DC | PRN
  Administered 2020-08-23 (×2): 50 ug via INTRAVENOUS

## 2020-08-23 MED ORDER — CHLORHEXIDINE GLUCONATE 0.12 % MT SOLN
15.0000 mL | Freq: Once | OROMUCOSAL | Status: AC
  Administered 2020-08-23: 11:00:00 15 mL via OROMUCOSAL

## 2020-08-23 MED ORDER — LACTATED RINGERS IV BOLUS
250.0000 mL | Freq: Once | INTRAVENOUS | Status: AC
  Administered 2020-08-23: 17:00:00 250 mL via INTRAVENOUS

## 2020-08-23 MED ORDER — MEPERIDINE HCL 50 MG/ML IJ SOLN: 6.2500 mg | INTRAMUSCULAR | Status: DC | PRN

## 2020-08-23 MED ORDER — ESMOLOL HCL 100 MG/10ML IV SOLN
INTRAVENOUS | Status: DC | PRN
  Administered 2020-08-23 (×2): 30 mg via INTRAVENOUS

## 2020-08-23 MED ORDER — FENTANYL CITRATE (PF) 100 MCG/2ML IJ SOLN
INTRAMUSCULAR | Status: AC
  Filled 2020-08-23: qty 2

## 2020-08-23 MED ORDER — FENTANYL CITRATE (PF) 100 MCG/2ML IJ SOLN
50.0000 ug | INTRAMUSCULAR | Status: DC
  Administered 2020-08-23: 13:00:00 50 ug via INTRAVENOUS
  Filled 2020-08-23: qty 2

## 2020-08-23 MED ORDER — SUGAMMADEX SODIUM 500 MG/5ML IV SOLN
INTRAVENOUS | Status: DC | PRN
  Administered 2020-08-23: 300 mg via INTRAVENOUS

## 2020-08-23 MED ORDER — ONDANSETRON HCL 4 MG/2ML IJ SOLN: 4.0000 mg | Freq: Once | INTRAMUSCULAR | Status: DC | PRN

## 2020-08-23 MED ORDER — ESMOLOL HCL 100 MG/10ML IV SOLN
INTRAVENOUS | Status: AC
  Filled 2020-08-23: qty 10

## 2020-08-23 MED ORDER — OXYCODONE HCL 5 MG PO TABS: 5.0000 mg | ORAL_TABLET | Freq: Three times a day (TID) | ORAL | 0 refills | Status: AC | PRN

## 2020-08-23 MED ORDER — VANCOMYCIN HCL 1000 MG IV SOLR
INTRAVENOUS | Status: DC | PRN
  Administered 2020-08-23: 1000 mg via TOPICAL

## 2020-08-23 MED ORDER — PHENYLEPHRINE HCL-NACL 10-0.9 MG/250ML-% IV SOLN
INTRAVENOUS | Status: DC | PRN
  Administered 2020-08-23: 50 ug/min via INTRAVENOUS

## 2020-08-23 MED ORDER — DEXAMETHASONE SODIUM PHOSPHATE 10 MG/ML IJ SOLN
INTRAMUSCULAR | Status: DC | PRN
  Administered 2020-08-23: 5 mg via INTRAVENOUS

## 2020-08-23 MED ORDER — SUGAMMADEX SODIUM 500 MG/5ML IV SOLN
INTRAVENOUS | Status: AC
  Filled 2020-08-23: qty 5

## 2020-08-23 MED ORDER — ROCURONIUM BROMIDE 10 MG/ML (PF) SYRINGE
PREFILLED_SYRINGE | INTRAVENOUS | Status: DC | PRN
  Administered 2020-08-23: 90 mg via INTRAVENOUS

## 2020-08-23 SURGICAL SUPPLY — 63 items
BIT DRILL FLUTED 3.0 STRL (BIT) ×3 IMPLANT
BLADE SAG 18X100X1.27 (BLADE) ×3 IMPLANT
CALIBRATOR GLENOID VIP 5-D (SYSTAGENIX WOUND MANAGEMENT) ×3 IMPLANT
CLOSURE WOUND 1/2 X4 (GAUZE/BANDAGES/DRESSINGS) ×1
COVER SURGICAL LIGHT HANDLE (MISCELLANEOUS) ×3 IMPLANT
COVER WAND RF STERILE (DRAPES) ×3 IMPLANT
CUP SUT UNIV REVERS 36+2 LEFT (Cup) ×3 IMPLANT
DRAPE IMP U-DRAPE 54X76 (DRAPES) ×6 IMPLANT
DRAPE INCISE IOBAN 66X45 STRL (DRAPES) ×3 IMPLANT
DRAPE ORTHO SPLIT 77X108 STRL (DRAPES) ×4
DRAPE SURG 17X23 STRL (DRAPES) ×3 IMPLANT
DRAPE SURG ORHT 6 SPLT 77X108 (DRAPES) ×2 IMPLANT
DRAPE U-SHAPE 47X51 STRL (DRAPES) ×3 IMPLANT
DRESSING AQUACEL AG SP 3.5X6 (GAUZE/BANDAGES/DRESSINGS) ×1 IMPLANT
DRSG AQUACEL AG ADV 3.5X10 (GAUZE/BANDAGES/DRESSINGS) ×3 IMPLANT
DRSG AQUACEL AG SP 3.5X6 (GAUZE/BANDAGES/DRESSINGS) ×3
DURAPREP 26ML APPLICATOR (WOUND CARE) ×3 IMPLANT
ELECT BLADE TIP CTD 4 INCH (ELECTRODE) ×3 IMPLANT
ELECT REM PT RETURN 15FT ADLT (MISCELLANEOUS) ×3 IMPLANT
GLENOID UNI REV MOD 24 +2 LAT (Joint) ×3 IMPLANT
GLENOSPHERE 33+4 LAT/24 UNI RV (Joint) ×3 IMPLANT
GLOVE BIO SURGEON STRL SZ7.5 (GLOVE) ×6 IMPLANT
GLOVE BIOGEL PI IND STRL 8 (GLOVE) ×2 IMPLANT
GLOVE BIOGEL PI INDICATOR 8 (GLOVE) ×4
GOWN STRL REUS W/ TWL LRG LVL3 (GOWN DISPOSABLE) ×1 IMPLANT
GOWN STRL REUS W/ TWL XL LVL3 (GOWN DISPOSABLE) ×2 IMPLANT
GOWN STRL REUS W/TWL LRG LVL3 (GOWN DISPOSABLE) ×2
GOWN STRL REUS W/TWL XL LVL3 (GOWN DISPOSABLE) ×4
INSERT HUMERAL COMB 36 +6/33 (Stem) ×3 IMPLANT
KIT BASIN OR (CUSTOM PROCEDURE TRAY) ×3 IMPLANT
KIT TURNOVER KIT A (KITS) ×3 IMPLANT
MANIFOLD NEPTUNE II (INSTRUMENTS) ×3 IMPLANT
NEEDLE 1/2 CIR CATGUT .05X1.09 (NEEDLE) ×3 IMPLANT
NS IRRIG 1000ML POUR BTL (IV SOLUTION) ×3 IMPLANT
PACK SHOULDER (CUSTOM PROCEDURE TRAY) ×3 IMPLANT
PAD ARMBOARD 7.5X6 YLW CONV (MISCELLANEOUS) ×6 IMPLANT
PIN NITINOL TARGETER 2.8 (PIN) ×3 IMPLANT
PIN SET MODULAR GLENOID SYSTEM (PIN) ×3 IMPLANT
RESTRAINT HEAD UNIVERSAL NS (MISCELLANEOUS) ×3 IMPLANT
SCREW CENTRAL MOD 30MM (Screw) ×3 IMPLANT
SCREW PERI LOCK 5.5X16 (Screw) ×6 IMPLANT
SCREW PERI LOCK 5.5X24 (Screw) ×6 IMPLANT
SLING ARM IMMOBILIZER LRG (SOFTGOODS) IMPLANT
SLING ARM IMMOBILIZER MED (SOFTGOODS) IMPLANT
SPONGE LAP 18X18 RF (DISPOSABLE) IMPLANT
SPONGE LAP 4X18 RFD (DISPOSABLE) ×3 IMPLANT
STEM HUM UNIV REV APEX SZ 6 (Stem) ×3 IMPLANT
STRIP CLOSURE SKIN 1/2X4 (GAUZE/BANDAGES/DRESSINGS) ×2 IMPLANT
SUCTION FRAZIER HANDLE 10FR (MISCELLANEOUS) ×2
SUCTION TUBE FRAZIER 10FR DISP (MISCELLANEOUS) ×1 IMPLANT
SUT FIBERWIRE #2 38 T-5 BLUE (SUTURE) ×3
SUT MNCRL AB 4-0 PS2 18 (SUTURE) ×3 IMPLANT
SUT VIC AB 1 CT1 27 (SUTURE)
SUT VIC AB 1 CT1 27XBRD ANBCTR (SUTURE) IMPLANT
SUT VIC AB 2-0 CT1 27 (SUTURE) ×2
SUT VIC AB 2-0 CT1 TAPERPNT 27 (SUTURE) ×1 IMPLANT
SUTURE FIBERWR #2 38 T-5 BLUE (SUTURE) ×1 IMPLANT
SYR CONTROL 10ML LL (SYRINGE) ×3 IMPLANT
TOWEL OR 17X26 10 PK STRL BLUE (TOWEL DISPOSABLE) ×3 IMPLANT
TOWEL OR NON WOVEN STRL DISP B (DISPOSABLE) ×3 IMPLANT
TOWER CARTRIDGE SMART MIX (DISPOSABLE) IMPLANT
WATER STERILE IRR 1000ML POUR (IV SOLUTION) ×3 IMPLANT
YANKAUER SUCT BULB TIP NO VENT (SUCTIONS) ×3 IMPLANT

## 2020-08-23 NOTE — Brief Op Note (Signed)
08/23/2020  3:29 PM  PATIENT:  Colleen Duran  74 y.o. female  PRE-OPERATIVE DIAGNOSIS:  Left shoulder rotator cuff arthopathy  POST-OPERATIVE DIAGNOSIS:  Left shoulder rotator cuff arthopathy  PROCEDURE:  Procedure(s) with comments: REVERSE SHOULDER ARTHROPLASTY (Left) - 2.5 hrs  SURGEON:  Surgeon(s) and Role:    * Stann Mainland, Elly Modena, MD - Primary  PHYSICIAN ASSISTANT: Jonelle Sidle, PA-C  ANESTHESIA:   regional and general  EBL:  200 mL   BLOOD ADMINISTERED:none  DRAINS: none   LOCAL MEDICATIONS USED:  NONE  SPECIMEN:  No Specimen  DISPOSITION OF SPECIMEN:  N/A  COUNTS:  YES  TOURNIQUET:  * No tourniquets in log *  DICTATION: .Note written in EPIC  PLAN OF CARE: Discharge to home after PACU  PATIENT DISPOSITION:  PACU - hemodynamically stable.   Delay start of Pharmacological VTE agent (>24hrs) due to surgical blood loss or risk of bleeding: not applicable

## 2020-08-23 NOTE — Transfer of Care (Signed)
Immediate Anesthesia Transfer of Care Note  Patient: Colleen Duran  Procedure(s) Performed: REVERSE SHOULDER ARTHROPLASTY (Left Shoulder)  Patient Location: PACU  Anesthesia Type:GA combined with regional for post-op pain  Level of Consciousness: awake, drowsy and patient cooperative  Airway & Oxygen Therapy: Patient Spontanous Breathing and Patient connected to face mask oxygen  Post-op Assessment: Report given to RN and Post -op Vital signs reviewed and stable  Post vital signs: Reviewed and stable  Last Vitals:  Vitals Value Taken Time  BP    Temp 36.1 C 08/23/20 1607  Pulse    Resp 30 08/23/20 1608  SpO2    Vitals shown include unvalidated device data.  Last Pain:  Vitals:   08/23/20 1327  TempSrc:   PainSc: 0-No pain      Patients Stated Pain Goal: 3 (33/83/29 1916)  Complications: No complications documented.

## 2020-08-23 NOTE — Anesthesia Preprocedure Evaluation (Signed)
Anesthesia Evaluation  Patient identified by MRN, date of birth, ID band Patient awake    Reviewed: Allergy & Precautions, NPO status , Patient's Chart, lab work & pertinent test results  Airway Mallampati: I  TM Distance: >3 FB Neck ROM: Full    Dental   Pulmonary    Pulmonary exam normal        Cardiovascular hypertension, Pt. on medications Normal cardiovascular exam     Neuro/Psych    GI/Hepatic   Endo/Other  diabetes, Type 2, Oral Hypoglycemic Agents  Renal/GU      Musculoskeletal   Abdominal   Peds  Hematology   Anesthesia Other Findings   Reproductive/Obstetrics                             Anesthesia Physical Anesthesia Plan  ASA: II  Anesthesia Plan: General   Post-op Pain Management:  Regional for Post-op pain   Induction: Intravenous  PONV Risk Score and Plan: 3 and Midazolam, Ondansetron and Treatment may vary due to age or medical condition  Airway Management Planned: Oral ETT  Additional Equipment:   Intra-op Plan:   Post-operative Plan: Extubation in OR  Informed Consent: I have reviewed the patients History and Physical, chart, labs and discussed the procedure including the risks, benefits and alternatives for the proposed anesthesia with the patient or authorized representative who has indicated his/her understanding and acceptance.       Plan Discussed with: CRNA and Surgeon  Anesthesia Plan Comments:         Anesthesia Quick Evaluation

## 2020-08-23 NOTE — Anesthesia Postprocedure Evaluation (Signed)
Anesthesia Post Note  Patient: Colleen Duran  Procedure(s) Performed: REVERSE SHOULDER ARTHROPLASTY (Left Shoulder)     Patient location during evaluation: PACU Anesthesia Type: General Level of consciousness: awake and alert Pain management: pain level controlled Vital Signs Assessment: post-procedure vital signs reviewed and stable Respiratory status: spontaneous breathing, nonlabored ventilation, respiratory function stable and patient connected to nasal cannula oxygen Cardiovascular status: blood pressure returned to baseline and stable Postop Assessment: no apparent nausea or vomiting Anesthetic complications: no   No complications documented.  Last Vitals:  Vitals:   08/23/20 1645 08/23/20 1712  BP: (!) 169/90 (!) 168/93  Pulse: 89   Resp: 12 14  Temp: 36.4 C 36.8 C  SpO2: 98% 97%    Last Pain:  Vitals:   08/23/20 1712  TempSrc: Oral  PainSc: 0-No pain                 Raychelle Hudman DAVID

## 2020-08-23 NOTE — Evaluation (Signed)
Occupational Therapy Evaluation Patient Details Name: Colleen Duran MRN: [id number] DOB: 09/18/1945 Today's Date: 08/23/2020    History of Present Illness s/p Left reverse TSA   Clinical Impression   Mrs. Colleen Duran is a 74 year old woman s/p shoulder replacement without functional use of left non-dominant upper extremity secondary to effects of interscalene block and shoulder precautions. Therapist provided education and instruction to patient in regards to exercises, precautions, positioning, donning upper extremity clothing and bathing while maintaining shoulder precautions, ice and edema management and donning/doffing sling. Patient verbalized understanding. Handouts provided to maximize retention of information. Patient needed assistance to donn shirt, pants, socks and shoes. Patient to follow up with MD for further therapy needs.      Follow Up Recommendations  Follow surgeon's recommendation for DC plan and follow-up therapies    Equipment Recommendations  None recommended by OT    Recommendations for Other Services       Precautions / Restrictions Precautions Precautions: Shoulder Type of Shoulder Precautions: AROM/PROM FF to 90 degrees, abduction to 60 degrees, external rotation to 30 degrees. Shoulder Interventions: Shoulder sling/immobilizer;For comfort Precaution Comments: Per Op Note: Ms. Colleen Duran will be in her sling for comfort only.  You may remove this and begin immediate active and passive motion as tolerated.  She should sleep in the sling as well.  Otherwise no lifting over 2 pounds. Restrictions Weight Bearing Restrictions: Yes LUE Weight Bearing: Non weight bearing      Mobility Bed Mobility                    Transfers Overall transfer level: Modified independent                    Balance Overall balance assessment: Mild deficits observed, not formally tested                                         ADL either performed  or assessed with clinical judgement   ADL Overall ADL's : Needs assistance/impaired Eating/Feeding: Set up   Grooming: Modified independent   Upper Body Bathing: Minimal assistance   Lower Body Bathing: Minimal assistance   Upper Body Dressing : Moderate assistance   Lower Body Dressing: Moderate assistance   Toilet Transfer: Supervision/safety   Toileting- Clothing Manipulation and Hygiene: Supervision/safety               Vision Patient Visual Report: No change from baseline       Perception     Praxis      Pertinent Vitals/Pain Pain Assessment: No/denies pain     Hand Dominance     Extremity/Trunk Assessment Upper Extremity Assessment Upper Extremity Assessment: LUE deficits/detail LUE Deficits / Details: Minimal active movement secondary to interscalene block.   Lower Extremity Assessment Lower Extremity Assessment: Overall WFL for tasks assessed   Cervical / Trunk Assessment Cervical / Trunk Assessment: Normal   Communication     Cognition Arousal/Alertness: Awake/alert Behavior During Therapy: WFL for tasks assessed/performed Overall Cognitive Status: Within Functional Limits for tasks assessed                                     General Comments  reports use of cane PRN    Exercises     Shoulder Instructions Shoulder Instructions Donning/doffing  shirt without moving shoulder: Patient able to independently direct caregiver Method for sponge bathing under operated UE: Independent Donning/doffing sling/immobilizer: Patient able to independently direct caregiver Correct positioning of sling/immobilizer: Independent ROM for elbow, wrist and digits of operated UE: Independent Sling wearing schedule (on at all times/off for ADL's): Independent Proper positioning of operated UE when showering: Independent Dressing change: Independent Positioning of UE while sleeping: [place]                                           Prior Functioning/Environment                   OT Problem List: Impaired UE functional use;Decreased range of motion;Decreased strength      OT Treatment/Interventions:      OT Goals(Current goals can be found in the care plan section) Acute Rehab OT Goals OT Goal Formulation: All assessment and education complete, DC therapy  OT Frequency:     Barriers to D/C:            Co-evaluation              AM-PAC OT "6 Clicks" Daily Activity     Outcome Measure Help from another person eating meals?: A Little Help from another person taking care of personal grooming?: None Help from another person toileting, which includes using toliet, bedpan, or urinal?: None Help from another person bathing (including washing, rinsing, drying)?: A Little Help from another person to put on and taking off regular upper body clothing?: A Lot Help from another person to put on and taking off regular lower body clothing?: A Lot 6 Click Score: 18   End of Session Nurse Communication:  (OT education complete, ready for discharge.)  Activity Tolerance: Patient tolerated treatment well Patient left: in chair  OT Visit Diagnosis: Muscle weakness (generalized) (M62.81)                Time: 1723-1750 OT Time Calculation (min): 27 min Charges:  OT General Charges $OT Visit: 1 Visit OT Evaluation $OT Eval Low Complexity: 1 Low OT Treatments $Self Care/Home Management : 8-22 mins  Jimmy Plessinger, OTR/L [place]  Office [phone] Pager: [phone]   Lenward Chancellor 08/23/2020, 5:56 PM

## 2020-08-23 NOTE — H&P (Signed)
ORTHOPAEDIC H and P  REQUESTING PHYSICIAN: Nicholes Stairs, MD  PCP:  [name], FNP  Chief Complaint: Left shoulder pain  HPI: Colleen Duran is a 74 y.o. female who complains of left shoulder pain and dysfunction.  She has rotator cuff arthropathy of that arm.  She is here today for definitive treatment with reverse shoulder arthroplasty.  No new complaints at this time.  Previous office visit note reviewed and discussed with patient.  Past Medical History:  Diagnosis Date  . Anemia   . Cataract    Bi-lateral, [place], [place]  . Diabetes mellitus without complication ([place])    TYPE 2  . High cholesterol   . Hypertension   . RHA (rheumatoid arthritis) ([facility])    RA  . Wears glasses    Past Surgical History:  Procedure Laterality Date  . ABDOMINAL HYSTERECTOMY    . BUNIONECTOMY     bilateral feet  . CATARACT EXTRACTION, BILATERAL    . CESAREAN SECTION    . COLONOSCOPY N/A 05/11/2015   Procedure: COLONOSCOPY;  Surgeon: Danie Binder, MD;  [facility];  Service: Endoscopy;  Laterality: N/A;  2:15 PM  . TOTAL KNEE ARTHROPLASTY Left 07/24/2014   Procedure: LEFT TOTAL KNEE ARTHROPLASTY;  Surgeon: Vickey Huger, MD;  Location: [facility];  Service: Orthopedics;  Laterality: Left;  . TOTAL KNEE ARTHROPLASTY Right 10/08/2015   Procedure: RIGHT TOTAL KNEE ARTHROPLASTY;  Surgeon: Vickey Huger, MD;  Location: [facility];  Service: Orthopedics;  Laterality: Right;   Social History   Socioeconomic History  . Marital status: Widowed    Spouse name: Not on file  . Number of children: 2  . Years of education: 38  . Highest education level: 12th grade  Occupational History  . Occupation: Retired    Comment: [employer]  Tobacco Use  . Smoking status: Never Smoker  . Smokeless tobacco: Never Used  Vaping Use  . Vaping Use: Never used  Substance and Sexual Activity  . Alcohol use: No  . Drug use: No  . Sexual activity: Not Currently     Birth control/protection: Surgical  Other Topics Concern  . Not on file  Social History Narrative   Ms Hajduk lives with her daughter and grandchildren.    Social Determinants of Health   Financial Resource Strain: Not on file  Food Insecurity: Not on file  Transportation Needs: Not on file  Physical Activity: Not on file  Stress: Not on file  Social Connections: Not on file   Family History  Problem Relation Age of Onset  . Hypertension Mother   . Stroke Mother   . Healthy Daughter   . Cancer Sister        lung  . Cancer Brother   . Mental retardation Brother   . Healthy Daughter    No Known Allergies Prior to Admission medications   Medication Sig Start Date End Date Taking? Authorizing Provider  acetaminophen (TYLENOL) 500 MG tablet Take 1,000 mg by mouth every 6 (six) hours as needed for mild pain.   Yes [provider]  amLODipine (NORVASC) 10 MG tablet TAKE ONE TABLET BY MOUTH DAILY. Patient taking differently: Take 10 mg by mouth daily. 07/14/19  Yes Hawks, Christy A, FNP  aspirin EC 81 MG tablet Take 1 tablet (81 mg total) by mouth daily. 10/30/17  Yes Hawks, Christy A, FNP  atorvastatin (LIPITOR) 40 MG tablet Take 40 mg by mouth daily.   Yes [provider]  bismuth subsalicylate (PEPTO BISMOL) 262 MG/15ML suspension Take 30 mLs by mouth every 6 (six) hours as needed for indigestion.   Yes [provider]  busPIRone (BUSPAR) 5 MG tablet Take 5 mg by mouth 3 (three) times daily.   Yes [provider]  ferrous sulfate 324 MG TBEC Take 324 mg by mouth daily.   Yes [provider]  hydrALAZINE (APRESOLINE) 25 MG tablet Take 25 mg by mouth 3 (three) times daily.   Yes [provider]  losartan-hydrochlorothiazide (HYZAAR) 100-25 MG tablet Take 1 tablet by mouth daily.   Yes [provider]  metFORMIN (GLUCOPHAGE) 500 MG tablet TAKE ONE TABLET BY MOUTH TWICE DAILY. TAKE WITH A MEAL. Patient taking differently: Take  500 mg by mouth 2 (two) times daily with a meal. 11/10/19  Yes Hawks, Christy A, FNP  Multiple Vitamin (MULTIVITAMIN WITH MINERALS) TABS tablet Take 1 tablet by mouth daily.   Yes [provider]  multivitamin-lutein (OCUVITE-LUTEIN) CAPS capsule Take 1 capsule by mouth daily.   Yes [provider]  traMADol (ULTRAM) 50 MG tablet Take 50 mg by mouth at bedtime.   Yes [provider]  Vitamin D, Ergocalciferol, (DRISDOL) 1.25 MG (50000 UNIT) CAPS capsule Take 50,000 Units by mouth every Monday.   Yes [provider]  atorvastatin (LIPITOR) 20 MG tablet TAKE ONE TABLET BY MOUTH DAILY Patient not taking: No sig reported 07/25/19   Evelina Dun A, FNP  diclofenac Sodium (VOLTAREN) 1 % GEL Apply 1 application topically daily as needed for pain. 04/03/20   [provider]  losartan-hydrochlorothiazide (HYZAAR) 100-12.5 MG tablet TAKE 1 TABLET ONCE DAILY. Patient not taking: No sig reported 09/30/19   Evelina Dun A, FNP   No results found.  Positive ROS: All other systems have been reviewed and were otherwise negative with the exception of those mentioned in the HPI and as above.  Physical Exam: General: Alert, no acute distress Cardiovascular: No pedal edema Respiratory: No cyanosis, no use of accessory musculature GI: No organomegaly, abdomen is soft and non-tender Skin: No lesions in the area of chief complaint Neurologic: Sensation intact distally Psychiatric: Patient is competent for consent with normal mood and affect Lymphatic: No axillary or cervical lymphadenopathy  MUSCULOSKELETAL:  Left upper extremity Warm and well-perfused.  No open wounds or lesions.  Neurovascular intact  Assessment: Left shoulder rotator cuff arthropathy  Plan: -Plan to proceed today with reverse shoulder arthroplasty.  This will be performed as definitive treatment for the massive and retracted rotator cuff tear.  We again reviewed the risk of bleeding,  infection, damage to surrounding nerves and vessels, dislocation, fracture, persistent pain and stiffness, and the risk of anesthesia.  She has provided informed consent.  -Plan will be for discharge home from PACU unless otherwise issues intraoperatively.    Nicholes Stairs, MD Cell [phone]    08/23/2020 1:07 PM

## 2020-08-23 NOTE — Anesthesia Procedure Notes (Signed)
Procedure Name: Intubation Date/Time: 08/23/2020 1:54 PM Performed by: West Pugh, CRNA Pre-anesthesia Checklist: Patient identified, Emergency Drugs available, Suction available, Patient being monitored and Timeout performed Patient Re-evaluated:Patient Re-evaluated prior to induction Oxygen Delivery Method: Circle system utilized Preoxygenation: Pre-oxygenation with 100% oxygen Induction Type: IV induction Ventilation: Mask ventilation without difficulty Laryngoscope Size: Mac and 3 Grade View: Grade II Tube type: Oral Tube size: 7.5 mm Number of attempts: 1 Airway Equipment and Method: Stylet Placement Confirmation: ETT inserted through vocal cords under direct vision,  positive ETCO2,  CO2 detector and breath sounds checked- equal and bilateral Secured at: 22 cm Tube secured with: Tape Dental Injury: Teeth and Oropharynx as per pre-operative assessment

## 2020-08-23 NOTE — Progress Notes (Signed)
Time out completed.  AssistedDr. Conrad Oxford with left, ultrasound guided, interscalene  block. Side rails up, monitors on throughout procedure. See vital signs in flow sheet. Tolerated Procedure well.

## 2020-08-23 NOTE — Discharge Instructions (Signed)
-  Maintain arm in sling for comfort only.  He may remove and begin range of motion as tolerated.  -No lifting with the left arm over 2 pounds.  -Maintain postoperative bandage until your follow-up appointment.  You may shower with this in place, but do not submerge underwater.  -For mild to moderate pain use Tylenol and Advil around-the-clock.  For any breakthrough pain use oxycodone as necessary.  -You may apply ice to the left shoulder and you should do this as able around-the-clock.  This should be a 30-minute period of icing.  -Follow-up with Dr. Stann Mainland in 2 weeks for routine postop care.

## 2020-08-23 NOTE — Anesthesia Procedure Notes (Signed)
Anesthesia Regional Block: Interscalene brachial plexus block   Pre-Anesthetic Checklist: ,, timeout performed, Correct Patient, Correct Site, Correct Laterality, Correct Procedure, Correct Position, site marked, Risks and benefits discussed,  Surgical consent,  Pre-op evaluation,  At surgeon's request and post-op pain management  Laterality: Left  Prep: chloraprep       Needles:  Injection technique: Single-shot  Needle Type: Other     Needle Length: 9cm  Needle Gauge: 21   Needle insertion depth: 5 cm   Additional Needles:   Procedures:, nerve stimulator,,,,,,,  Narrative:  Start time: 08/23/2020 12:55 PM End time: 08/23/2020 1:10 PM Injection made incrementally with aspirations every 5 mL.  Performed by: Personally  Anesthesiologist: Lillia Abed, MD  Additional Notes: Monitors applied. Patient sedated. Sterile prep and drape,hand hygiene and sterile gloves were used. Needle position confirmed with evoked response at 0.4 mV.Local anesthetic injected incrementally after negative aspiration.Vascular puncture avoided. No complications. The patient tolerated the procedure well.

## 2020-08-23 NOTE — Op Note (Signed)
08/23/2020  3:30 PM  PATIENT:  Colleen Duran    PRE-OPERATIVE DIAGNOSIS:  Left shoulder rotator cuff arthopathy  POST-OPERATIVE DIAGNOSIS:  Same  PROCEDURE:  REVERSE SHOULDER ARTHROPLASTY  SURGEON:  Nicholes Stairs, MD  ASSISTANT: Jonelle Sidle, PA-C  Assistant attestation: PA Mcclung was utilized for positioning the patient, prepping and draping the extremity, approach to the shoulder, implantation and trialing of the final reverse shoulder arthroplasty, and closure of wounds and application of postoperative dressings and sling.  ANESTHESIA:   General  ESTIMATED BLOOD LOSS: 200 cc  PREOPERATIVE INDICATIONS:  Colleen Duran is a  74 y.o. female with a diagnosis of Left shoulder rotator cuff arthopathy who failed conservative measures and elected for surgical management.    The risks benefits and alternatives were discussed with the patient preoperatively including but not limited to the risks of infection, bleeding, nerve injury, cardiopulmonary complications, the need for revision surgery, dislocation, brachial plexus palsy, incomplete relief of pain, among others, and the patient was willing to proceed.  OPERATIVE IMPLANTS:  Arthrex reverse shoulder arthroplasty system Size 6 reverse apex stem Size 33+4 mm offset glenosphere 24 mm +2 mm lateralized baseplate with a 30 mm compression screw centrally and 4 peripheral locking screws. Standard humeral tray with a +6 mm polyethylene   OPERATIVE FINDINGS:  Colleen Duran had significant superior migration of the humerus with acetabularization of the acromium consistent with rotator cuff arthropathy.  She had complete and chronic appearing tears of the supraspinatus and infraspinatus with retraction to the level of the glenoid.  Furthermore, she had partial upper border tearing of the subscapularis with moderate thinning of the tendon.  OPERATIVE PROCEDURE: The patient was brought to the operating room and placed in the supine position.  General anesthesia was administered. IV antibiotics were given. A Foley was placed. Time out was performed. The upper extremity was prepped and draped in usual sterile fashion. The patient was in a beachchair position. Deltopectoral approach was carried out. The biceps was tenotomized.  The subscapularis was released off of the bone.   I then performed circumferential releases of the humerus, and then dislocated the head, and then reamed with the reamer to the above named size.  I then applied the jig, and cut the humeral head in 30 of retroversion, and then turned my attention to the glenoid.  Deep retractors were placed, and I resected the labrum, and then placed a guidepin into the center position on the glenoid, with slight inferior inclination. I then reamed over the guidepin, which was placed utilizing the VIP preoperative software, and this created a small metaphyseal cancellus blush inferiorly, removing just the cartilage to the subchondral bone superiorly. The base plate was selected and impacted place, and then I secured it centrally with a nonlocking screw, and I had excellent purchase both inferiorly and superiorly. I placed a short locking screws on anterior and posterior aspects.  I then turned my attention to the glenosphere, and impacted this into place.  We trialed both the 36 mm diameter as well as 33, and found a 33 to be adequate and with ample inferior coverage.  The glenoid sphere was completely seated, and had engagement of the [facility] taper. I then turned my attention back to the humerus.  I sequentially broached, and then trialed, and was found to restore soft tissue tension, and it had 2 finger tightness. Therefore the above named components were selected. The shoulder felt stable throughout functional motion.  I then impacted the real  prosthesis into place, as well as the real humeral tray, and reduced the shoulder. The shoulder had excellent motion, and was stable, and I  irrigated the wounds copiously.  There was noted to be some impingement between the greater tuberosity and the acromium anteriorly.  We utilized the rondure to recessed some of the prominence of the greater tuberosity.  The subscapularis was repaired back to the suture cup with a horizontal mattress and Mason-Allen style locking stitch into the humeral tray.  I then irrigated the shoulder copiously once more, repaired the deltopectoral interval with #2 FiberWire followed by subcutaneous Vicryl, then monocryl for the skin,  with Steri-Strips and sterile gauze for the skin. The patient was awakened and returned back in stable and satisfactory condition. There no complications and they tolerated the procedure well.  All counts were correct x2.    Disposition:  Colleen Duran will be in her sling for comfort only.  You may remove this and begin immediate active and passive motion as tolerated.  She should sleep in the sling as well.  Otherwise no lifting over 2 pounds.  She will discharge home from PACU postoperatively.  I will see her in the office in 2 weeks for routine postoperative check.

## 2020-08-28 ENCOUNTER — Encounter (HOSPITAL_COMMUNITY): Payer: Self-pay | Admitting: Orthopedic Surgery

## 2021-02-11 ENCOUNTER — Other Ambulatory Visit: Payer: Self-pay | Admitting: Family

## 2021-02-11 ENCOUNTER — Other Ambulatory Visit: Payer: Self-pay

## 2021-02-11 ENCOUNTER — Ambulatory Visit
Admission: RE | Admit: 2021-02-11 | Discharge: 2021-02-11 | Disposition: A | Discharge status: Home / Self Care | Payer: Medicare HMO | Source: Ambulatory Visit | Attending: Family | Admitting: Family

## 2021-02-11 DIAGNOSIS — Z139 Encounter for screening, unspecified: Secondary | ICD-10-CM

## 2021-06-06 DIAGNOSIS — Z299 Encounter for prophylactic measures, unspecified: Secondary | ICD-10-CM | POA: Diagnosis not present

## 2021-06-06 DIAGNOSIS — Z23 Encounter for immunization: Secondary | ICD-10-CM | POA: Diagnosis not present

## 2021-06-06 DIAGNOSIS — E1165 Type 2 diabetes mellitus with hyperglycemia: Secondary | ICD-10-CM | POA: Diagnosis not present

## 2021-06-06 DIAGNOSIS — I10 Essential (primary) hypertension: Secondary | ICD-10-CM | POA: Diagnosis not present

## 2021-06-06 DIAGNOSIS — N390 Urinary tract infection, site not specified: Secondary | ICD-10-CM | POA: Diagnosis not present

## 2021-06-06 DIAGNOSIS — R350 Frequency of micturition: Secondary | ICD-10-CM | POA: Diagnosis not present

## 2021-06-06 DIAGNOSIS — Z6833 Body mass index (BMI) 33.0-33.9, adult: Secondary | ICD-10-CM | POA: Diagnosis not present

## 2021-06-20 DIAGNOSIS — E7800 Pure hypercholesterolemia, unspecified: Secondary | ICD-10-CM | POA: Diagnosis not present

## 2021-06-20 DIAGNOSIS — I10 Essential (primary) hypertension: Secondary | ICD-10-CM | POA: Diagnosis not present

## 2021-06-20 DIAGNOSIS — E1165 Type 2 diabetes mellitus with hyperglycemia: Secondary | ICD-10-CM | POA: Diagnosis not present

## 2021-06-20 DIAGNOSIS — Z299 Encounter for prophylactic measures, unspecified: Secondary | ICD-10-CM | POA: Diagnosis not present

## 2021-06-20 DIAGNOSIS — Z6833 Body mass index (BMI) 33.0-33.9, adult: Secondary | ICD-10-CM | POA: Diagnosis not present

## 2021-09-05 DIAGNOSIS — Z96612 Presence of left artificial shoulder joint: Secondary | ICD-10-CM | POA: Insufficient documentation

## 2021-09-10 DIAGNOSIS — Z96612 Presence of left artificial shoulder joint: Secondary | ICD-10-CM | POA: Diagnosis not present

## 2021-09-18 NOTE — Telephone Encounter (Signed)
Will close encounter

## 2021-11-18 DIAGNOSIS — E7800 Pure hypercholesterolemia, unspecified: Secondary | ICD-10-CM | POA: Diagnosis not present

## 2021-11-18 DIAGNOSIS — Z7189 Other specified counseling: Secondary | ICD-10-CM | POA: Diagnosis not present

## 2021-11-18 DIAGNOSIS — Z0000 Encounter for general adult medical examination without abnormal findings: Secondary | ICD-10-CM | POA: Diagnosis not present

## 2021-11-18 DIAGNOSIS — I10 Essential (primary) hypertension: Secondary | ICD-10-CM | POA: Diagnosis not present

## 2021-11-18 DIAGNOSIS — Z87891 Personal history of nicotine dependence: Secondary | ICD-10-CM | POA: Diagnosis not present

## 2021-11-18 DIAGNOSIS — Z79899 Other long term (current) drug therapy: Secondary | ICD-10-CM | POA: Diagnosis not present

## 2021-11-18 DIAGNOSIS — E559 Vitamin D deficiency, unspecified: Secondary | ICD-10-CM | POA: Diagnosis not present

## 2021-11-18 DIAGNOSIS — Z1331 Encounter for screening for depression: Secondary | ICD-10-CM | POA: Diagnosis not present

## 2021-11-18 DIAGNOSIS — Z6831 Body mass index (BMI) 31.0-31.9, adult: Secondary | ICD-10-CM | POA: Diagnosis not present

## 2021-11-18 DIAGNOSIS — Z299 Encounter for prophylactic measures, unspecified: Secondary | ICD-10-CM | POA: Diagnosis not present

## 2022-01-16 ENCOUNTER — Other Ambulatory Visit: Payer: Self-pay | Admitting: Family

## 2022-01-16 DIAGNOSIS — Z1231 Encounter for screening mammogram for malignant neoplasm of breast: Secondary | ICD-10-CM

## 2022-02-19 ENCOUNTER — Ambulatory Visit
Admission: RE | Admit: 2022-02-19 | Discharge: 2022-02-19 | Disposition: A | Discharge status: Home / Self Care | Payer: Medicare HMO | Source: Ambulatory Visit | Attending: Family | Admitting: Family

## 2022-02-19 DIAGNOSIS — Z1231 Encounter for screening mammogram for malignant neoplasm of breast: Secondary | ICD-10-CM | POA: Diagnosis not present

## 2022-02-20 DIAGNOSIS — I10 Essential (primary) hypertension: Secondary | ICD-10-CM | POA: Diagnosis not present

## 2022-02-20 DIAGNOSIS — M545 Low back pain, unspecified: Secondary | ICD-10-CM | POA: Diagnosis not present

## 2022-02-20 DIAGNOSIS — Z299 Encounter for prophylactic measures, unspecified: Secondary | ICD-10-CM | POA: Diagnosis not present

## 2022-02-20 DIAGNOSIS — N390 Urinary tract infection, site not specified: Secondary | ICD-10-CM | POA: Diagnosis not present

## 2022-04-11 DIAGNOSIS — M545 Low back pain, unspecified: Secondary | ICD-10-CM | POA: Diagnosis not present

## 2022-05-24 DIAGNOSIS — E785 Hyperlipidemia, unspecified: Secondary | ICD-10-CM | POA: Diagnosis not present

## 2022-05-24 DIAGNOSIS — R079 Chest pain, unspecified: Secondary | ICD-10-CM | POA: Diagnosis not present

## 2022-05-24 DIAGNOSIS — E119 Type 2 diabetes mellitus without complications: Secondary | ICD-10-CM | POA: Diagnosis not present

## 2022-05-24 DIAGNOSIS — Z79899 Other long term (current) drug therapy: Secondary | ICD-10-CM | POA: Diagnosis not present

## 2022-05-24 DIAGNOSIS — R0602 Shortness of breath: Secondary | ICD-10-CM | POA: Diagnosis not present

## 2022-05-24 DIAGNOSIS — I10 Essential (primary) hypertension: Secondary | ICD-10-CM | POA: Diagnosis not present

## 2022-05-24 DIAGNOSIS — R0789 Other chest pain: Secondary | ICD-10-CM | POA: Diagnosis not present

## 2022-05-24 DIAGNOSIS — Z7982 Long term (current) use of aspirin: Secondary | ICD-10-CM | POA: Diagnosis not present

## 2022-05-24 DIAGNOSIS — Z7984 Long term (current) use of oral hypoglycemic drugs: Secondary | ICD-10-CM | POA: Diagnosis not present

## 2022-05-24 DIAGNOSIS — R531 Weakness: Secondary | ICD-10-CM | POA: Diagnosis not present

## 2022-05-26 ENCOUNTER — Ambulatory Visit (INDEPENDENT_AMBULATORY_CARE_PROVIDER_SITE_OTHER): Payer: Medicare HMO | Admitting: Family

## 2022-05-26 ENCOUNTER — Encounter: Payer: Self-pay | Admitting: Family

## 2022-05-26 VITALS — BP 180/86 | HR 75 | Temp 97.0°F | Ht 67.0 in | Wt 213.0 lb

## 2022-05-26 DIAGNOSIS — M069 Rheumatoid arthritis, unspecified: Secondary | ICD-10-CM | POA: Diagnosis not present

## 2022-05-26 DIAGNOSIS — E1169 Type 2 diabetes mellitus with other specified complication: Secondary | ICD-10-CM | POA: Diagnosis not present

## 2022-05-26 DIAGNOSIS — I152 Hypertension secondary to endocrine disorders: Secondary | ICD-10-CM

## 2022-05-26 DIAGNOSIS — M8580 Other specified disorders of bone density and structure, unspecified site: Secondary | ICD-10-CM | POA: Insufficient documentation

## 2022-05-26 DIAGNOSIS — E785 Hyperlipidemia, unspecified: Secondary | ICD-10-CM

## 2022-05-26 DIAGNOSIS — E669 Obesity, unspecified: Secondary | ICD-10-CM

## 2022-05-26 DIAGNOSIS — E1159 Type 2 diabetes mellitus with other circulatory complications: Secondary | ICD-10-CM

## 2022-05-26 DIAGNOSIS — Z0001 Encounter for general adult medical examination with abnormal findings: Secondary | ICD-10-CM

## 2022-05-26 DIAGNOSIS — M159 Polyosteoarthritis, unspecified: Secondary | ICD-10-CM

## 2022-05-26 DIAGNOSIS — Z0000 Encounter for general adult medical examination without abnormal findings: Secondary | ICD-10-CM

## 2022-05-26 LAB — BAYER DCA HB A1C WAIVED: HB A1C (BAYER DCA - WAIVED): 6.4 % — ABNORMAL HIGH (ref 4.8–5.6)

## 2022-05-26 MED ORDER — AMLODIPINE BESYLATE 10 MG PO TABS: 10.0000 mg | ORAL_TABLET | Freq: Every day | ORAL | 1 refills | Status: DC

## 2022-05-26 NOTE — Patient Instructions (Signed)
Hypertension, Adult High blood pressure (hypertension) is when the force of blood pumping through the arteries is too strong. The arteries are the blood vessels that carry blood from the heart throughout the body. Hypertension forces the heart to work harder to pump blood and may cause arteries to become narrow or stiff. Untreated or uncontrolled hypertension can lead to a heart attack, heart failure, a stroke, kidney disease, and other problems. A blood pressure reading consists of a higher number over a lower number. Ideally, your blood pressure should be below 120/80. The first ("top") number is called the systolic pressure. It is a measure of the pressure in your arteries as your heart beats. The second ("bottom") number is called the diastolic pressure. It is a measure of the pressure in your arteries as the heart relaxes. What are the causes? The exact cause of this condition is not known. There are some conditions that result in high blood pressure. What increases the risk? Certain factors may make you more likely to develop high blood pressure. Some of these risk factors are under your control, including: Smoking. Not getting enough exercise or physical activity. Being overweight. Having too much fat, sugar, calories, or salt (sodium) in your diet. Drinking too much alcohol. Other risk factors include: Having a personal history of heart disease, diabetes, high cholesterol, or kidney disease. Stress. Having a family history of high blood pressure and high cholesterol. Having obstructive sleep apnea. Age. The risk increases with age. What are the signs or symptoms? High blood pressure may not cause symptoms. Very high blood pressure (hypertensive crisis) may cause: Headache. Fast or irregular heartbeats (palpitations). Shortness of breath. Nosebleed. Nausea and vomiting. Vision changes. Severe chest pain, dizziness, and seizures. How is this diagnosed? This condition is diagnosed by  measuring your blood pressure while you are seated, with your arm resting on a flat surface, your legs uncrossed, and your feet flat on the floor. The cuff of the blood pressure monitor will be placed directly against the skin of your upper arm at the level of your heart. Blood pressure should be measured at least twice using the same arm. Certain conditions can cause a difference in blood pressure between your right and left arms. If you have a high blood pressure reading during one visit or you have normal blood pressure with other risk factors, you may be asked to: Return on a different day to have your blood pressure checked again. Monitor your blood pressure at home for 1 week or longer. If you are diagnosed with hypertension, you may have other blood or imaging tests to help your health care provider understand your overall risk for other conditions. How is this treated? This condition is treated by making healthy lifestyle changes, such as eating healthy foods, exercising more, and reducing your alcohol intake. You may be referred for counseling on a healthy diet and physical activity. Your health care provider may prescribe medicine if lifestyle changes are not enough to get your blood pressure under control and if: Your systolic blood pressure is above 130. Your diastolic blood pressure is above 80. Your personal target blood pressure may vary depending on your medical conditions, your age, and other factors. Follow these instructions at home: Eating and drinking  Eat a diet that is high in fiber and potassium, and low in sodium, added sugar, and fat. An example of this eating plan is called the DASH diet. DASH stands for Dietary Approaches to Stop Hypertension. To eat this way: Eat   plenty of fresh fruits and vegetables. Try to fill one half of your plate at each meal with fruits and vegetables. Eat whole grains, such as whole-wheat pasta, brown rice, or whole-grain bread. Fill about one  fourth of your plate with whole grains. Eat or drink low-fat dairy products, such as skim milk or low-fat yogurt. Avoid fatty cuts of meat, processed or cured meats, and poultry with skin. Fill about one fourth of your plate with lean proteins, such as fish, chicken without skin, beans, eggs, or tofu. Avoid pre-made and processed foods. These tend to be higher in sodium, added sugar, and fat. Reduce your daily sodium intake. Many people with hypertension should eat less than 1,500 mg of sodium a day. Do not drink alcohol if: Your health care provider tells you not to drink. You are pregnant, may be pregnant, or are planning to become pregnant. If you drink alcohol: Limit how much you have to: 0-1 drink a day for women. 0-2 drinks a day for men. Know how much alcohol is in your drink. In the [place] one drink equals one 12 oz bottle of beer (355 mL), one 5 oz glass of wine (148 mL), or one 1 oz glass of hard liquor (44 mL). Lifestyle  Work with your health care provider to maintain a healthy body weight or to lose weight. Ask what an ideal weight is for you. Get at least 30 minutes of exercise that causes your heart to beat faster (aerobic exercise) most days of the week. Activities may include walking, swimming, or biking. Include exercise to strengthen your muscles (resistance exercise), such as Pilates or lifting weights, as part of your weekly exercise routine. Try to do these types of exercises for 30 minutes at least 3 days a week. Do not use any products that contain nicotine or tobacco. These products include cigarettes, chewing tobacco, and vaping devices, such as e-cigarettes. If you need help quitting, ask your health care provider. Monitor your blood pressure at home as told by your health care provider. Keep all follow-up visits. This is important. Medicines Take over-the-counter and prescription medicines only as told by your health care provider. Follow directions carefully. Blood  pressure medicines must be taken as prescribed. Do not skip doses of blood pressure medicine. Doing this puts you at risk for problems and can make the medicine less effective. Ask your health care provider about side effects or reactions to medicines that you should watch for. Contact a health care provider if you: Think you are having a reaction to a medicine you are taking. Have headaches that keep coming back (recurring). Feel dizzy. Have swelling in your ankles. Have trouble with your vision. Get help right away if you: Develop a severe headache or confusion. Have unusual weakness or numbness. Feel faint. Have severe pain in your chest or abdomen. Vomit repeatedly. Have trouble breathing. These symptoms may be an emergency. Get help right away. Call 911. Do not wait to see if the symptoms will go away. Do not drive yourself to the hospital. Summary Hypertension is when the force of blood pumping through your arteries is too strong. If this condition is not controlled, it may put you at risk for serious complications. Your personal target blood pressure may vary depending on your medical conditions, your age, and other factors. For most people, a normal blood pressure is less than 120/80. Hypertension is treated with lifestyle changes, medicines, or a combination of both. Lifestyle changes include losing weight, eating a healthy,   low-sodium diet, exercising more, and limiting alcohol. This information is not intended to replace advice given to you by your health care provider. Make sure you discuss any questions you have with your health care provider. Document Revised: 07/02/2021 Document Reviewed: 07/02/2021 Elsevier Patient Education  2023 Elsevier Inc.  

## 2022-05-26 NOTE — Progress Notes (Signed)
Subjective:    Patient ID: Colleen Duran, female    DOB: 1946/08/05, 76 y.o.   MRN: [id number]  Chief Complaint  Patient presents with   New Patient (Initial Visit)    Wants nitroglycerine    PT presents to the office today re-establish care. She has not been seen in our office since 08/30/19.  Hypertension This is a chronic problem. The current episode started more than 1 year ago. The problem has been waxing and waning since onset. The problem is uncontrolled. Associated symptoms include peripheral edema and shortness of breath. Pertinent negatives include no blurred vision or malaise/fatigue. Risk factors for coronary artery disease include dyslipidemia, diabetes mellitus, obesity and sedentary lifestyle. The current treatment provides moderate improvement.  Hyperlipidemia This is a chronic problem. The current episode started more than 1 year ago. The problem is controlled. Exacerbating diseases include obesity. Associated symptoms include shortness of breath. Current antihyperlipidemic treatment includes ezetimibe. The current treatment provides moderate improvement of lipids. Risk factors for coronary artery disease include diabetes mellitus, dyslipidemia, hypertension, a sedentary lifestyle and post-menopausal.  Diabetes She presents for her follow-up diabetic visit. She has type 2 diabetes mellitus. Pertinent negatives for diabetes include no blurred vision and no foot paresthesias. Symptoms are stable. Risk factors for coronary artery disease include dyslipidemia, diabetes mellitus, hypertension, sedentary lifestyle and post-menopausal. She is following a generally healthy diet. Her overall blood glucose range is 130-140 mg/dl.  Arthritis Presents for follow-up visit. She complains of pain and stiffness. The symptoms have been stable. Affected location: back. Her pain is at a severity of 0/10.      Review of Systems  Constitutional:  Negative for malaise/fatigue.  Eyes:  Negative  for blurred vision.  Respiratory:  Positive for shortness of breath.   Musculoskeletal:  Positive for arthritis and stiffness.  All other systems reviewed and are negative.  Family History  Problem Relation Age of Onset   Hypertension Mother    Stroke Mother    Cancer Sister        lung   Healthy Daughter    Healthy Daughter    Cancer Brother    Mental retardation Brother    Breast cancer Neg Hx    Social History   Socioeconomic History   Marital status: Widowed    Spouse name: Not on file   Number of children: 2   Years of education: 71   Highest education level: 12th grade  Occupational History   Occupation: Retired    Comment: Retail buyer  Tobacco Use   Smoking status: Never   Smokeless tobacco: Never  Vaping Use   Vaping Use: Never used  Substance and Sexual Activity   Alcohol use: No   Drug use: No   Sexual activity: Not Currently    Birth control/protection: Surgical  Other Topics Concern   Not on file  Social History Narrative   Ms Leonhart lives with her daughter and grandchildren.    Social Determinants of Health   Financial Resource Strain: Low Risk  (12/30/2017)   Overall Financial Resource Strain (CARDIA)    Difficulty of Paying Living Expenses: Not hard at all  Food Insecurity: No Food Insecurity (12/30/2017)   Hunger Vital Sign    Worried About Running Out of Food in the Last Year: Never true    Ran Out of Food in the Last Year: Never true  Transportation Needs: No Transportation Needs (12/30/2017)   PRAPARE - Administrator, Civil Service (Medical): No  Lack of Transportation (Non-Medical): No  Physical Activity: Insufficiently Active (04/22/2019)   Exercise Vital Sign    Days of Exercise per Week: 2 days    Minutes of Exercise per Session: 20 min  Stress: No Stress Concern Present (12/30/2017)   [place]    Feeling of Stress : Not at all  Social  Connections: Unknown (04/22/2019)   Social Connection and Isolation Panel [NHANES]    Frequency of Communication with Friends and Family: Not on file    Frequency of Social Gatherings with Friends and Family: Not on file    Attends Religious Services: Not on file    Active Member of Clubs or Organizations: Not on file    Attends Archivist Meetings: 1 to 4 times per year    Marital Status: Not on file       Objective:   Physical Exam Vitals reviewed.  Constitutional:      General: She is not in acute distress.    Appearance: She is well-developed. She is obese.  HENT:     Head: Normocephalic and atraumatic.     Right Ear: Tympanic membrane normal.     Left Ear: Tympanic membrane normal.  Eyes:     Pupils: Pupils are equal, round, and reactive to light.  Neck:     Thyroid: No thyromegaly.  Cardiovascular:     Rate and Rhythm: Normal rate and regular rhythm.     Heart sounds: Normal heart sounds. No murmur heard. Pulmonary:     Effort: Pulmonary effort is normal. No respiratory distress.     Breath sounds: Normal breath sounds. No wheezing.  Abdominal:     General: Bowel sounds are normal. There is no distension.     Palpations: Abdomen is soft.     Tenderness: There is no abdominal tenderness.  Musculoskeletal:        General: No tenderness. Normal range of motion.     Cervical back: Normal range of motion and neck supple.  Skin:    General: Skin is warm and dry.  Neurological:     Mental Status: She is alert and oriented to person, place, and time.     Cranial Nerves: No cranial nerve deficit.     Deep Tendon Reflexes: Reflexes are normal and symmetric.  Psychiatric:        Behavior: Behavior normal.        Thought Content: Thought content normal.        Judgment: Judgment normal.     Diabetic Foot Exam - Simple   Simple Foot Form Diabetic Foot exam was performed with the following findings: Yes 05/26/2022 11:08 AM  Visual Inspection No deformities, no  ulcerations, no other skin breakdown bilaterally: Yes Sensation Testing See comments: Yes Pulse Check Posterior Tibialis and Dorsalis pulse intact bilaterally: Yes Comments Negative monofilament in great toe       BP (!) 180/86   Pulse 75   Temp (!) 97 F (36.1 C) (Temporal)   Ht $R'5\' 7"'fV$  (1.702 m)   Wt 213 lb (96.6 kg)   SpO2 97%   BMI 33.36 kg/m      Assessment & Plan:  TERRESSA EVOLA comes in today with chief complaint of New Patient (Initial Visit) (Wants nitroglycerine )   Diagnosis and orders addressed:  1. Annual physical exam - CMP14+EGFR - CBC with Differential/Platelet - Lipid panel - TSH  2. Type 2 diabetes mellitus with other specified complication, without  long-term current use of insulin (HCC) - CMP14+EGFR - CBC with Differential/Platelet - Bayer DCA Hb A1c Waived - Microalbumin / creatinine urine ratio  3. Hyperlipidemia associated with type 2 diabetes mellitus ([place]) - CMP14+EGFR - CBC with Differential/Platelet - Lipid panel  4. Hypertension associated with diabetes ([place]) -Amlodipine increased to 10 mg from 5 mg  - CMP14+EGFR - CBC with Differential/Platelet - amLODipine (NORVASC) 10 MG tablet; Take 1 tablet (10 mg total) by mouth daily.  Dispense: 90 tablet; Refill: 1  5. Obesity (BMI 30-39.9)  - CMP14+EGFR - CBC with Differential/Platelet  6. Primary osteoarthritis involving multiple joints - CMP14+EGFR - CBC with Differential/Platelet  7. Osteopenia, unspecified location   Labs pending Health Maintenance reviewed Diet and exercise encouraged  Follow up plan: 2 weeks to recheck HTN   Evelina Dun, FNP

## 2022-05-27 LAB — CMP14+EGFR
ALT: 14 IU/L (ref 0–32)
AST: 19 IU/L (ref 0–40)
Albumin/Globulin Ratio: 1.5 (ref 1.2–2.2)
Albumin: 4.5 g/dL (ref 3.8–4.8)
Alkaline Phosphatase: 75 IU/L (ref 44–121)
BUN/Creatinine Ratio: 19 (ref 12–28)
BUN: 17 mg/dL (ref 8–27)
Bilirubin Total: 0.3 mg/dL (ref 0.0–1.2)
CO2: 21 mmol/L (ref 20–29)
Calcium: 9.6 mg/dL (ref 8.7–10.3)
Chloride: 105 mmol/L (ref 96–106)
Creatinine, Ser: 0.88 mg/dL (ref 0.57–1.00)
Globulin, Total: 3 g/dL (ref 1.5–4.5)
Glucose: 100 mg/dL — ABNORMAL HIGH (ref 70–99)
Potassium: 4.3 mmol/L (ref 3.5–5.2)
Sodium: 143 mmol/L (ref 134–144)
Total Protein: 7.5 g/dL (ref 6.0–8.5)
eGFR: 68 mL/min/{1.73_m2} (ref >59)

## 2022-05-27 LAB — CBC WITH DIFFERENTIAL/PLATELET
Basophils Absolute: 0 10*3/uL (ref 0.0–0.2)
Basos: 1 %
EOS (ABSOLUTE): 0.2 10*3/uL (ref 0.0–0.4)
Eos: 5 %
Hematocrit: 38.2 % (ref 34.0–46.6)
Hemoglobin: 13 g/dL (ref 11.1–15.9)
Immature Grans (Abs): 0 10*3/uL (ref 0.0–0.1)
Immature Granulocytes: 0 %
Lymphocytes Absolute: 1.6 10*3/uL (ref 0.7–3.1)
Lymphs: 36 %
MCH: 29.7 pg (ref 26.6–33.0)
MCHC: 34 g/dL (ref 31.5–35.7)
MCV: 87 fL (ref 79–97)
Monocytes Absolute: 0.5 10*3/uL (ref 0.1–0.9)
Monocytes: 11 %
Neutrophils Absolute: 2.1 10*3/uL (ref 1.4–7.0)
Neutrophils: 47 %
Platelets: 253 10*3/uL (ref 150–450)
RBC: 4.37 x10E6/uL (ref 3.77–5.28)
RDW: 13.6 % (ref 11.7–15.4)
WBC: 4.4 10*3/uL (ref 3.4–10.8)

## 2022-05-27 LAB — LIPID PANEL
Chol/HDL Ratio: 3.7 ratio (ref 0.0–4.4)
Cholesterol, Total: 204 mg/dL — ABNORMAL HIGH (ref 100–199)
HDL: 55 mg/dL (ref >39)
LDL Chol Calc (NIH): 118 mg/dL — ABNORMAL HIGH (ref 0–99)
Triglycerides: 177 mg/dL — ABNORMAL HIGH (ref 0–149)
VLDL Cholesterol Cal: 31 mg/dL (ref 5–40)

## 2022-05-27 LAB — MICROALBUMIN / CREATININE URINE RATIO
Creatinine, Urine: 18.3 mg/dL
Microalb/Creat Ratio: 63 mg/g creat — ABNORMAL HIGH (ref 0–29)
Microalbumin, Urine: 11.6 ug/mL

## 2022-05-27 LAB — TSH: TSH: 3.84 u[IU]/mL (ref 0.450–4.500)

## 2022-06-12 ENCOUNTER — Encounter: Payer: Self-pay | Admitting: Family

## 2022-06-12 ENCOUNTER — Ambulatory Visit (INDEPENDENT_AMBULATORY_CARE_PROVIDER_SITE_OTHER): Payer: Medicare HMO | Admitting: Family

## 2022-06-12 VITALS — BP 163/89 | HR 69 | Temp 97.1°F | Ht 67.0 in | Wt 209.4 lb

## 2022-06-12 DIAGNOSIS — E1169 Type 2 diabetes mellitus with other specified complication: Secondary | ICD-10-CM | POA: Diagnosis not present

## 2022-06-12 DIAGNOSIS — E1159 Type 2 diabetes mellitus with other circulatory complications: Secondary | ICD-10-CM

## 2022-06-12 DIAGNOSIS — I152 Hypertension secondary to endocrine disorders: Secondary | ICD-10-CM

## 2022-06-12 MED ORDER — SPIRONOLACTONE 25 MG PO TABS: 12.5000 mg | ORAL_TABLET | Freq: Every day | ORAL | 1 refills | Status: DC

## 2022-06-12 NOTE — Patient Instructions (Signed)
Hypertension, Adult High blood pressure (hypertension) is when the force of blood pumping through the arteries is too strong. The arteries are the blood vessels that carry blood from the heart throughout the body. Hypertension forces the heart to work harder to pump blood and may cause arteries to become narrow or stiff. Untreated or uncontrolled hypertension can lead to a heart attack, heart failure, a stroke, kidney disease, and other problems. A blood pressure reading consists of a higher number over a lower number. Ideally, your blood pressure should be below 120/80. The first ("top") number is called the systolic pressure. It is a measure of the pressure in your arteries as your heart beats. The second ("bottom") number is called the diastolic pressure. It is a measure of the pressure in your arteries as the heart relaxes. What are the causes? The exact cause of this condition is not known. There are some conditions that result in high blood pressure. What increases the risk? Certain factors may make you more likely to develop high blood pressure. Some of these risk factors are under your control, including: Smoking. Not getting enough exercise or physical activity. Being overweight. Having too much fat, sugar, calories, or salt (sodium) in your diet. Drinking too much alcohol. Other risk factors include: Having a personal history of heart disease, diabetes, high cholesterol, or kidney disease. Stress. Having a family history of high blood pressure and high cholesterol. Having obstructive sleep apnea. Age. The risk increases with age. What are the signs or symptoms? High blood pressure may not cause symptoms. Very high blood pressure (hypertensive crisis) may cause: Headache. Fast or irregular heartbeats (palpitations). Shortness of breath. Nosebleed. Nausea and vomiting. Vision changes. Severe chest pain, dizziness, and seizures. How is this diagnosed? This condition is diagnosed by  measuring your blood pressure while you are seated, with your arm resting on a flat surface, your legs uncrossed, and your feet flat on the floor. The cuff of the blood pressure monitor will be placed directly against the skin of your upper arm at the level of your heart. Blood pressure should be measured at least twice using the same arm. Certain conditions can cause a difference in blood pressure between your right and left arms. If you have a high blood pressure reading during one visit or you have normal blood pressure with other risk factors, you may be asked to: Return on a different day to have your blood pressure checked again. Monitor your blood pressure at home for 1 week or longer. If you are diagnosed with hypertension, you may have other blood or imaging tests to help your health care provider understand your overall risk for other conditions. How is this treated? This condition is treated by making healthy lifestyle changes, such as eating healthy foods, exercising more, and reducing your alcohol intake. You may be referred for counseling on a healthy diet and physical activity. Your health care provider may prescribe medicine if lifestyle changes are not enough to get your blood pressure under control and if: Your systolic blood pressure is above 130. Your diastolic blood pressure is above 80. Your personal target blood pressure may vary depending on your medical conditions, your age, and other factors. Follow these instructions at home: Eating and drinking  Eat a diet that is high in fiber and potassium, and low in sodium, added sugar, and fat. An example of this eating plan is called the DASH diet. DASH stands for Dietary Approaches to Stop Hypertension. To eat this way: Eat   plenty of fresh fruits and vegetables. Try to fill one half of your plate at each meal with fruits and vegetables. Eat whole grains, such as whole-wheat pasta, brown rice, or whole-grain bread. Fill about one  fourth of your plate with whole grains. Eat or drink low-fat dairy products, such as skim milk or low-fat yogurt. Avoid fatty cuts of meat, processed or cured meats, and poultry with skin. Fill about one fourth of your plate with lean proteins, such as fish, chicken without skin, beans, eggs, or tofu. Avoid pre-made and processed foods. These tend to be higher in sodium, added sugar, and fat. Reduce your daily sodium intake. Many people with hypertension should eat less than 1,500 mg of sodium a day. Do not drink alcohol if: Your health care provider tells you not to drink. You are pregnant, may be pregnant, or are planning to become pregnant. If you drink alcohol: Limit how much you have to: 0-1 drink a day for women. 0-2 drinks a day for men. Know how much alcohol is in your drink. In the [place] one drink equals one 12 oz bottle of beer (355 mL), one 5 oz glass of wine (148 mL), or one 1 oz glass of hard liquor (44 mL). Lifestyle  Work with your health care provider to maintain a healthy body weight or to lose weight. Ask what an ideal weight is for you. Get at least 30 minutes of exercise that causes your heart to beat faster (aerobic exercise) most days of the week. Activities may include walking, swimming, or biking. Include exercise to strengthen your muscles (resistance exercise), such as Pilates or lifting weights, as part of your weekly exercise routine. Try to do these types of exercises for 30 minutes at least 3 days a week. Do not use any products that contain nicotine or tobacco. These products include cigarettes, chewing tobacco, and vaping devices, such as e-cigarettes. If you need help quitting, ask your health care provider. Monitor your blood pressure at home as told by your health care provider. Keep all follow-up visits. This is important. Medicines Take over-the-counter and prescription medicines only as told by your health care provider. Follow directions carefully. Blood  pressure medicines must be taken as prescribed. Do not skip doses of blood pressure medicine. Doing this puts you at risk for problems and can make the medicine less effective. Ask your health care provider about side effects or reactions to medicines that you should watch for. Contact a health care provider if you: Think you are having a reaction to a medicine you are taking. Have headaches that keep coming back (recurring). Feel dizzy. Have swelling in your ankles. Have trouble with your vision. Get help right away if you: Develop a severe headache or confusion. Have unusual weakness or numbness. Feel faint. Have severe pain in your chest or abdomen. Vomit repeatedly. Have trouble breathing. These symptoms may be an emergency. Get help right away. Call 911. Do not wait to see if the symptoms will go away. Do not drive yourself to the hospital. Summary Hypertension is when the force of blood pumping through your arteries is too strong. If this condition is not controlled, it may put you at risk for serious complications. Your personal target blood pressure may vary depending on your medical conditions, your age, and other factors. For most people, a normal blood pressure is less than 120/80. Hypertension is treated with lifestyle changes, medicines, or a combination of both. Lifestyle changes include losing weight, eating a healthy,   low-sodium diet, exercising more, and limiting alcohol. This information is not intended to replace advice given to you by your health care provider. Make sure you discuss any questions you have with your health care provider. Document Revised: 07/02/2021 Document Reviewed: 07/02/2021 Elsevier Patient Education  2023 Elsevier Inc.  

## 2022-06-12 NOTE — Progress Notes (Signed)
Subjective:    Patient ID: Colleen Duran, female    DOB: 19-Apr-1946, 76 y.o.   MRN: [id number]  Chief Complaint  Patient presents with   Hypertension   Pt presents to the office today to recheck HTN. She was seen on 05/26/22 and we increased her Norvasc to 10 mg from 5 mg. Hypertension This is a chronic problem. The current episode started more than 1 year ago. The problem has been waxing and waning since onset. The problem is uncontrolled. Associated symptoms include malaise/fatigue, peripheral edema ("slightly") and shortness of breath. Pertinent negatives include no blurred vision. Risk factors for coronary artery disease include dyslipidemia, obesity and sedentary lifestyle. Past treatments include calcium channel blockers. The current treatment provides mild improvement.  Diabetes She presents for her follow-up diabetic visit. She has type 2 diabetes mellitus. Pertinent negatives for diabetes include no blurred vision and no foot paresthesias. Symptoms are stable. Risk factors for coronary artery disease include dyslipidemia, diabetes mellitus, hypertension, sedentary lifestyle and post-menopausal. She is following a generally healthy diet. Eye exam is current.      Review of Systems  Constitutional:  Positive for malaise/fatigue.  Eyes:  Negative for blurred vision.  Respiratory:  Positive for shortness of breath.   All other systems reviewed and are negative.      Objective:   Physical Exam Vitals reviewed.  Constitutional:      General: She is not in acute distress.    Appearance: She is well-developed. She is obese.  HENT:     Head: Normocephalic and atraumatic.     Right Ear: Tympanic membrane normal.     Left Ear: Tympanic membrane normal.  Eyes:     Pupils: Pupils are equal, round, and reactive to light.  Neck:     Thyroid: No thyromegaly.  Cardiovascular:     Rate and Rhythm: Normal rate and regular rhythm.     Heart sounds: Normal heart sounds. No murmur  heard. Pulmonary:     Effort: Pulmonary effort is normal. No respiratory distress.     Breath sounds: Normal breath sounds. No wheezing.  Abdominal:     General: Bowel sounds are normal. There is no distension.     Palpations: Abdomen is soft.     Tenderness: There is no abdominal tenderness.  Musculoskeletal:        General: No tenderness. Normal range of motion.     Cervical back: Normal range of motion and neck supple.  Skin:    General: Skin is warm and dry.  Neurological:     Mental Status: She is alert and oriented to person, place, and time.     Cranial Nerves: No cranial nerve deficit.     Deep Tendon Reflexes: Reflexes are normal and symmetric.  Psychiatric:        Behavior: Behavior normal.        Thought Content: Thought content normal.        Judgment: Judgment normal.          BP (!) 163/89   Pulse 69   Temp (!) 97.1 F (36.2 C) (Temporal)   Ht $R'5\' 7"'Ov$  (1.702 m)   Wt 209 lb 6.4 oz (95 kg)   SpO2 99%   BMI 32.80 kg/m   Assessment & Plan:  Colleen Duran comes in today with chief complaint of Hypertension   Diagnosis and orders addressed:  1. Hypertension associated with diabetes ([place]) Will start Spironolactone 12.5 mg -Dash diet information given -Exercise encouraged -  Stress Management  -Continue current meds -RTO in 2-4 weeks  - spironolactone (ALDACTONE) 25 MG tablet; Take 0.5 tablets (12.5 mg total) by mouth daily.  Dispense: 45 tablet; Refill: 1 - BMP8+EGFR  2. Type 2 diabetes mellitus with other specified complication, without long-term current use of insulin ([place]) - BMP8+EGFR   Labs pending Health Maintenance reviewed Diet and exercise encouraged  Follow up plan: 2 weeks to recheck HTN   Evelina Dun, FNP

## 2022-06-18 DIAGNOSIS — Z7984 Long term (current) use of oral hypoglycemic drugs: Secondary | ICD-10-CM | POA: Diagnosis not present

## 2022-06-18 DIAGNOSIS — Z961 Presence of intraocular lens: Secondary | ICD-10-CM | POA: Diagnosis not present

## 2022-06-18 DIAGNOSIS — E119 Type 2 diabetes mellitus without complications: Secondary | ICD-10-CM | POA: Diagnosis not present

## 2022-06-18 LAB — HM DIABETES EYE EXAM

## 2022-07-10 ENCOUNTER — Ambulatory Visit: Payer: Medicare HMO | Admitting: Family

## 2022-07-21 ENCOUNTER — Ambulatory Visit (INDEPENDENT_AMBULATORY_CARE_PROVIDER_SITE_OTHER): Payer: Medicare HMO | Admitting: Family

## 2022-07-21 ENCOUNTER — Encounter: Payer: Self-pay | Admitting: Family

## 2022-07-21 VITALS — BP 134/82 | HR 88 | Temp 97.4°F | Ht <= 58 in | Wt 209.4 lb

## 2022-07-21 DIAGNOSIS — E1169 Type 2 diabetes mellitus with other specified complication: Secondary | ICD-10-CM

## 2022-07-21 DIAGNOSIS — E785 Hyperlipidemia, unspecified: Secondary | ICD-10-CM | POA: Diagnosis not present

## 2022-07-21 DIAGNOSIS — E669 Obesity, unspecified: Secondary | ICD-10-CM | POA: Diagnosis not present

## 2022-07-21 DIAGNOSIS — M159 Polyosteoarthritis, unspecified: Secondary | ICD-10-CM

## 2022-07-21 DIAGNOSIS — I152 Hypertension secondary to endocrine disorders: Secondary | ICD-10-CM

## 2022-07-21 DIAGNOSIS — E1159 Type 2 diabetes mellitus with other circulatory complications: Secondary | ICD-10-CM | POA: Diagnosis not present

## 2022-07-21 NOTE — Progress Notes (Signed)
Subjective:    Patient ID: Colleen Duran, female    DOB: 09-18-1945, 76 y.o.   MRN: [id number]  Chief Complaint  Patient presents with   Hypertension   Pt presents to the office today to recheck HTN. She was seen on 05/26/22 and we increased her Norvasc to 10 mg from 5 mg, then added Spironolactone 12.5 mg daily on 06/12/22. Hypertension This is a chronic problem. The current episode started more than 1 year ago. The problem has been resolved since onset. The problem is controlled. Pertinent negatives include no blurred vision, malaise/fatigue, peripheral edema or shortness of breath. Risk factors for coronary artery disease include dyslipidemia and obesity. The current treatment provides moderate improvement.  Diabetes She presents for her follow-up diabetic visit. She has type 2 diabetes mellitus. Pertinent negatives for diabetes include no blurred vision and no foot paresthesias. Risk factors for coronary artery disease include dyslipidemia, diabetes mellitus, hypertension and sedentary lifestyle. She is following a generally unhealthy diet. Her overall blood glucose range is 180-200 mg/dl.  Arthritis Presents for follow-up visit. She complains of pain and stiffness. The symptoms have been improving. Affected locations include the left knee and right knee. Her pain is at a severity of 0/10 (with tyelnol).  Hyperlipidemia This is a chronic problem. The current episode started more than 1 year ago. Exacerbating diseases include obesity. Pertinent negatives include no shortness of breath. The current treatment provides moderate improvement of lipids. Risk factors for coronary artery disease include diabetes mellitus, hypertension, a sedentary lifestyle and dyslipidemia.      Review of Systems  Constitutional:  Negative for malaise/fatigue.  Eyes:  Negative for blurred vision.  Respiratory:  Negative for shortness of breath.   Musculoskeletal:  Positive for arthritis and stiffness.  All  other systems reviewed and are negative.      Objective:   Physical Exam Vitals reviewed.  Constitutional:      General: She is not in acute distress.    Appearance: She is well-developed. She is obese.  HENT:     Head: Normocephalic and atraumatic.     Right Ear: Tympanic membrane normal.     Left Ear: Tympanic membrane normal.  Eyes:     Pupils: Pupils are equal, round, and reactive to light.  Neck:     Thyroid: No thyromegaly.  Cardiovascular:     Rate and Rhythm: Normal rate and regular rhythm.     Heart sounds: Normal heart sounds. No murmur heard. Pulmonary:     Effort: Pulmonary effort is normal. No respiratory distress.     Breath sounds: Normal breath sounds. No wheezing.  Abdominal:     General: Bowel sounds are normal. There is no distension.     Palpations: Abdomen is soft.     Tenderness: There is no abdominal tenderness.  Musculoskeletal:        General: No tenderness. Normal range of motion.     Cervical back: Normal range of motion and neck supple.  Skin:    General: Skin is warm and dry.  Neurological:     Mental Status: She is alert and oriented to person, place, and time.     Cranial Nerves: No cranial nerve deficit.     Deep Tendon Reflexes: Reflexes are normal and symmetric.  Psychiatric:        Behavior: Behavior normal.        Thought Content: Thought content normal.        Judgment: Judgment normal.  BP 134/82   Pulse 88   Temp (!) 97.4 F (36.3 C) (Temporal)   Ht 2' (0.61 m)   Wt 209 lb 6.4 oz (95 kg)   HC 67" (170.2 cm)   BMI 255.60 kg/m      Assessment & Plan:   Colleen Duran comes in today with chief complaint of Hypertension   Diagnosis and orders addressed:  1. Type 2 diabetes mellitus with other specified complication, without long-term current use of insulin (HCC) - CMP14+EGFR  2. Hypertension associated with diabetes ([place]) - CMP14+EGFR  3. Hyperlipidemia associated with type 2 diabetes mellitus (HCC) -  CMP14+EGFR  4. Obesity (BMI 30-39.9) - CMP14+EGFR  5. Primary osteoarthritis involving multiple joints - CMP14+EGFR   Labs pending Health Maintenance reviewed Diet and exercise encouraged  Follow up plan: 3 months    Colleen Dun, FNP

## 2022-07-21 NOTE — Patient Instructions (Signed)
Hypertension, Adult High blood pressure (hypertension) is when the force of blood pumping through the arteries is too strong. The arteries are the blood vessels that carry blood from the heart throughout the body. Hypertension forces the heart to work harder to pump blood and may cause arteries to become narrow or stiff. Untreated or uncontrolled hypertension can lead to a heart attack, heart failure, a stroke, kidney disease, and other problems. A blood pressure reading consists of a higher number over a lower number. Ideally, your blood pressure should be below 120/80. The first ("top") number is called the systolic pressure. It is a measure of the pressure in your arteries as your heart beats. The second ("bottom") number is called the diastolic pressure. It is a measure of the pressure in your arteries as the heart relaxes. What are the causes? The exact cause of this condition is not known. There are some conditions that result in high blood pressure. What increases the risk? Certain factors may make you more likely to develop high blood pressure. Some of these risk factors are under your control, including: Smoking. Not getting enough exercise or physical activity. Being overweight. Having too much fat, sugar, calories, or salt (sodium) in your diet. Drinking too much alcohol. Other risk factors include: Having a personal history of heart disease, diabetes, high cholesterol, or kidney disease. Stress. Having a family history of high blood pressure and high cholesterol. Having obstructive sleep apnea. Age. The risk increases with age. What are the signs or symptoms? High blood pressure may not cause symptoms. Very high blood pressure (hypertensive crisis) may cause: Headache. Fast or irregular heartbeats (palpitations). Shortness of breath. Nosebleed. Nausea and vomiting. Vision changes. Severe chest pain, dizziness, and seizures. How is this diagnosed? This condition is diagnosed by  measuring your blood pressure while you are seated, with your arm resting on a flat surface, your legs uncrossed, and your feet flat on the floor. The cuff of the blood pressure monitor will be placed directly against the skin of your upper arm at the level of your heart. Blood pressure should be measured at least twice using the same arm. Certain conditions can cause a difference in blood pressure between your right and left arms. If you have a high blood pressure reading during one visit or you have normal blood pressure with other risk factors, you may be asked to: Return on a different day to have your blood pressure checked again. Monitor your blood pressure at home for 1 week or longer. If you are diagnosed with hypertension, you may have other blood or imaging tests to help your health care provider understand your overall risk for other conditions. How is this treated? This condition is treated by making healthy lifestyle changes, such as eating healthy foods, exercising more, and reducing your alcohol intake. You may be referred for counseling on a healthy diet and physical activity. Your health care provider may prescribe medicine if lifestyle changes are not enough to get your blood pressure under control and if: Your systolic blood pressure is above 130. Your diastolic blood pressure is above 80. Your personal target blood pressure may vary depending on your medical conditions, your age, and other factors. Follow these instructions at home: Eating and drinking  Eat a diet that is high in fiber and potassium, and low in sodium, added sugar, and fat. An example of this eating plan is called the DASH diet. DASH stands for Dietary Approaches to Stop Hypertension. To eat this way: Eat   plenty of fresh fruits and vegetables. Try to fill one half of your plate at each meal with fruits and vegetables. Eat whole grains, such as whole-wheat pasta, brown rice, or whole-grain bread. Fill about one  fourth of your plate with whole grains. Eat or drink low-fat dairy products, such as skim milk or low-fat yogurt. Avoid fatty cuts of meat, processed or cured meats, and poultry with skin. Fill about one fourth of your plate with lean proteins, such as fish, chicken without skin, beans, eggs, or tofu. Avoid pre-made and processed foods. These tend to be higher in sodium, added sugar, and fat. Reduce your daily sodium intake. Many people with hypertension should eat less than 1,500 mg of sodium a day. Do not drink alcohol if: Your health care provider tells you not to drink. You are pregnant, may be pregnant, or are planning to become pregnant. If you drink alcohol: Limit how much you have to: 0-1 drink a day for women. 0-2 drinks a day for men. Know how much alcohol is in your drink. In the [place] one drink equals one 12 oz bottle of beer (355 mL), one 5 oz glass of wine (148 mL), or one 1 oz glass of hard liquor (44 mL). Lifestyle  Work with your health care provider to maintain a healthy body weight or to lose weight. Ask what an ideal weight is for you. Get at least 30 minutes of exercise that causes your heart to beat faster (aerobic exercise) most days of the week. Activities may include walking, swimming, or biking. Include exercise to strengthen your muscles (resistance exercise), such as Pilates or lifting weights, as part of your weekly exercise routine. Try to do these types of exercises for 30 minutes at least 3 days a week. Do not use any products that contain nicotine or tobacco. These products include cigarettes, chewing tobacco, and vaping devices, such as e-cigarettes. If you need help quitting, ask your health care provider. Monitor your blood pressure at home as told by your health care provider. Keep all follow-up visits. This is important. Medicines Take over-the-counter and prescription medicines only as told by your health care provider. Follow directions carefully. Blood  pressure medicines must be taken as prescribed. Do not skip doses of blood pressure medicine. Doing this puts you at risk for problems and can make the medicine less effective. Ask your health care provider about side effects or reactions to medicines that you should watch for. Contact a health care provider if you: Think you are having a reaction to a medicine you are taking. Have headaches that keep coming back (recurring). Feel dizzy. Have swelling in your ankles. Have trouble with your vision. Get help right away if you: Develop a severe headache or confusion. Have unusual weakness or numbness. Feel faint. Have severe pain in your chest or abdomen. Vomit repeatedly. Have trouble breathing. These symptoms may be an emergency. Get help right away. Call 911. Do not wait to see if the symptoms will go away. Do not drive yourself to the hospital. Summary Hypertension is when the force of blood pumping through your arteries is too strong. If this condition is not controlled, it may put you at risk for serious complications. Your personal target blood pressure may vary depending on your medical conditions, your age, and other factors. For most people, a normal blood pressure is less than 120/80. Hypertension is treated with lifestyle changes, medicines, or a combination of both. Lifestyle changes include losing weight, eating a healthy,   low-sodium diet, exercising more, and limiting alcohol. This information is not intended to replace advice given to you by your health care provider. Make sure you discuss any questions you have with your health care provider. Document Revised: 07/02/2021 Document Reviewed: 07/02/2021 Elsevier Patient Education  2023 Elsevier Inc.  

## 2022-07-22 LAB — CMP14+EGFR
ALT: 14 IU/L (ref 0–32)
AST: 20 IU/L (ref 0–40)
Albumin/Globulin Ratio: 1.4 (ref 1.2–2.2)
Albumin: 4.6 g/dL (ref 3.8–4.8)
Alkaline Phosphatase: 70 IU/L (ref 44–121)
BUN/Creatinine Ratio: 13 (ref 12–28)
BUN: 13 mg/dL (ref 8–27)
Bilirubin Total: 0.2 mg/dL (ref 0.0–1.2)
CO2: 19 mmol/L — ABNORMAL LOW (ref 20–29)
Calcium: 10 mg/dL (ref 8.7–10.3)
Chloride: 106 mmol/L (ref 96–106)
Creatinine, Ser: 1.04 mg/dL — ABNORMAL HIGH (ref 0.57–1.00)
Globulin, Total: 3.3 g/dL (ref 1.5–4.5)
Glucose: 94 mg/dL (ref 70–99)
Potassium: 4.4 mmol/L (ref 3.5–5.2)
Sodium: 144 mmol/L (ref 134–144)
Total Protein: 7.9 g/dL (ref 6.0–8.5)
eGFR: 55 mL/min/{1.73_m2} — ABNORMAL LOW (ref >59)

## 2022-07-28 ENCOUNTER — Telehealth: Payer: Self-pay | Admitting: Family

## 2022-07-28 NOTE — Telephone Encounter (Signed)
Left message for patient to call back and schedule Medicare Annual Wellness Visit (AWV) to be completed by video or phone.   Last AWV: 04/22/2019   Please schedule at anytime with [place]     45 minute appointment  Any questions, please contact me at [phone]

## 2022-08-29 ENCOUNTER — Other Ambulatory Visit: Payer: Self-pay | Admitting: Family

## 2022-09-15 ENCOUNTER — Other Ambulatory Visit: Payer: Self-pay | Admitting: Family

## 2022-09-15 DIAGNOSIS — E1159 Type 2 diabetes mellitus with other circulatory complications: Secondary | ICD-10-CM

## 2022-09-23 ENCOUNTER — Ambulatory Visit: Payer: Medicare HMO | Admitting: Family

## 2022-09-29 ENCOUNTER — Telehealth (INDEPENDENT_AMBULATORY_CARE_PROVIDER_SITE_OTHER): Payer: Medicare HMO | Admitting: Family Medicine

## 2022-09-29 ENCOUNTER — Encounter: Payer: Self-pay | Admitting: Family Medicine

## 2022-09-29 DIAGNOSIS — E1169 Type 2 diabetes mellitus with other specified complication: Secondary | ICD-10-CM

## 2022-09-29 DIAGNOSIS — L600 Ingrowing nail: Secondary | ICD-10-CM | POA: Diagnosis not present

## 2022-09-29 NOTE — Progress Notes (Signed)
Virtual Visit via telephone Note Due to COVID-19 pandemic this visit was conducted virtually. This visit type was conducted due to national recommendations for restrictions regarding the COVID-19 Pandemic (e.g. social distancing, sheltering in place) in an effort to limit this patient's exposure and mitigate transmission in our community. All issues noted in this document were discussed and addressed.  A physical exam was not performed with this format.   I connected with Colleen Duran on 09/29/2022 at 1250 by telephone and verified that I am speaking with the correct person using two identifiers. Colleen Duran is currently located at home and patient is currently with them during visit. The provider, Monia Pouch, FNP is located in their office at time of visit.  I discussed the limitations, risks, security and privacy concerns of performing an evaluation and management service by virtual visit and the availability of in person appointments. I also discussed with the patient that there may be a patient responsible charge related to this service. The patient expressed understanding and agreed to proceed.  Subjective:  Patient ID: Colleen Duran, female    DOB: July 27, 1946, 77 y.o.   MRN: [id number]  Chief Complaint:  Foot Problem   HPI: Colleen Duran is a 77 y.o. female presenting on 09/29/2022 for Foot Problem   Pt states she has several toenails which seem to be ingrowing. She is a diabetic and would like a referral to see podiatry. She was seeing podiatry in [place] prior to [place] but has not been in several years. States she has tried to cut her toenails but is unsuccessful. No swelling or drainage reported.      Relevant past medical, surgical, family, and social history reviewed and updated as indicated.  Allergies and medications reviewed and updated.   Past Medical History:  Diagnosis Date   Anemia    Cataract    Bi-lateral, [place], [place]   Diabetes mellitus  without complication ([place])    TYPE 2   High cholesterol    Hypertension    RHA (rheumatoid arthritis) ([place])    RA   Wears glasses     Past Surgical History:  Procedure Laterality Date   ABDOMINAL HYSTERECTOMY     BUNIONECTOMY     bilateral feet   CATARACT EXTRACTION, BILATERAL     CESAREAN SECTION     COLONOSCOPY N/A 05/11/2015   Procedure: COLONOSCOPY;  Surgeon: Danie Binder, MD;  [facility];  Service: Endoscopy;  Laterality: N/A;  2:15 PM   REVERSE SHOULDER ARTHROPLASTY Left 08/23/2020   Procedure: REVERSE SHOULDER ARTHROPLASTY;  Surgeon: Nicholes Stairs, MD;  [facility];  Service: Orthopedics;  Laterality: Left;  2.5 hrs   TOTAL KNEE ARTHROPLASTY Left 07/24/2014   Procedure: LEFT TOTAL KNEE ARTHROPLASTY;  Surgeon: Vickey Huger, MD;  Location: [facility];  Service: Orthopedics;  Laterality: Left;   TOTAL KNEE ARTHROPLASTY Right 10/08/2015   Procedure: RIGHT TOTAL KNEE ARTHROPLASTY;  Surgeon: Vickey Huger, MD;  Location: [facility];  Service: Orthopedics;  Laterality: Right;    Social History   Socioeconomic History   Marital status: Widowed    Spouse name: Not on file   Number of children: 2   Years of education: 72   Highest education level: 12th grade  Occupational History   Occupation: Retired    Comment: Tree surgeon  Tobacco Use   Smoking status: Never   Smokeless tobacco: Never  Vaping Use   Vaping Use: Never used  Substance  and Sexual Activity   Alcohol use: No   Drug use: No   Sexual activity: Not Currently    Birth control/protection: Surgical  Other Topics Concern   Not on file  Social History Narrative   Ms Bonanno lives with her daughter and grandchildren.    Social Determinants of Health   Financial Resource Strain: Low Risk  (12/30/2017)   Overall Financial Resource Strain (CARDIA)    Difficulty of Paying Living Expenses: Not hard at all  Food Insecurity: No Food Insecurity (12/30/2017)   Hunger Vital Sign    Worried  About Running Out of Food in the Last Year: Never true    Ran Out of Food in the Last Year: Never true  Transportation Needs: No Transportation Needs (12/30/2017)   PRAPARE - Hydrologist (Medical): No    Lack of Transportation (Non-Medical): No  Physical Activity: Insufficiently Active (04/22/2019)   Exercise Vital Sign    Days of Exercise per Week: 2 days    Minutes of Exercise per Session: 20 min  Stress: No Stress Concern Present (12/30/2017)   [place]    Feeling of Stress : Not at all  Social Connections: Unknown (04/22/2019)   Social Connection and Isolation Panel [NHANES]    Frequency of Communication with Friends and Family: Not on file    Frequency of Social Gatherings with Friends and Family: Not on file    Attends Religious Services: Not on file    Active Member of Clubs or Organizations: Not on file    Attends Club or Organization Meetings: 1 to 4 times per year    Marital Status: Not on file  Intimate Partner Violence: Not At Risk (12/30/2017)   Humiliation, Afraid, Rape, and Kick questionnaire    Fear of Current or Ex-Partner: No    Emotionally Abused: No    Physically Abused: No    Sexually Abused: No    Outpatient Encounter Medications as of 09/29/2022  Medication Sig   acetaminophen (TYLENOL) 500 MG tablet Take 1,000 mg by mouth every 6 (six) hours as needed for mild pain.   amLODipine (NORVASC) 10 MG tablet Take 1 tablet (10 mg total) by mouth daily.   aspirin EC 81 MG tablet Take 1 tablet (81 mg total) by mouth daily.   diclofenac Sodium (VOLTAREN) 1 % GEL Apply 1 application topically daily as needed for pain. (Patient not taking: Reported on 05/26/2022)   ezetimibe (ZETIA) 10 MG tablet Take 10 mg by mouth daily.   ferrous sulfate 324 MG TBEC Take 324 mg by mouth daily.   meloxicam (MOBIC) 7.5 MG tablet Take 7.5 mg by mouth daily.   metFORMIN (GLUCOPHAGE) 500 MG tablet  TAKE ONE TABLET BY MOUTH TWICE DAILY. TAKE WITH A MEAL. (Patient taking differently: Take 500 mg by mouth 2 (two) times daily with a meal.)   metoprolol succinate (TOPROL-XL) 25 MG 24 hr tablet Take 25 mg by mouth daily.   Multiple Vitamin (MULTIVITAMIN WITH MINERALS) TABS tablet Take 1 tablet by mouth daily.   olmesartan-hydrochlorothiazide (BENICAR HCT) 40-25 MG tablet Take 1 tablet by mouth daily.   spironolactone (ALDACTONE) 25 MG tablet TAKE 1/2 TABLET BY MOUTH ONCE DAILY   No facility-administered encounter medications on file as of 09/29/2022.    No Known Allergies  Review of Systems  Constitutional:  Negative for activity change, appetite change, chills, diaphoresis, fatigue, fever and unexpected weight change.  HENT: Negative.  Eyes: Negative.   Respiratory:  Negative for cough, chest tightness and shortness of breath.   Cardiovascular:  Negative for chest pain, palpitations and leg swelling.  Gastrointestinal:  Negative for blood in stool, constipation, diarrhea, nausea and vomiting.  Endocrine: Negative.   Genitourinary:  Negative for dysuria, frequency and urgency.  Musculoskeletal:  Negative for arthralgias and myalgias.  Skin:        Ingrowing toenails   Allergic/Immunologic: Negative.   Neurological:  Negative for dizziness and headaches.  Hematological: Negative.   Psychiatric/Behavioral:  Negative for confusion, hallucinations, sleep disturbance and suicidal ideas.   All other systems reviewed and are negative.        Observations/Objective: No vital signs or physical exam, this was a virtual health encounter.  Pt alert and oriented, answers all questions appropriately, and able to speak in full sentences.    Assessment and Plan: Jalayah was seen today for foot problem.  Diagnoses and all orders for this visit:  Ingrowing toenail Type 2 diabetes mellitus with other specified complication, without long-term current use of insulin (HCC) Reports several  ingrowing toenails. Will place referral to podiatry as pt is a diabetic. Pt aware to leave toenails alone until seen by a provider. Aware if symptoms worsen and she has not seen podiatry, to follow up in office.  -     Ambulatory referral to Podiatry     Follow Up Instructions: Return if symptoms worsen or fail to improve.    I discussed the assessment and treatment plan with the patient. The patient was provided an opportunity to ask questions and all were answered. The patient agreed with the plan and demonstrated an understanding of the instructions.   The patient was advised to call back or seek an in-person evaluation if the symptoms worsen or if the condition fails to improve as anticipated.  The above assessment and management plan was discussed with the patient. The patient verbalized understanding of and has agreed to the management plan. Patient is aware to call the clinic if they develop any new symptoms or if symptoms persist or worsen. Patient is aware when to return to the clinic for a follow-up visit. Patient educated on when it is appropriate to go to the emergency department.    I provided 12 minutes of time during this telephone encounter.   Monia Pouch, FNP-C [facility] [address] [place], [place] [address] [phone] 09/29/2022

## 2022-10-02 ENCOUNTER — Ambulatory Visit: Payer: Medicare HMO | Admitting: Podiatry

## 2022-10-02 DIAGNOSIS — E119 Type 2 diabetes mellitus without complications: Secondary | ICD-10-CM

## 2022-10-02 DIAGNOSIS — L600 Ingrowing nail: Secondary | ICD-10-CM | POA: Diagnosis not present

## 2022-10-02 DIAGNOSIS — M79674 Pain in right toe(s): Secondary | ICD-10-CM | POA: Diagnosis not present

## 2022-10-02 DIAGNOSIS — B351 Tinea unguium: Secondary | ICD-10-CM

## 2022-10-02 DIAGNOSIS — M79675 Pain in left toe(s): Secondary | ICD-10-CM

## 2022-10-02 DIAGNOSIS — M069 Rheumatoid arthritis, unspecified: Secondary | ICD-10-CM | POA: Diagnosis not present

## 2022-10-02 MED ORDER — CICLOPIROX 8 % EX SOLN: Freq: Every day | CUTANEOUS | 2 refills | Status: DC

## 2022-10-02 NOTE — Patient Instructions (Signed)
Diabetes Mellitus and Foot Care Diabetes, also called diabetes mellitus, may cause problems with your feet and legs because of poor blood flow (circulation). Poor circulation may make your skin: Become thinner and drier. Break more easily. Heal more slowly. Peel and crack. You may also have nerve damage (neuropathy). This can cause decreased feeling in your legs and feet. This means that you may not notice minor injuries to your feet that could lead to more serious problems. Finding and treating problems early is the best way to prevent future foot problems. How to care for your feet Foot hygiene  Wash your feet daily with warm water and mild soap. Do not use hot water. Then, pat your feet and the areas between your toes until they are fully dry. Do not soak your feet. This can dry your skin. Trim your toenails straight across. Do not dig under them or around the cuticle. File the edges of your nails with an emery board or nail file. Apply a moisturizing lotion or petroleum jelly to the skin on your feet and to dry, brittle toenails. Use lotion that does not contain alcohol and is unscented. Do not apply lotion between your toes. Shoes and socks Wear clean socks or stockings every day. Make sure they are not too tight. Do not wear knee-high stockings. These may decrease blood flow to your legs. Wear shoes that fit well and have enough cushioning. Always look in your shoes before you put them on to be sure there are no objects inside. To break in new shoes, wear them for just a few hours a day. This prevents injuries on your feet. Wounds, scrapes, corns, and calluses  Check your feet daily for blisters, cuts, bruises, sores, and redness. If you cannot see the bottom of your feet, use a mirror or ask someone for help. Do not cut off corns or calluses or try to remove them with medicine. If you find a minor scrape, cut, or break in the skin on your feet, keep it and the skin around it clean and  dry. You may clean these areas with mild soap and water. Do not clean the area with peroxide, alcohol, or iodine. If you have a wound, scrape, corn, or callus on your foot, look at it several times a day to make sure it is healing and not infected. Check for: Redness, swelling, or pain. Fluid or blood. Warmth. Pus or a bad smell. General tips Do not cross your legs. This may decrease blood flow to your feet. Do not use heating pads or hot water bottles on your feet. They may burn your skin. If you have lost feeling in your feet or legs, you may not know this is happening until it is too late. Protect your feet from hot and cold by wearing shoes, such as at the beach or on hot pavement. Schedule a complete foot exam at least once a year or more often if you have foot problems. Report any cuts, sores, or bruises to your health care provider right away. Where to find more information American Diabetes Association: diabetes.org Association of Diabetes Care & Education Specialists: diabeteseducator.org Contact a health care provider if: You have a condition that increases your risk of infection, and you have any cuts, sores, or bruises on your feet. You have an injury that is not healing. You have redness on your legs or feet. You feel burning or tingling in your legs or feet. You have pain or cramps in your legs   and feet. Your legs or feet are numb. Your feet always feel cold. You have pain around any toenails. Get help right away if: You have a wound, scrape, corn, or callus on your foot and: You have signs of infection. You have a fever. You have a red line going up your leg. This information is not intended to replace advice given to you by your health care provider. Make sure you discuss any questions you have with your health care provider. Document Revised: 02/26/2022 Document Reviewed: 02/26/2022 Elsevier Patient Education  [place]. Ciclopirox Topical Solution What is  this medication? CICLOPIROX (sye kloe PEER ox) treats fungal infections of the nails. It belongs to a group of medications called antifungals. It will not treat infections caused by bacteria or viruses. This medicine may be used for other purposes; ask your health care provider or pharmacist if you have questions. COMMON BRAND NAME(S): Ciclodan Nail Solution, CNL8, Penlac What should I tell my care team before I take this medication? They need to know if you have any of these conditions: Diabetes (high blood sugar) Immune system problems Organ transplant Receiving steroid inhalers, cream, or lotion Seizures Tingling of the fingers or toes or other nerve disorder An unusual or allergic reaction to ciclopirox, other medications, foods, dyes, or preservatives Pregnant or trying to get pregnant Breast-feeding How should I use this medication? This medication is for external use only. Do not take by mouth. Wash your hands before and after use. If you are treating your hands, only wash your hands before use. Do not get it in your eyes. If you do, rinse your eyes with plenty of cool tap water. Use it as directed on the prescription label at the same time every day. Do not use it more often than directed. Use the medication for the full course as directed by your care team, even if you think you are better. Do not stop using it unless your care team tells you to stop it early. Apply a thin film of the medication to the affected area. Talk to your care team about the use of this medication in children. While it may be prescribed for children as young as 12 years for selected conditions, precautions do apply. Overdosage: If you think you have taken too much of this medicine contact a poison control center or emergency room at once. NOTE: This medicine is only for you. Do not share this medicine with others. What if I miss a dose? If you miss a dose, use it as soon as you can. If it is almost time for  your next dose, use only that dose. Do not use double or extra doses. What may interact with this medication? Interactions are not expected. Do not use any other skin products without telling your care team. This list may not describe all possible interactions. Give your health care provider a list of all the medicines, herbs, non-prescription drugs, or dietary supplements you use. Also tell them if you smoke, drink alcohol, or use illegal drugs. Some items may interact with your medicine. What should I watch for while using this medication? Visit your care team for regular checks on your progress. It may be some time before you see the benefit from this medication. Do not use nail polish or other nail cosmetic products on the treated nails. Removal of the unattached, infected nail by your care team is needed with use of this medication. If you have diabetes or numbness in your fingers  or toes, talk to your care team about proper nail care. What side effects may I notice from receiving this medication? Side effects that you should report to your care team as soon as possible: Allergic reactions--skin rash, itching, hives, swelling of the face, lips, tongue, or throat Burning, itching, crusting, or peeling of treated skin Side effects that usually do not require medical attention (report to your care team if they continue or are bothersome): Change in nail shape, thickness, or color Mild skin irritation, redness, or dryness This list may not describe all possible side effects. Call your doctor for medical advice about side effects. You may report side effects to FDA at 1-800-FDA-1088. Where should I keep my medication? Keep out of the reach of children and pets. Store at room temperature between 20 and 25 degrees C (68 and 77 degrees F). This medication is flammable. Avoid exposure to heat, fire, flame, and smoking. Get rid of medications that are no longer needed or have expired: Take the  medication to a medication take-back program. Check with your pharmacy or law enforcement to find a location. If you cannot return the medication, check the label or package insert to see if the medication should be thrown out in the garbage or flushed down the toilet. If you are not sure, ask your care team. If it is safe to put in the trash, take the medication out of the container. Mix the medication with cat litter, dirt, coffee grounds, or other unwanted substance. Seal the mixture in a bag or container. Put it in the trash. NOTE: This sheet is a summary. It may not cover all possible information. If you have questions about this medicine, talk to your doctor, pharmacist, or health care provider.  2023 Elsevier/Gold Standard (2021-08-06 00:00:00)

## 2022-10-02 NOTE — Progress Notes (Signed)
Subjective:   Patient ID: Colleen Duran, female   DOB: 77 y.o.   MRN: [id number]   HPI Chief Complaint  Patient presents with   Ingrown Toenail    Bilateral feet, great hallux    77 year old female presents the office today with above concerns.  The left big toenail is looking "rough".  She said that her nails are thick and she has difficulty trimming them herself and do get sore.  No drainage. No recent treatment.   Denies any ulcerations.  Last A1c 6.4 on 05/26/2022    Review of Systems  All other systems reviewed and are negative.  Past Medical History:  Diagnosis Date   Anemia    Cataract    Bi-lateral, [place], [place]   Diabetes mellitus without complication ([place])    TYPE 2   High cholesterol    Hypertension    RHA (rheumatoid arthritis) ([place])    RA   Wears glasses     Past Surgical History:  Procedure Laterality Date   ABDOMINAL HYSTERECTOMY     BUNIONECTOMY     bilateral feet   CATARACT EXTRACTION, BILATERAL     CESAREAN SECTION     COLONOSCOPY N/A 05/11/2015   Procedure: COLONOSCOPY;  Surgeon: Danie Binder, MD;  [facility];  Service: Endoscopy;  Laterality: N/A;  2:15 PM   REVERSE SHOULDER ARTHROPLASTY Left 08/23/2020   Procedure: REVERSE SHOULDER ARTHROPLASTY;  Surgeon: Nicholes Stairs, MD;  [facility];  Service: Orthopedics;  Laterality: Left;  2.5 hrs   TOTAL KNEE ARTHROPLASTY Left 07/24/2014   Procedure: LEFT TOTAL KNEE ARTHROPLASTY;  Surgeon: Vickey Huger, MD;  Location: [facility];  Service: Orthopedics;  Laterality: Left;   TOTAL KNEE ARTHROPLASTY Right 10/08/2015   Procedure: RIGHT TOTAL KNEE ARTHROPLASTY;  Surgeon: Vickey Huger, MD;  Location: [facility];  Service: Orthopedics;  Laterality: Right;     Current Outpatient Medications:    acetaminophen (TYLENOL) 500 MG tablet, Take 1,000 mg by mouth every 6 (six) hours as needed for mild pain., Disp: , Rfl:    amLODipine (NORVASC) 10 MG tablet, Take 1 tablet (10 mg total)  by mouth daily., Disp: 90 tablet, Rfl: 1   aspirin EC 81 MG tablet, Take 1 tablet (81 mg total) by mouth daily., Disp: 90 tablet, Rfl: 1   diclofenac Sodium (VOLTAREN) 1 % GEL, Apply 1 application  topically daily as needed for pain., Disp: , Rfl:    ezetimibe (ZETIA) 10 MG tablet, Take 10 mg by mouth daily., Disp: , Rfl:    ferrous sulfate 324 MG TBEC, Take 324 mg by mouth daily., Disp: , Rfl:    meloxicam (MOBIC) 7.5 MG tablet, Take 7.5 mg by mouth daily., Disp: , Rfl:    metFORMIN (GLUCOPHAGE) 500 MG tablet, TAKE ONE TABLET BY MOUTH TWICE DAILY. TAKE WITH A MEAL. (Patient taking differently: Take 500 mg by mouth 2 (two) times daily with a meal.), Disp: 60 tablet, Rfl: 0   metoprolol succinate (TOPROL-XL) 25 MG 24 hr tablet, Take 25 mg by mouth daily., Disp: , Rfl:    Multiple Vitamin (MULTIVITAMIN WITH MINERALS) TABS tablet, Take 1 tablet by mouth daily., Disp: , Rfl:    olmesartan-hydrochlorothiazide (BENICAR HCT) 40-25 MG tablet, Take 1 tablet by mouth daily., Disp: , Rfl:    spironolactone (ALDACTONE) 25 MG tablet, TAKE 1/2 TABLET BY MOUTH ONCE DAILY, Disp: 45 tablet, Rfl: 0  No Known Allergies         Objective:  Physical Exam  General: AAO x3, NAD  Dermatological: Nails are hypertrophic, dystrophic with yellow discoloration most notably of the left first and second toes on the most affected.  She has tenderness nails 1-5 bilaterally.  There is incurvation present to the nails most of the hallux toenail both nail borders.  No edema, erythema or signs of infection.  No open lesions.  Vascular: Dorsalis Pedis artery and Posterior Tibial artery pedal pulses are 2/4 bilateral with immedate capillary fill time.  There is no pain with calf compression, swelling, warmth, erythema.   Neruologic: Grossly intact via light touch bilateral.  Sensation intact with Semmes Weinstein monofilament.  Musculoskeletal: No gross boney pedal deformities bilateral. No pain, crepitus, or limitation noted  with foot and ankle range of motion bilateral. Muscular strength 5/5 in all groups tested bilateral.  Gait: Unassisted, Nonantalgic.       Assessment:   77 year old female with symptomatic onychomycosis, ingrown toenail, diabetic foot exam     Plan:  -Treatment options discussed including all alternatives, risks, and complications -Etiology of symptoms were discussed -Discussed partial nail avulsion versus total nail avulsion but ultimately we both decided to hold off on this today and she was to try to trim the nails first. -Nails debrided 10 without complications or bleeding.  Prescribed Penlac. -Daily foot inspection -Follow-up in 3 months or sooner if any problems arise. In the meantime, encouraged to call the office with any questions, concerns, change in symptoms.   Celesta Gentile, DPM

## 2022-10-17 ENCOUNTER — Ambulatory Visit (INDEPENDENT_AMBULATORY_CARE_PROVIDER_SITE_OTHER): Payer: Medicare HMO | Admitting: Family

## 2022-10-17 ENCOUNTER — Encounter: Payer: Self-pay | Admitting: Family

## 2022-10-17 VITALS — BP 154/82 | HR 82 | Temp 97.5°F | Ht 66.0 in | Wt 211.4 lb

## 2022-10-17 DIAGNOSIS — Z6834 Body mass index (BMI) 34.0-34.9, adult: Secondary | ICD-10-CM | POA: Diagnosis not present

## 2022-10-17 DIAGNOSIS — E1169 Type 2 diabetes mellitus with other specified complication: Secondary | ICD-10-CM

## 2022-10-17 DIAGNOSIS — I152 Hypertension secondary to endocrine disorders: Secondary | ICD-10-CM | POA: Diagnosis not present

## 2022-10-17 DIAGNOSIS — E785 Hyperlipidemia, unspecified: Secondary | ICD-10-CM

## 2022-10-17 DIAGNOSIS — E1159 Type 2 diabetes mellitus with other circulatory complications: Secondary | ICD-10-CM

## 2022-10-17 DIAGNOSIS — M159 Polyosteoarthritis, unspecified: Secondary | ICD-10-CM

## 2022-10-17 DIAGNOSIS — E669 Obesity, unspecified: Secondary | ICD-10-CM | POA: Diagnosis not present

## 2022-10-17 DIAGNOSIS — E119 Type 2 diabetes mellitus without complications: Secondary | ICD-10-CM | POA: Diagnosis not present

## 2022-10-17 DIAGNOSIS — M8580 Other specified disorders of bone density and structure, unspecified site: Secondary | ICD-10-CM

## 2022-10-17 LAB — BAYER DCA HB A1C WAIVED: HB A1C (BAYER DCA - WAIVED): 7.1 % — ABNORMAL HIGH (ref 4.8–5.6)

## 2022-10-17 MED ORDER — AMLODIPINE BESYLATE 10 MG PO TABS: 10.0000 mg | ORAL_TABLET | Freq: Every day | ORAL | 1 refills | Status: DC

## 2022-10-17 MED ORDER — METFORMIN HCL 500 MG PO TABS: 500.0000 mg | ORAL_TABLET | Freq: Two times a day (BID) | ORAL | 1 refills | Status: DC

## 2022-10-17 MED ORDER — OLMESARTAN MEDOXOMIL-HCTZ 40-25 MG PO TABS: 1.0000 | ORAL_TABLET | Freq: Every day | ORAL | 1 refills | Status: DC

## 2022-10-17 MED ORDER — SPIRONOLACTONE 25 MG PO TABS: 12.5000 mg | ORAL_TABLET | Freq: Every day | ORAL | 0 refills | Status: DC

## 2022-10-17 MED ORDER — METOPROLOL SUCCINATE ER 25 MG PO TB24: 25.0000 mg | ORAL_TABLET | Freq: Every day | ORAL | 1 refills | Status: DC

## 2022-10-17 MED ORDER — EZETIMIBE 10 MG PO TABS: 10.0000 mg | ORAL_TABLET | Freq: Every day | ORAL | 2 refills | Status: DC

## 2022-10-17 NOTE — Patient Instructions (Signed)
Health Maintenance After Age 79 After age 79, you are at a higher risk for certain long-term diseases and infections as well as injuries from falls. Falls are a major cause of broken bones and head injuries in people who are older than age 79. Getting regular preventive care can help to keep you healthy and well. Preventive care includes getting regular testing and making lifestyle changes as recommended by your health care provider. Talk with your health care provider about: Which screenings and tests you should have. A screening is a test that checks for a disease when you have no symptoms. A diet and exercise plan that is right for you. What should I know about screenings and tests to prevent falls? Screening and testing are the best ways to find a health problem early. Early diagnosis and treatment give you the best chance of managing medical conditions that are common after age 79. Certain conditions and lifestyle choices may make you more likely to have a fall. Your health care provider may recommend: Regular vision checks. Poor vision and conditions such as cataracts can make you more likely to have a fall. If you wear glasses, make sure to get your prescription updated if your vision changes. Medicine review. Work with your health care provider to regularly review all of the medicines you are taking, including over-the-counter medicines. Ask your health care provider about any side effects that may make you more likely to have a fall. Tell your health care provider if any medicines that you take make you feel dizzy or sleepy. Strength and balance checks. Your health care provider may recommend certain tests to check your strength and balance while standing, walking, or changing positions. Foot health exam. Foot pain and numbness, as well as not wearing proper footwear, can make you more likely to have a fall. Screenings, including: Osteoporosis screening. Osteoporosis is a condition that causes  the bones to get weaker and break more easily. Blood pressure screening. Blood pressure changes and medicines to control blood pressure can make you feel dizzy. Depression screening. You may be more likely to have a fall if you have a fear of falling, feel depressed, or feel unable to do activities that you used to do. Alcohol use screening. Using too much alcohol can affect your balance and may make you more likely to have a fall. Follow these instructions at home: Lifestyle Do not drink alcohol if: Your health care provider tells you not to drink. If you drink alcohol: Limit how much you have to: 0-1 drink a day for women. 0-2 drinks a day for men. Know how much alcohol is in your drink. In the [place] one drink equals one 12 oz bottle of beer (355 mL), one 5 oz glass of wine (148 mL), or one 1 oz glass of hard liquor (44 mL). Do not use any products that contain nicotine or tobacco. These products include cigarettes, chewing tobacco, and vaping devices, such as e-cigarettes. If you need help quitting, ask your health care provider. Activity  Follow a regular exercise program to stay fit. This will help you maintain your balance. Ask your health care provider what types of exercise are appropriate for you. If you need a cane or walker, use it as recommended by your health care provider. Wear supportive shoes that have nonskid soles. Safety  Remove any tripping hazards, such as rugs, cords, and clutter. Install safety equipment such as grab bars in bathrooms and safety rails on stairs. Keep rooms and walkways   well-lit. General instructions Talk with your health care provider about your risks for falling. Tell your health care provider if: You fall. Be sure to tell your health care provider about all falls, even ones that seem minor. You feel dizzy, tiredness (fatigue), or off-balance. Take over-the-counter and prescription medicines only as told by your health care provider. These include  supplements. Eat a healthy diet and maintain a healthy weight. A healthy diet includes low-fat dairy products, low-fat (lean) meats, and fiber from whole grains, beans, and lots of fruits and vegetables. Stay current with your vaccines. Schedule regular health, dental, and eye exams. Summary Having a healthy lifestyle and getting preventive care can help to protect your health and wellness after age 79. Screening and testing are the best way to find a health problem early and help you avoid having a fall. Early diagnosis and treatment give you the best chance for managing medical conditions that are more common for people who are older than age 79. Falls are a major cause of broken bones and head injuries in people who are older than age 79. Take precautions to prevent a fall at home. Work with your health care provider to learn what changes you can make to improve your health and wellness and to prevent falls. This information is not intended to replace advice given to you by your health care provider. Make sure you discuss any questions you have with your health care provider. Document Revised: 01/14/2021 Document Reviewed: 01/14/2021 Elsevier Patient Education  2023 [employer]  

## 2022-10-17 NOTE — Progress Notes (Signed)
Subjective:    Patient ID: Colleen Duran, female    DOB: [birth year], 77 y.o.   MRN: [id number] Chief Complaint  Patient presents with   Medical Management of Chronic Issues    Brought in paper work    PT presents to the office today for chronic follow up.   She has osteopenia and taking calcium and vit d daily. Last dexa scan 01/07/21. Hypertension This is a chronic problem. The current episode started more than 1 year ago. The problem has been waxing and waning since onset. The problem is controlled. Associated symptoms include peripheral edema. Pertinent negatives include no blurred vision, malaise/fatigue or shortness of breath. Risk factors for coronary artery disease include dyslipidemia, obesity and sedentary lifestyle. The current treatment provides moderate improvement.  Hyperlipidemia This is a chronic problem. The current episode started more than 1 year ago. The problem is uncontrolled. Recent lipid tests were reviewed and are normal. Exacerbating diseases include obesity. Pertinent negatives include no shortness of breath. Current antihyperlipidemic treatment includes ezetimibe. The current treatment provides mild improvement of lipids. Risk factors for coronary artery disease include dyslipidemia, diabetes mellitus, hypertension, a sedentary lifestyle and post-menopausal.  Diabetes She presents for her follow-up diabetic visit. She has type 2 diabetes mellitus. Pertinent negatives for diabetes include no blurred vision. Symptoms are stable. Risk factors for coronary artery disease include dyslipidemia, diabetes mellitus, hypertension, sedentary lifestyle and post-menopausal. She is following a generally healthy diet. Her overall blood glucose range is 110-130 mg/dl. Eye exam is current.  Arthritis Presents for follow-up visit. She complains of pain and stiffness. The symptoms have been stable. Affected locations include the left knee, right knee, right MCP and left MCP. Her pain is at  a severity of 2/10 (takes tylenol).      Review of Systems  Constitutional:  Negative for malaise/fatigue.  Eyes:  Negative for blurred vision.  Respiratory:  Negative for shortness of breath.   Musculoskeletal:  Positive for arthritis and stiffness.  All other systems reviewed and are negative.      Objective:   Physical Exam Vitals reviewed.  Constitutional:      General: She is not in acute distress.    Appearance: She is well-developed. She is obese.  HENT:     Head: Normocephalic and atraumatic.     Right Ear: Tympanic membrane normal.     Left Ear: Tympanic membrane normal.  Eyes:     Pupils: Pupils are equal, round, and reactive to light.  Neck:     Thyroid: No thyromegaly.  Cardiovascular:     Rate and Rhythm: Normal rate and regular rhythm.     Heart sounds: Normal heart sounds. No murmur heard. Pulmonary:     Effort: Pulmonary effort is normal. No respiratory distress.     Breath sounds: Normal breath sounds. No wheezing.  Abdominal:     General: Bowel sounds are normal. There is no distension.     Palpations: Abdomen is soft.     Tenderness: There is no abdominal tenderness.  Musculoskeletal:        General: No tenderness. Normal range of motion.     Cervical back: Normal range of motion and neck supple.  Skin:    General: Skin is warm and dry.  Neurological:     Mental Status: She is alert and oriented to person, place, and time.     Cranial Nerves: No cranial nerve deficit.     Deep Tendon Reflexes: Reflexes are normal and symmetric.  Psychiatric:        Behavior: Behavior normal.        Thought Content: Thought content normal.        Judgment: Judgment normal.       BP (!) 163/82   Pulse 82   Temp (!) 97.5 F (36.4 C) (Temporal)   Ht 5' 6"$  (1.676 m)   Wt 211 lb 6.4 oz (95.9 kg)   SpO2 99%   BMI 34.12 kg/m      Assessment & Plan:  Colleen Duran comes in today with chief complaint of Medical Management of Chronic Issues (Brought in paper  work )   Diagnosis and orders addressed:  1. Hypertension associated with diabetes ([place]) - amLODipine (NORVASC) 10 MG tablet; Take 1 tablet (10 mg total) by mouth daily.  Dispense: 90 tablet; Refill: 1 - metoprolol succinate (TOPROL-XL) 25 MG 24 hr tablet; Take 1 tablet (25 mg total) by mouth daily.  Dispense: 90 tablet; Refill: 1 - olmesartan-hydrochlorothiazide (BENICAR HCT) 40-25 MG tablet; Take 1 tablet by mouth daily.  Dispense: 90 tablet; Refill: 1 - spironolactone (ALDACTONE) 25 MG tablet; Take 0.5 tablets (12.5 mg total) by mouth daily.  Dispense: 45 tablet; Refill: 0 - CMP14+EGFR  2. Type 2 diabetes mellitus without complication, without long-term current use of insulin ([facility]) - metFORMIN (GLUCOPHAGE) 500 MG tablet; Take 1 tablet (500 mg total) by mouth 2 (two) times daily with a meal.  Dispense: 180 tablet; Refill: 1 - Bayer DCA Hb A1c Waived - CMP14+EGFR  3. Hyperlipidemia associated with type 2 diabetes mellitus ([facility]CC) - ezetimibe (ZETIA) 10 MG tablet; Take 1 tablet (10 mg total) by mouth daily.  Dispense: 90 tablet; Refill: 2 - CMP14+EGFR  4. Obesity (BMI 30-39.9) - CMP14+EGFR  5. Primary osteoarthritis involving multiple joints - CMP14+EGFR  6. Osteopenia, unspecified location - CMP14+EGFR   Labs pending Continue medications  Health care POA completed today and placed in chart Health Maintenance reviewed Diet and exercise encouraged  Follow up plan: 4 months    Evelina Dun, FNP

## 2022-10-18 LAB — CMP14+EGFR
ALT: 15 IU/L (ref 0–32)
AST: 16 IU/L (ref 0–40)
Albumin/Globulin Ratio: 1.6 (ref 1.2–2.2)
Albumin: 4.6 g/dL (ref 3.8–4.8)
Alkaline Phosphatase: 68 IU/L (ref 44–121)
BUN/Creatinine Ratio: 21 (ref 12–28)
BUN: 22 mg/dL (ref 8–27)
Bilirubin Total: 0.2 mg/dL (ref 0.0–1.2)
CO2: 22 mmol/L (ref 20–29)
Calcium: 9.7 mg/dL (ref 8.7–10.3)
Chloride: 102 mmol/L (ref 96–106)
Creatinine, Ser: 1.04 mg/dL — ABNORMAL HIGH (ref 0.57–1.00)
Globulin, Total: 2.8 g/dL (ref 1.5–4.5)
Glucose: 156 mg/dL — ABNORMAL HIGH (ref 70–99)
Potassium: 3.8 mmol/L (ref 3.5–5.2)
Sodium: 141 mmol/L (ref 134–144)
Total Protein: 7.4 g/dL (ref 6.0–8.5)
eGFR: 55 mL/min/{1.73_m2} — ABNORMAL LOW (ref >59)

## 2022-10-21 ENCOUNTER — Ambulatory Visit: Payer: Medicare HMO | Admitting: Family

## 2022-10-22 ENCOUNTER — Encounter: Payer: Self-pay | Admitting: Family Medicine

## 2022-10-29 ENCOUNTER — Telehealth: Payer: Self-pay | Admitting: Family

## 2022-10-29 NOTE — Telephone Encounter (Signed)
Contacted Colleen Duran to schedule their annual wellness visit. Appointment made for 11/06/2022.  Thank you,  [name],  [name] Direct Dial ??[phone]

## 2022-11-06 ENCOUNTER — Ambulatory Visit (INDEPENDENT_AMBULATORY_CARE_PROVIDER_SITE_OTHER): Payer: Medicare HMO

## 2022-11-06 VITALS — Ht 67.0 in | Wt 211.0 lb

## 2022-11-06 DIAGNOSIS — Z0000 Encounter for general adult medical examination without abnormal findings: Secondary | ICD-10-CM | POA: Diagnosis not present

## 2022-11-06 NOTE — Patient Instructions (Signed)
Ms. Colleen Duran , Thank you for taking time to come for your Medicare Wellness Visit. I appreciate your ongoing commitment to your health goals. Please review the following plan we discussed and let me know if I can assist you in the future.   These are the goals we discussed:  Goals       "I want to keep my arthritis pain under control" (pt-stated)      Current Barriers:  Chronic Disease Management support and education needs related to arthritis  Nurse Case Manager Clinical Goal(s):  Over the next 90 days, the patient will demonstrate ongoing self health care management ability as evidenced by taking medication as prescribed to keep pain at a manageable level.*  Interventions:  Advised patient to continue taking extra strength Tylenol as needed for joint pain Advised to report any new or worsening symptoms by calling RNCM at [phone] or PCP at [phone].  Patient Self Care Activities:  Performs ADL's independently Performs IADL's independently  Please see past updates related to this goal by clicking on the "Past Updates" button in the selected goal        "I want to keep my blood sugar under control" (pt-stated)      Current Barriers:  Chronic Disease Management support and education needs related to diabetes  Nurse Case Manager Clinical Goal(s):  Over the next 30 days, patient will work with [facility] to address needs related to diabetes  Interventions:  General discussion regarding current health status Chart reviewed Appointment with PCP was moved to 05/30/2019 RNCM will connect with PCP prior to that appointment to discuss potential improvement in blood sugar results with increased metformin dose Patient advised to continue current dose of medications until then Patient provided with CCM contact information Advised to call with any nursing related questions or concerns  Patient Self Care Activities:  Performs ADL's independently Performs IADL's independently  Please see  past updates related to this goal by clicking on the "Past Updates" button in the selected goal        "I want to make sure my blood pressure is ok" (pt-stated)      Current Barriers:  Chronic Disease Management support and education needs related to hypertension  Nurse Case Manager Clinical Goal(s):  Over the next 30 days, patient will work with [facility] to address needs related to self management of hypertension  Interventions:  Advised patient to continue checking blood pressure daily and as needed.  Provided education to patient re: effects of diet on blood pressure Discussed plans with patient for ongoing care management follow up and provided patient with direct contact information for care management team Discussed timing of BP checks.  Often checks in the mornings before meds and BP is up. Sometimes checks in the afternoon and the readings vary  BP this morning 148/98 before meds Verified medications. Taking amlodipine and hyzaar in the morning.  Asked patient to check and record blood pressure 1 hr after taking HTN meds. Spot check and record time and reading a few times this during the week.  Will talk with patient next week regarding readings and will discuss with PCP Patient will call [phone] or [phone] to report any readings above provider recommended range  Patient Self Care Activities:  Performs ADL's independently Performs IADL's independently  Initial goal documentation       "I would like to continue being independent and maintain or improve my level of health" (pt-stated)      Current Barriers:  None  Nurse Case Manager Clinical Goal(s):  Over the next 30 days, the patient will demonstrate ongoing self health care management ability as evidenced by maintaining her current level of independence.*  Interventions:  Assessed current activity level: Walks around the track in town 20 min to 1 hour 3 times a week. Typically walks early before it gets too  hot Again encouraged continued physical activity Reminded patient to  avoid outdoor activities during the heat of the day and to limit any time spent outdoors until the heat and humidity have decreased.  Discussed plans with patient for ongoing care management follow up and provided patient with direct contact information for care management team  Patient Self Care Activities:  Performs ADL's independently Performs IADL's independently  Please see past updates related to this goal by clicking on the "Past Updates" button in the selected goal        Increase physical activity      Pt has not been able to walk around downtown [place] like she used to because of the virus. Encouraged pt to walk more around her neighborhood if she feels safe and she does.        This is a list of the screening recommended for you and due dates:  Health Maintenance  Topic Date Due   COVID-19 Vaccine (1) Never done   Hemoglobin A1C  04/17/2023   Yearly kidney health urinalysis for diabetes  05/27/2023   Eye exam for diabetics  06/19/2023   Complete foot exam   10/03/2023   Yearly kidney function blood test for diabetes  10/18/2023   Medicare Annual Wellness Visit  11/06/2023   DTaP/Tdap/Td vaccine (2 - Td or Tdap) 10/31/2027   Pneumonia Vaccine  Completed   Flu Shot  Completed   DEXA scan (bone density measurement)  Completed   Hepatitis C Screening: USPSTF Recommendation to screen - Ages 24-79 yo.  Completed   Zoster (Shingles) Vaccine  Completed   HPV Vaccine  Aged Out   Colon Cancer Screening  Discontinued    Advanced directives: Advance directive discussed with you today. I have provided a copy for you to complete at home and have notarized. Once this is complete please bring a copy in to our office so we can scan it into your chart.   Conditions/risks identified: Aim for 30 minutes of exercise or brisk walking, 6-8 glasses of water, and 5 servings of fruits and vegetables each day.   Next  appointment: Follow up in one year for your annual wellness visit    Preventive Care 65 Years and Older, Female Preventive care refers to lifestyle choices and visits with your health care provider that can promote health and wellness. What does preventive care include? A yearly physical exam. This is also called an annual well check. Dental exams once or twice a year. Routine eye exams. Ask your health care provider how often you should have your eyes checked. Personal lifestyle choices, including: Daily care of your teeth and gums. Regular physical activity. Eating a healthy diet. Avoiding tobacco and drug use. Limiting alcohol use. Practicing safe sex. Taking low-dose aspirin every day. Taking vitamin and mineral supplements as recommended by your health care provider. What happens during an annual well check? The services and screenings done by your health care provider during your annual well check will depend on your age, overall health, lifestyle risk factors, and family history of disease. Counseling  Your health care provider may ask you questions about your: Alcohol use. Tobacco  use. Drug use. Emotional well-being. Home and relationship well-being. Sexual activity. Eating habits. History of falls. Memory and ability to understand (cognition). Work and work Statistician. Reproductive health. Screening  You may have the following tests or measurements: Height, weight, and BMI. Blood pressure. Lipid and cholesterol levels. These may be checked every 5 years, or more frequently if you are over 60 years old. Skin check. Lung cancer screening. You may have this screening every year starting at age 24 if you have a 30-pack-year history of smoking and currently smoke or have quit within the past 15 years. Fecal occult blood test (FOBT) of the stool. You may have this test every year starting at age 77. Flexible sigmoidoscopy or colonoscopy. You may have a sigmoidoscopy every  5 years or a colonoscopy every 10 years starting at age 50. Hepatitis C blood test. Hepatitis B blood test. Sexually transmitted disease (STD) testing. Diabetes screening. This is done by checking your blood sugar (glucose) after you have not eaten for a while (fasting). You may have this done every 1-3 years. Bone density scan. This is done to screen for osteoporosis. You may have this done starting at age 40. Mammogram. This may be done every 1-2 years. Talk to your health care provider about how often you should have regular mammograms. Talk with your health care provider about your test results, treatment options, and if necessary, the need for more tests. Vaccines  Your health care provider may recommend certain vaccines, such as: Influenza vaccine. This is recommended every year. Tetanus, diphtheria, and acellular pertussis (Tdap, Td) vaccine. You may need a Td booster every 10 years. Zoster vaccine. You may need this after age 110. Pneumococcal 13-valent conjugate (PCV13) vaccine. One dose is recommended after age 39. Pneumococcal polysaccharide (PPSV23) vaccine. One dose is recommended after age 60. Talk to your health care provider about which screenings and vaccines you need and how often you need them. This information is not intended to replace advice given to you by your health care provider. Make sure you discuss any questions you have with your health care provider. Document Released: 09/21/2015 Document Revised: 05/14/2016 Document Reviewed: 06/26/2015 Elsevier Interactive Patient Education  2017 [place] Prevention in the Home Falls can cause injuries. They can happen to people of all ages. There are many things you can do to make your home safe and to help prevent falls. What can I do on the outside of my home? Regularly fix the edges of walkways and driveways and fix any cracks. Remove anything that might make you trip as you walk through a door, such as a raised  step or threshold. Trim any bushes or trees on the path to your home. Use bright outdoor lighting. Clear any walking paths of anything that might make someone trip, such as rocks or tools. Regularly check to see if handrails are loose or broken. Make sure that both sides of any steps have handrails. Any raised decks and porches should have guardrails on the edges. Have any leaves, snow, or ice cleared regularly. Use sand or salt on walking paths during winter. Clean up any spills in your garage right away. This includes oil or grease spills. What can I do in the bathroom? Use night lights. Install grab bars by the toilet and in the tub and shower. Do not use towel bars as grab bars. Use non-skid mats or decals in the tub or shower. If you need to sit down in the shower, use a  plastic, non-slip stool. Keep the floor dry. Clean up any water that spills on the floor as soon as it happens. Remove soap buildup in the tub or shower regularly. Attach bath mats securely with double-sided non-slip rug tape. Do not have throw rugs and other things on the floor that can make you trip. What can I do in the bedroom? Use night lights. Make sure that you have a light by your bed that is easy to reach. Do not use any sheets or blankets that are too big for your bed. They should not hang down onto the floor. Have a firm chair that has side arms. You can use this for support while you get dressed. Do not have throw rugs and other things on the floor that can make you trip. What can I do in the kitchen? Clean up any spills right away. Avoid walking on wet floors. Keep items that you use a lot in easy-to-reach places. If you need to reach something above you, use a strong step stool that has a grab bar. Keep electrical cords out of the way. Do not use floor polish or wax that makes floors slippery. If you must use wax, use non-skid floor wax. Do not have throw rugs and other things on the floor that can  make you trip. What can I do with my stairs? Do not leave any items on the stairs. Make sure that there are handrails on both sides of the stairs and use them. Fix handrails that are broken or loose. Make sure that handrails are as long as the stairways. Check any carpeting to make sure that it is firmly attached to the stairs. Fix any carpet that is loose or worn. Avoid having throw rugs at the top or bottom of the stairs. If you do have throw rugs, attach them to the floor with carpet tape. Make sure that you have a light switch at the top of the stairs and the bottom of the stairs. If you do not have them, ask someone to add them for you. What else can I do to help prevent falls? Wear shoes that: Do not have high heels. Have rubber bottoms. Are comfortable and fit you well. Are closed at the toe. Do not wear sandals. If you use a stepladder: Make sure that it is fully opened. Do not climb a closed stepladder. Make sure that both sides of the stepladder are locked into place. Ask someone to hold it for you, if possible. Clearly mark and make sure that you can see: Any grab bars or handrails. First and last steps. Where the edge of each step is. Use tools that help you move around (mobility aids) if they are needed. These include: Canes. Walkers. Scooters. Crutches. Turn on the lights when you go into a dark area. Replace any light bulbs as soon as they burn out. Set up your furniture so you have a clear path. Avoid moving your furniture around. If any of your floors are uneven, fix them. If there are any pets around you, be aware of where they are. Review your medicines with your doctor. Some medicines can make you feel dizzy. This can increase your chance of falling. Ask your doctor what other things that you can do to help prevent falls. This information is not intended to replace advice given to you by your health care provider. Make sure you discuss any questions you have with  your health care provider. Document Released: 06/21/2009 Document Revised: 01/31/2016  Document Reviewed: 09/29/2014 Elsevier Interactive Patient Education  2017 [employer].

## 2022-11-06 NOTE — Progress Notes (Signed)
Subjective:   Colleen Duran is a 77 y.o. female who presents for Medicare Annual (Subsequent) preventive examination. I connected with  [name] on 11/06/22 by a audio enabled telemedicine application and verified that I am speaking with the correct person using two identifiers.  Patient Location: Home  Provider Location: Home Office  I discussed the limitations of evaluation and management by telemedicine. The patient expressed understanding and agreed to proceed.  Review of Systems     Cardiac Risk Factors include: advanced age (>9mn, >>62women);diabetes mellitus;dyslipidemia;hypertension     Objective:    Today's Vitals   11/06/22 1526  Weight: 211 lb (95.7 kg)  Height: '5\' 7"'$  (1.702 m)   Body mass index is 33.05 kg/m.     11/06/2022    3:29 PM 08/21/2020   10:41 AM 04/22/2019    4:04 PM 04/22/2019    2:40 PM 12/30/2017   11:02 AM 11/05/2015    8:14 AM 09/27/2015    8:36 AM  Advanced Directives  Does Patient Have a Medical Advance Directive? Yes No Yes Yes Yes Yes;No No  [place]  Living will Living will Living will;Healthcare Power of Attorney    Does patient want to make changes to medical advance directive? No - Patient declined  No - Patient declined No - Patient declined No - Patient declined No - Patient declined   Copy of HGrayin Chart? Yes - validated most recent copy scanned in chart (See row information)    No - copy requested    Would patient like information on creating a medical advance directive?     No - Patient declined  Yes - Educational materials given    Current Medications (verified) Outpatient Encounter Medications as of 11/06/2022  Medication Sig   acetaminophen (TYLENOL) 500 MG tablet Take 1,000 mg by mouth every 6 (six) hours as needed for mild pain.   amLODipine (NORVASC) 10 MG tablet Take 1 tablet (10 mg total) by mouth daily.   aspirin EC 81 MG tablet Take 1  tablet (81 mg total) by mouth daily.   busPIRone (BUSPAR) 10 MG tablet Take 10 mg by mouth 2 (two) times daily.   ciclopirox (PENLAC) 8 % solution Apply topically at bedtime. Apply over nail and surrounding skin. Apply daily over previous coat. After seven (7) days, may remove with alcohol and continue cycle.   diclofenac Sodium (VOLTAREN) 1 % GEL Apply 1 application  topically daily as needed for pain.   ezetimibe (ZETIA) 10 MG tablet Take 1 tablet (10 mg total) by mouth daily.   ferrous sulfate 324 MG TBEC Take 324 mg by mouth daily.   meloxicam (MOBIC) 7.5 MG tablet Take 7.5 mg by mouth daily.   metFORMIN (GLUCOPHAGE) 500 MG tablet Take 1 tablet (500 mg total) by mouth 2 (two) times daily with a meal.   metoprolol succinate (TOPROL-XL) 25 MG 24 hr tablet Take 1 tablet (25 mg total) by mouth daily.   Multiple Vitamin (MULTIVITAMIN WITH MINERALS) TABS tablet Take 1 tablet by mouth daily.   olmesartan-hydrochlorothiazide (BENICAR HCT) 40-25 MG tablet Take 1 tablet by mouth daily.   spironolactone (ALDACTONE) 25 MG tablet Take 0.5 tablets (12.5 mg total) by mouth daily.   No facility-administered encounter medications on file as of 11/06/2022.    Allergies (verified) Patient has no known allergies.   History: Past Medical History:  Diagnosis Date   Anemia  Cataract    Bi-lateral, [place], [place]   Diabetes mellitus without complication ([place])    TYPE 2   High cholesterol    Hypertension    RHA (rheumatoid arthritis) ([place])    RA   Wears glasses    Past Surgical History:  Procedure Laterality Date   ABDOMINAL HYSTERECTOMY     BUNIONECTOMY     bilateral feet   CATARACT EXTRACTION, BILATERAL     CESAREAN SECTION     COLONOSCOPY N/A 05/11/2015   Procedure: COLONOSCOPY;  Surgeon: Danie Binder, MD;  [facility];  Service: Endoscopy;  Laterality: N/A;  2:15 PM   REVERSE SHOULDER ARTHROPLASTY Left 08/23/2020   Procedure: REVERSE SHOULDER ARTHROPLASTY;   Surgeon: Nicholes Stairs, MD;  Location: [facility];  Service: Orthopedics;  Laterality: Left;  2.5 hrs   TOTAL KNEE ARTHROPLASTY Left 07/24/2014   Procedure: LEFT TOTAL KNEE ARTHROPLASTY;  Surgeon: Vickey Huger, MD;  Location: [facility];  Service: Orthopedics;  Laterality: Left;   TOTAL KNEE ARTHROPLASTY Right 10/08/2015   Procedure: RIGHT TOTAL KNEE ARTHROPLASTY;  Surgeon: Vickey Huger, MD;  Location: [facility];  Service: Orthopedics;  Laterality: Right;   Family History  Problem Relation Age of Onset   Hypertension Mother    Stroke Mother    Cancer Sister        lung   Healthy Daughter    Healthy Daughter    Cancer Brother    Mental retardation Brother    Breast cancer Neg Hx    Social History   Socioeconomic History   Marital status: Widowed    Spouse name: Not on file   Number of children: 2   Years of education: 59   Highest education level: 12th grade  Occupational History   Occupation: Retired    Comment: Tree surgeon  Tobacco Use   Smoking status: Never   Smokeless tobacco: Never  Vaping Use   Vaping Use: Never used  Substance and Sexual Activity   Alcohol use: No   Drug use: No   Sexual activity: Not Currently    Birth control/protection: Surgical  Other Topics Concern   Not on file  Social History Narrative   Ms Wieder lives with her daughter and grandchildren.    Social Determinants of Health   Financial Resource Strain: Low Risk  (11/06/2022)   Overall Financial Resource Strain (CARDIA)    Difficulty of Paying Living Expenses: Not hard at all  Food Insecurity: No Food Insecurity (11/06/2022)   Hunger Vital Sign    Worried About Running Out of Food in the Last Year: Never true    Ran Out of Food in the Last Year: Never true  Transportation Needs: No Transportation Needs (11/06/2022)   PRAPARE - Hydrologist (Medical): No    Lack of Transportation (Non-Medical): No  Physical Activity: Insufficiently Active (11/06/2022)    Exercise Vital Sign    Days of Exercise per Week: 3 days    Minutes of Exercise per Session: 30 min  Stress: No Stress Concern Present (11/06/2022)   [place]    Feeling of Stress : Not at all  Social Connections: Moderately Isolated (11/06/2022)   Social Connection and Isolation Panel [NHANES]    Frequency of Communication with Friends and Family: More than three times a week    Frequency of Social Gatherings with Friends and Family: More than three times a week  Attends Religious Services: More than 4 times per year    Active Member of Clubs or Organizations: No    Attends Archivist Meetings: Never    Marital Status: Widowed    Tobacco Counseling Counseling given: Not Answered   Clinical Intake:  Pre-visit preparation completed: Yes  Pain : No/denies pain     Nutritional Risks: None Diabetes: Yes CBG done?: No Did pt. bring in CBG monitor from home?: No  How often do you need to have someone help you when you read instructions, pamphlets, or other written materials from your doctor or pharmacy?: 1 - Never  Diabetic?yes Nutrition Risk Assessment:  Has the patient had any N/V/D within the last 2 months?  No  Does the patient have any non-healing wounds?  No  Has the patient had any unintentional weight loss or weight gain?  No   Diabetes:  Is the patient diabetic?  Yes  If diabetic, was a CBG obtained today?  No  Did the patient bring in their glucometer from home?  No  How often do you monitor your CBG's? Daily .   Financial Strains and Diabetes Management:  Are you having any financial strains with the device, your supplies or your medication? No .  Does the patient want to be seen by Chronic Care Management for management of their diabetes?  No  Would the patient like to be referred to a Nutritionist or for Diabetic Management?  No   Diabetic Exams:  Diabetic Eye Exam: Completed  02/2022 Diabetic Foot Exam: Overdue, Pt has been advised about the importance in completing this exam. Pt is scheduled for diabetic foot exam on next office visit .   Interpreter Needed?: No  Information entered by :: Jadene Pierini, LPN   Activities of Daily Living    11/06/2022    3:29 PM  In your present state of health, do you have any difficulty performing the following activities:  Hearing? 0  Vision? 0  Difficulty concentrating or making decisions? 0  Walking or climbing stairs? 0  Dressing or bathing? 0  Doing errands, shopping? 0  Preparing Food and eating ? N  Using the Toilet? N  In the past six months, have you accidently leaked urine? N  Do you have problems with loss of bowel control? N  Managing your Medications? N  Managing your Finances? N  Housekeeping or managing your Housekeeping? N    Patient Care Team: [name], FNP as PCP - General (Family Medicine)  Indicate any recent Medical Services you may have received from other than [facility] providers in the past year (date may be approximate).     Assessment:   This is a routine wellness examination for [place].  Hearing/Vision screen Vision Screening - Comments:: Wears rx glasses - up to date with routine eye exams with  Dr.johnson   Dietary issues and exercise activities discussed: Current Exercise Habits: Home exercise routine, Type of exercise: walking, Time (Minutes): 30, Frequency (Times/Week): 3, Weekly Exercise (Minutes/Week): 90, Intensity: Mild, Exercise limited by: None identified   Goals Addressed             This Visit's Progress    Increase physical activity   On track    Pt has not been able to walk around downtown [place] like she used to because of the virus. Encouraged pt to walk more around her neighborhood if she feels safe and she does.       Depression Screen  11/06/2022    3:27 PM 10/17/2022    2:58 PM 07/21/2022    1:45 PM 05/26/2022   10:45 AM 05/26/2022   10:38 AM  05/30/2019    9:34 AM 04/22/2019    4:05 PM  PHQ 2/9 Scores  PHQ - 2 Score 0 0 0 0 0 0 0  PHQ- 9 Score 0 0 0  0      Fall Risk    11/06/2022    3:26 PM 07/21/2022    1:45 PM 05/26/2022   10:45 AM 05/26/2022   10:38 AM 05/30/2019    9:37 AM  Fall Risk   Falls in the past year? 0 1 0 0 0  Number falls in past yr: 0      Injury with Fall? 0      Risk for fall due to : No Fall Risks      Follow up Falls prevention discussed        [place]:  Any stairs in or around the home? No  If so, are there any without handrails? No  Home free of loose throw rugs in walkways, pet beds, electrical cords, etc? Yes  Adequate lighting in your home to reduce risk of falls? Yes   ASSISTIVE DEVICES UTILIZED TO PREVENT FALLS:  Life alert? No  Use of a cane, walker or w/c? No  Grab bars in the bathroom? No  Shower chair or bench in shower? Yes  Elevated toilet seat or a handicapped toilet? No       12/30/2017   11:15 AM  MMSE - Mini Mental State Exam  Orientation to time 5  Orientation to Place 5  Registration 3  Attention/ Calculation 1  Recall 3  Language- name 2 objects 2  Language- repeat 1  Language- follow 3 step command 3  Language- read & follow direction 1  Write a sentence 1  Copy design 1  Total score 26        11/06/2022    3:29 PM 04/22/2019    2:44 PM  6CIT Screen  What Year? 0 points 0 points  What month? 0 points 0 points  What time? 0 points 0 points  Count back from 20 0 points 2 points  Months in reverse 0 points 2 points  Repeat phrase 0 points 2 points  Total Score 0 points 6 points    Immunizations Immunization History  Administered Date(s) Administered   Fluad Quad(high Dose 65+) 05/30/2019, 05/14/2022   Influenza, High Dose Seasonal PF 07/21/2017, 06/16/2018   Influenza-Unspecified 06/06/2014   Pneumococcal Conjugate-13 12/02/2016   Pneumococcal Polysaccharide-23 10/30/2017   Tdap 10/30/2017   Zoster Recombinat  (Shingrix) 12/30/2017, 09/07/2019    TDAP status: Up to date  Flu Vaccine status: Up to date  Pneumococcal vaccine status: Up to date  Covid-19 vaccine status: Completed vaccines  Qualifies for Shingles Vaccine? Yes   Zostavax completed Yes   Shingrix Completed?: Yes  Screening Tests Health Maintenance  Topic Date Due   COVID-19 Vaccine (1) Never done   HEMOGLOBIN A1C  04/17/2023   Diabetic kidney evaluation - Urine ACR  05/27/2023   OPHTHALMOLOGY EXAM  06/19/2023   FOOT EXAM  10/03/2023   Diabetic kidney evaluation - eGFR measurement  10/18/2023   Medicare Annual Wellness (AWV)  11/06/2023   DTaP/Tdap/Td (2 - Td or Tdap) 10/31/2027   Pneumonia Vaccine 29+ Years old  Completed   INFLUENZA VACCINE  Completed   DEXA SCAN  Completed   Hepatitis C Screening  Completed   Zoster Vaccines- Shingrix  Completed   HPV VACCINES  Aged Out   COLONOSCOPY (Pts 45-60yr Insurance coverage will need to be confirmed)  Discontinued    Health Maintenance  Health Maintenance Due  Topic Date Due   COVID-19 Vaccine (1) Never done    Colorectal cancer screening: No longer required.   Mammogram status: No longer required due to age.  Bone Density status: Completed 01/07/2021. Results reflect: Bone density results: OSTEOPENIA. Repeat every 5 years.  Lung Cancer Screening: (Low Dose CT Chest recommended if Age 79-80years, 30 pack-year currently smoking OR have quit w/in 15years.) does not qualify.   Lung Cancer Screening Referral: n/a  Additional Screening:  Hepatitis C Screening: does not qualify;  Vision Screening: Recommended annual ophthalmology exams for early detection of glaucoma and other disorders of the eye. Is the patient up to date with their annual eye exam?  Yes  Who is the provider or what is the name of the office in which the patient attends annual eye exams? Dr.Johnson  If pt is not established with a provider, would they like to be referred to a provider to  establish care? No .   Dental Screening: Recommended annual dental exams for proper oral hygiene  Community Resource Referral / Chronic Care Management: CRR required this visit?  No   CCM required this visit?  No      Plan:     I have personally reviewed and noted the following in the patient's chart:   Medical and social history Use of alcohol, tobacco or illicit drugs  Current medications and supplements including opioid prescriptions. Patient is not currently taking opioid prescriptions. Functional ability and status Nutritional status Physical activity Advanced directives List of other physicians Hospitalizations, surgeries, and ER visits in previous 12 months Vitals Screenings to include cognitive, depression, and falls Referrals and appointments  In addition, I have reviewed and discussed with patient certain preventive protocols, quality metrics, and best practice recommendations. A written personalized care plan for preventive services as well as general preventive health recommendations were provided to patient.     LDaphane Shepherd LPN   [id number]  Nurse Notes: none

## 2023-01-02 ENCOUNTER — Ambulatory Visit: Payer: Medicare HMO | Admitting: Podiatry

## 2023-01-13 DIAGNOSIS — M545 Low back pain, unspecified: Secondary | ICD-10-CM | POA: Diagnosis not present

## 2023-01-15 ENCOUNTER — Ambulatory Visit: Payer: Medicare HMO | Admitting: Family

## 2023-01-26 ENCOUNTER — Ambulatory Visit: Payer: Medicare HMO | Admitting: Podiatry

## 2023-01-26 DIAGNOSIS — M79675 Pain in left toe(s): Secondary | ICD-10-CM | POA: Diagnosis not present

## 2023-01-26 DIAGNOSIS — B351 Tinea unguium: Secondary | ICD-10-CM | POA: Diagnosis not present

## 2023-01-26 DIAGNOSIS — M79674 Pain in right toe(s): Secondary | ICD-10-CM

## 2023-01-26 MED ORDER — CICLOPIROX 8 % EX SOLN: Freq: Every day | CUTANEOUS | 2 refills | Status: DC

## 2023-01-26 NOTE — Progress Notes (Signed)
Subjective: Chief Complaint  Patient presents with   Nail Problem    Patient reports using the topical treatment as directed for the nail fungus and it seems to be helping the nails.    States his nails get long and thick and are causing discomfort.  No swelling, redness or any drainage.  No open lesions.  No   Objective: AAO x3, NAD DP/PT pulses palpable bilaterally, CRT less than 3 seconds To be hypertrophic and dystrophic yellow discoloration but there is clearing of the proximal nail folds.  No edema, erythema or signs of infection.  No open lesions. No pain with calf compression, swelling, warmth, erythema  Assessment: Symptomatic onychomycosis  Plan: -All treatment options discussed with the patient including all alternatives, risks, complications.  -Sharp debrided nails x 10 without any complications or bleeding.  Continue topical medication.  Refilled [place] today. -Patient encouraged to call the office with any questions, concerns, change in symptoms.   Vivi Barrack DPM

## 2023-02-10 ENCOUNTER — Ambulatory Visit: Payer: Medicare HMO | Admitting: Family

## 2023-02-24 ENCOUNTER — Ambulatory Visit (INDEPENDENT_AMBULATORY_CARE_PROVIDER_SITE_OTHER): Payer: Medicare HMO | Admitting: Family

## 2023-02-24 ENCOUNTER — Encounter: Payer: Self-pay | Admitting: Family

## 2023-02-24 VITALS — BP 135/76 | HR 76 | Temp 97.2°F | Wt 208.6 lb

## 2023-02-24 DIAGNOSIS — M159 Polyosteoarthritis, unspecified: Secondary | ICD-10-CM

## 2023-02-24 DIAGNOSIS — M8580 Other specified disorders of bone density and structure, unspecified site: Secondary | ICD-10-CM | POA: Diagnosis not present

## 2023-02-24 DIAGNOSIS — E785 Hyperlipidemia, unspecified: Secondary | ICD-10-CM

## 2023-02-24 DIAGNOSIS — I152 Hypertension secondary to endocrine disorders: Secondary | ICD-10-CM | POA: Diagnosis not present

## 2023-02-24 DIAGNOSIS — E1169 Type 2 diabetes mellitus with other specified complication: Secondary | ICD-10-CM | POA: Diagnosis not present

## 2023-02-24 DIAGNOSIS — Z6832 Body mass index (BMI) 32.0-32.9, adult: Secondary | ICD-10-CM | POA: Diagnosis not present

## 2023-02-24 DIAGNOSIS — E1159 Type 2 diabetes mellitus with other circulatory complications: Secondary | ICD-10-CM

## 2023-02-24 DIAGNOSIS — E669 Obesity, unspecified: Secondary | ICD-10-CM

## 2023-02-24 LAB — BAYER DCA HB A1C WAIVED: HB A1C (BAYER DCA - WAIVED): 6.5 % — ABNORMAL HIGH (ref 4.8–5.6)

## 2023-02-24 NOTE — Progress Notes (Signed)
Subjective:    Patient ID: Colleen Duran, female    DOB: 03/31/1946, 77 y.o.   MRN: [id number]  No chief complaint on file.  PT presents to the office today for chronic follow up.    She has osteopenia and taking calcium and vit d daily. Last dexa scan 01/07/21. Diabetes She presents for her follow-up diabetic visit. She has type 2 diabetes mellitus. Pertinent negatives for diabetes include no blurred vision and no foot paresthesias. Symptoms are stable. Diabetic complications include peripheral neuropathy. Risk factors for coronary artery disease include dyslipidemia, diabetes mellitus, hypertension and sedentary lifestyle. She is following a generally healthy diet. Her overall blood glucose range is 130-140 mg/dl. Eye exam is current.  Hypertension This is a chronic problem. The current episode started more than 1 year ago. The problem has been resolved since onset. The problem is controlled. Associated symptoms include malaise/fatigue. Pertinent negatives include no blurred vision, peripheral edema or shortness of breath. Risk factors for coronary artery disease include diabetes mellitus, dyslipidemia, obesity and sedentary lifestyle. The current treatment provides moderate improvement.  Hyperlipidemia This is a chronic problem. The current episode started more than 1 year ago. Exacerbating diseases include obesity. Pertinent negatives include no shortness of breath. Current antihyperlipidemic treatment includes statins. The current treatment provides moderate improvement of lipids. Risk factors for coronary artery disease include dyslipidemia, diabetes mellitus, hypertension, a sedentary lifestyle and post-menopausal.  Arthritis Presents for follow-up visit. She complains of pain and stiffness. The symptoms have been stable. Affected locations include the left knee, right knee, right MCP and left MCP. Her pain is at a severity of 3/10.      Review of Systems  Constitutional:  Positive for  malaise/fatigue.  Eyes:  Negative for blurred vision.  Respiratory:  Negative for shortness of breath.   Musculoskeletal:  Positive for arthritis and stiffness.  All other systems reviewed and are negative.      Objective:   Physical Exam Vitals reviewed.  Constitutional:      General: She is not in acute distress.    Appearance: She is well-developed. She is obese.  HENT:     Head: Normocephalic and atraumatic.     Right Ear: Tympanic membrane normal.     Left Ear: Tympanic membrane normal.     Ears:     Comments: HOH  Eyes:     Pupils: Pupils are equal, round, and reactive to light.  Neck:     Thyroid: No thyromegaly.  Cardiovascular:     Rate and Rhythm: Normal rate and regular rhythm.     Heart sounds: Normal heart sounds. No murmur heard. Pulmonary:     Effort: Pulmonary effort is normal. No respiratory distress.     Breath sounds: Normal breath sounds. No wheezing.  Abdominal:     General: Bowel sounds are normal. There is no distension.     Palpations: Abdomen is soft.     Tenderness: There is no abdominal tenderness.  Musculoskeletal:        General: No tenderness. Normal range of motion.     Cervical back: Normal range of motion and neck supple.  Skin:    General: Skin is warm and dry.  Neurological:     Mental Status: She is alert and oriented to person, place, and time.     Cranial Nerves: No cranial nerve deficit.     Deep Tendon Reflexes: Reflexes are normal and symmetric.  Psychiatric:        Behavior: Behavior  normal.        Thought Content: Thought content normal.        Judgment: Judgment normal.       BP 135/76   Pulse 76   Temp (!) 97.2 F (36.2 C)   Wt 208 lb 9.6 oz (94.6 kg)   SpO2 99%   BMI 32.67 kg/m      Assessment & Plan:   CATHREN VANDOORN comes in today with chief complaint of Medical Management of Chronic Issues   Diagnosis and orders addressed:  1. Type 2 diabetes mellitus with other specified complication, without  long-term current use of insulin (HCC) - Bayer DCA Hb A1c Waived - CMP14+EGFR  2. Hyperlipidemia associated with type 2 diabetes mellitus (HCC) - CMP14+EGFR  3. Hypertension associated with diabetes (HCC) - CMP14+EGFR  4. Obesity (BMI 30-39.9) - CMP14+EGFR  5. Primary osteoarthritis involving multiple joints - CMP14+EGFR  6. Osteopenia, unspecified location - CMP14+EGFR   Labs pending Continue medications  Health Maintenance reviewed Diet and exercise encouraged  Follow up plan: 6 months   Jannifer Rodney, FNP

## 2023-02-24 NOTE — Patient Instructions (Signed)
Health Maintenance After Age 79 After age 79, you are at a higher risk for certain long-term diseases and infections as well as injuries from falls. Falls are a major cause of broken bones and head injuries in people who are older than age 79. Getting regular preventive care can help to keep you healthy and well. Preventive care includes getting regular testing and making lifestyle changes as recommended by your health care provider. Talk with your health care provider about: Which screenings and tests you should have. A screening is a test that checks for a disease when you have no symptoms. A diet and exercise plan that is right for you. What should I know about screenings and tests to prevent falls? Screening and testing are the best ways to find a health problem early. Early diagnosis and treatment give you the best chance of managing medical conditions that are common after age 79. Certain conditions and lifestyle choices may make you more likely to have a fall. Your health care provider may recommend: Regular vision checks. Poor vision and conditions such as cataracts can make you more likely to have a fall. If you wear glasses, make sure to get your prescription updated if your vision changes. Medicine review. Work with your health care provider to regularly review all of the medicines you are taking, including over-the-counter medicines. Ask your health care provider about any side effects that may make you more likely to have a fall. Tell your health care provider if any medicines that you take make you feel dizzy or sleepy. Strength and balance checks. Your health care provider may recommend certain tests to check your strength and balance while standing, walking, or changing positions. Foot health exam. Foot pain and numbness, as well as not wearing proper footwear, can make you more likely to have a fall. Screenings, including: Osteoporosis screening. Osteoporosis is a condition that causes  the bones to get weaker and break more easily. Blood pressure screening. Blood pressure changes and medicines to control blood pressure can make you feel dizzy. Depression screening. You may be more likely to have a fall if you have a fear of falling, feel depressed, or feel unable to do activities that you used to do. Alcohol use screening. Using too much alcohol can affect your balance and may make you more likely to have a fall. Follow these instructions at home: Lifestyle Do not drink alcohol if: Your health care provider tells you not to drink. If you drink alcohol: Limit how much you have to: 0-1 drink a day for women. 0-2 drinks a day for men. Know how much alcohol is in your drink. In the [place] one drink equals one 12 oz bottle of beer (355 mL), one 5 oz glass of wine (148 mL), or one 1 oz glass of hard liquor (44 mL). Do not use any products that contain nicotine or tobacco. These products include cigarettes, chewing tobacco, and vaping devices, such as e-cigarettes. If you need help quitting, ask your health care provider. Activity  Follow a regular exercise program to stay fit. This will help you maintain your balance. Ask your health care provider what types of exercise are appropriate for you. If you need a cane or walker, use it as recommended by your health care provider. Wear supportive shoes that have nonskid soles. Safety  Remove any tripping hazards, such as rugs, cords, and clutter. Install safety equipment such as grab bars in bathrooms and safety rails on stairs. Keep rooms and walkways   well-lit. General instructions Talk with your health care provider about your risks for falling. Tell your health care provider if: You fall. Be sure to tell your health care provider about all falls, even ones that seem minor. You feel dizzy, tiredness (fatigue), or off-balance. Take over-the-counter and prescription medicines only as told by your health care provider. These include  supplements. Eat a healthy diet and maintain a healthy weight. A healthy diet includes low-fat dairy products, low-fat (lean) meats, and fiber from whole grains, beans, and lots of fruits and vegetables. Stay current with your vaccines. Schedule regular health, dental, and eye exams. Summary Having a healthy lifestyle and getting preventive care can help to protect your health and wellness after age 79. Screening and testing are the best way to find a health problem early and help you avoid having a fall. Early diagnosis and treatment give you the best chance for managing medical conditions that are more common for people who are older than age 79. Falls are a major cause of broken bones and head injuries in people who are older than age 79. Take precautions to prevent a fall at home. Work with your health care provider to learn what changes you can make to improve your health and wellness and to prevent falls. This information is not intended to replace advice given to you by your health care provider. Make sure you discuss any questions you have with your health care provider. Document Revised: 01/14/2021 Document Reviewed: 01/14/2021 Elsevier Patient Education  2024 [employer] Inc[employer]  

## 2023-02-25 LAB — CMP14+EGFR
ALT: 9 IU/L (ref 0–32)
AST: 18 IU/L (ref 0–40)
Albumin: 4.5 g/dL (ref 3.8–4.8)
Alkaline Phosphatase: 71 IU/L (ref 44–121)
BUN/Creatinine Ratio: 16 (ref 12–28)
BUN: 17 mg/dL (ref 8–27)
Bilirubin Total: 0.3 mg/dL (ref 0.0–1.2)
CO2: 18 mmol/L — ABNORMAL LOW (ref 20–29)
Calcium: 9.8 mg/dL (ref 8.7–10.3)
Chloride: 98 mmol/L (ref 96–106)
Creatinine, Ser: 1.06 mg/dL — ABNORMAL HIGH (ref 0.57–1.00)
Globulin, Total: 2.8 g/dL (ref 1.5–4.5)
Glucose: 140 mg/dL — ABNORMAL HIGH (ref 70–99)
Potassium: 4.4 mmol/L (ref 3.5–5.2)
Sodium: 135 mmol/L (ref 134–144)
Total Protein: 7.3 g/dL (ref 6.0–8.5)
eGFR: 54 mL/min/{1.73_m2} — ABNORMAL LOW (ref >59)

## 2023-03-07 ENCOUNTER — Other Ambulatory Visit: Payer: Self-pay | Admitting: Family

## 2023-03-07 DIAGNOSIS — E1159 Type 2 diabetes mellitus with other circulatory complications: Secondary | ICD-10-CM

## 2023-03-17 DIAGNOSIS — M545 Low back pain, unspecified: Secondary | ICD-10-CM | POA: Diagnosis not present

## 2023-03-29 DIAGNOSIS — M545 Low back pain, unspecified: Secondary | ICD-10-CM | POA: Diagnosis not present

## 2023-04-02 DIAGNOSIS — M47816 Spondylosis without myelopathy or radiculopathy, lumbar region: Secondary | ICD-10-CM | POA: Diagnosis not present

## 2023-04-09 ENCOUNTER — Other Ambulatory Visit: Payer: Self-pay | Admitting: Family

## 2023-04-09 DIAGNOSIS — E1159 Type 2 diabetes mellitus with other circulatory complications: Secondary | ICD-10-CM

## 2023-04-15 DIAGNOSIS — M47816 Spondylosis without myelopathy or radiculopathy, lumbar region: Secondary | ICD-10-CM | POA: Diagnosis not present

## 2023-04-24 ENCOUNTER — Encounter: Payer: Self-pay | Admitting: Nurse Practitioner

## 2023-04-24 ENCOUNTER — Ambulatory Visit (INDEPENDENT_AMBULATORY_CARE_PROVIDER_SITE_OTHER): Payer: Medicare HMO | Admitting: Nurse Practitioner

## 2023-04-24 VITALS — BP 178/96 | HR 77 | Temp 97.1°F | Resp 20 | Ht 67.0 in | Wt 206.0 lb

## 2023-04-24 DIAGNOSIS — H029 Unspecified disorder of eyelid: Secondary | ICD-10-CM

## 2023-04-24 MED ORDER — POLYMYXIN B-TRIMETHOPRIM 10000-0.1 UNIT/ML-% OP SOLN: 2.0000 [drp] | OPHTHALMIC | 0 refills | Status: DC

## 2023-04-24 NOTE — Progress Notes (Signed)
   Subjective:    Patient ID: Colleen Duran, female    DOB: [birth year], 77 y.o.   MRN: [id number]   Chief Complaint: eye red  HPI  Noticed a red place n left eye. Nontender, no drainage.  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  Eyes:  Negative for pain.  Respiratory:  Negative for shortness of breath.   Cardiovascular:  Negative for chest pain, palpitations and leg swelling.  Gastrointestinal:  Negative for abdominal pain.  Endocrine: Negative for polydipsia.  Skin:  Negative for rash.  Neurological:  Negative for dizziness, weakness and headaches.  Hematological:  Does not bruise/bleed easily.  All other systems reviewed and are negative.      Objective:   Physical Exam Vitals reviewed.  Constitutional:      Appearance: Normal appearance.  Eyes:     Pupils: Pupils are equal, round, and reactive to light.     Comments: Erythematous 1cm lesion outer canthus of left eye  Cardiovascular:     Rate and Rhythm: Normal rate and regular rhythm.     Heart sounds: Normal heart sounds.  Pulmonary:     Breath sounds: Normal breath sounds.  Skin:    General: Skin is warm.  Neurological:     General: No focal deficit present.     Mental Status: She is alert and oriented to person, place, and time.  Psychiatric:        Mood and Affect: Mood normal.        Behavior: Behavior normal.    BP (!) 178/96   Pulse 77   Temp (!) 97.1 F (36.2 C) (Temporal)   Resp 20   Ht 5\' 7"  (1.702 m)   Wt 206 lb (93.4 kg)   SpO2 99%   BMI 32.26 kg/m          Assessment & Plan:  Colleen Duran in today with chief complaint of No chief complaint on file.   1. Lesion of right lower eyelid Avoid rubbing eye Cool compresses See eye doctor ASAP  Meds ordered this encounter  Medications   trimethoprim-polymyxin b (POLYTRIM) ophthalmic solution    Sig: Place 2 drops into both eyes every 4 (four) hours.    Dispense:  10 mL    Refill:  0    Order Specific Question:   Supervising Provider    Answer:   Arville Care A [[id number]]       The above assessment and management plan was discussed with the patient. The patient verbalized understanding of and has agreed to the management plan. Patient is aware to call the clinic if symptoms persist or worsen. Patient is aware when to return to the clinic for a follow-up visit. Patient educated on when it is appropriate to go to the emergency department.   Mary-Margaret Daphine Deutscher, FNP

## 2023-04-27 DIAGNOSIS — M791 Myalgia, unspecified site: Secondary | ICD-10-CM | POA: Diagnosis not present

## 2023-04-27 DIAGNOSIS — M47896 Other spondylosis, lumbar region: Secondary | ICD-10-CM | POA: Diagnosis not present

## 2023-04-27 DIAGNOSIS — M545 Low back pain, unspecified: Secondary | ICD-10-CM | POA: Diagnosis not present

## 2023-04-30 ENCOUNTER — Ambulatory Visit: Payer: Medicare HMO | Admitting: Podiatry

## 2023-05-10 ENCOUNTER — Other Ambulatory Visit: Payer: Self-pay | Admitting: Family

## 2023-05-10 DIAGNOSIS — E1159 Type 2 diabetes mellitus with other circulatory complications: Secondary | ICD-10-CM

## 2023-05-17 ENCOUNTER — Other Ambulatory Visit: Payer: Self-pay | Admitting: Family

## 2023-05-17 DIAGNOSIS — E119 Type 2 diabetes mellitus without complications: Secondary | ICD-10-CM

## 2023-05-26 ENCOUNTER — Telehealth: Payer: Self-pay

## 2023-05-26 NOTE — Telephone Encounter (Signed)
Lmtcb to get most recent blood pressure reading

## 2023-05-27 ENCOUNTER — Other Ambulatory Visit: Payer: Self-pay | Admitting: Family

## 2023-05-27 DIAGNOSIS — I152 Hypertension secondary to endocrine disorders: Secondary | ICD-10-CM

## 2023-05-27 NOTE — Telephone Encounter (Signed)
Documented on quality sheet

## 2023-06-19 NOTE — Telephone Encounter (Signed)
err

## 2023-07-10 ENCOUNTER — Ambulatory Visit: Payer: Medicare HMO | Admitting: Family

## 2023-07-14 DIAGNOSIS — Z961 Presence of intraocular lens: Secondary | ICD-10-CM | POA: Diagnosis not present

## 2023-07-14 DIAGNOSIS — H524 Presbyopia: Secondary | ICD-10-CM | POA: Diagnosis not present

## 2023-07-14 DIAGNOSIS — H5203 Hypermetropia, bilateral: Secondary | ICD-10-CM | POA: Diagnosis not present

## 2023-07-14 DIAGNOSIS — E119 Type 2 diabetes mellitus without complications: Secondary | ICD-10-CM | POA: Diagnosis not present

## 2023-07-14 DIAGNOSIS — H35363 Drusen (degenerative) of macula, bilateral: Secondary | ICD-10-CM | POA: Diagnosis not present

## 2023-08-06 ENCOUNTER — Other Ambulatory Visit: Payer: Self-pay | Admitting: Family

## 2023-08-06 DIAGNOSIS — E1169 Type 2 diabetes mellitus with other specified complication: Secondary | ICD-10-CM

## 2023-08-21 ENCOUNTER — Other Ambulatory Visit: Payer: Self-pay | Admitting: Family

## 2023-08-21 DIAGNOSIS — E1159 Type 2 diabetes mellitus with other circulatory complications: Secondary | ICD-10-CM

## 2023-08-27 ENCOUNTER — Ambulatory Visit: Payer: Medicare HMO | Admitting: Family

## 2023-08-30 ENCOUNTER — Other Ambulatory Visit: Payer: Self-pay | Admitting: Family

## 2023-08-30 DIAGNOSIS — E1159 Type 2 diabetes mellitus with other circulatory complications: Secondary | ICD-10-CM

## 2023-08-31 ENCOUNTER — Ambulatory Visit: Payer: Medicare HMO | Admitting: Family

## 2023-08-31 DIAGNOSIS — E119 Type 2 diabetes mellitus without complications: Secondary | ICD-10-CM

## 2023-08-31 DIAGNOSIS — E785 Hyperlipidemia, unspecified: Secondary | ICD-10-CM

## 2023-08-31 DIAGNOSIS — E1169 Type 2 diabetes mellitus with other specified complication: Secondary | ICD-10-CM

## 2023-08-31 DIAGNOSIS — I152 Hypertension secondary to endocrine disorders: Secondary | ICD-10-CM

## 2023-09-29 ENCOUNTER — Ambulatory Visit: Payer: Medicare HMO | Admitting: Family

## 2023-09-29 ENCOUNTER — Ambulatory Visit (INDEPENDENT_AMBULATORY_CARE_PROVIDER_SITE_OTHER): Payer: Medicare HMO

## 2023-09-29 ENCOUNTER — Encounter: Payer: Self-pay | Admitting: Family

## 2023-09-29 VITALS — BP 136/77 | HR 70 | Temp 97.0°F | Ht 67.0 in | Wt 213.0 lb

## 2023-09-29 DIAGNOSIS — Z780 Asymptomatic menopausal state: Secondary | ICD-10-CM

## 2023-09-29 DIAGNOSIS — E1159 Type 2 diabetes mellitus with other circulatory complications: Secondary | ICD-10-CM | POA: Diagnosis not present

## 2023-09-29 DIAGNOSIS — Z6833 Body mass index (BMI) 33.0-33.9, adult: Secondary | ICD-10-CM | POA: Diagnosis not present

## 2023-09-29 DIAGNOSIS — M150 Primary generalized (osteo)arthritis: Secondary | ICD-10-CM | POA: Diagnosis not present

## 2023-09-29 DIAGNOSIS — M8588 Other specified disorders of bone density and structure, other site: Secondary | ICD-10-CM | POA: Diagnosis not present

## 2023-09-29 DIAGNOSIS — E785 Hyperlipidemia, unspecified: Secondary | ICD-10-CM | POA: Diagnosis not present

## 2023-09-29 DIAGNOSIS — Z7984 Long term (current) use of oral hypoglycemic drugs: Secondary | ICD-10-CM

## 2023-09-29 DIAGNOSIS — E669 Obesity, unspecified: Secondary | ICD-10-CM

## 2023-09-29 DIAGNOSIS — M8580 Other specified disorders of bone density and structure, unspecified site: Secondary | ICD-10-CM | POA: Diagnosis not present

## 2023-09-29 DIAGNOSIS — I152 Hypertension secondary to endocrine disorders: Secondary | ICD-10-CM | POA: Diagnosis not present

## 2023-09-29 DIAGNOSIS — Z0001 Encounter for general adult medical examination with abnormal findings: Secondary | ICD-10-CM

## 2023-09-29 DIAGNOSIS — Z0000 Encounter for general adult medical examination without abnormal findings: Secondary | ICD-10-CM

## 2023-09-29 DIAGNOSIS — E1169 Type 2 diabetes mellitus with other specified complication: Secondary | ICD-10-CM

## 2023-09-29 LAB — BAYER DCA HB A1C WAIVED: HB A1C (BAYER DCA - WAIVED): 6.3 % — ABNORMAL HIGH (ref 4.8–5.6)

## 2023-09-29 NOTE — Patient Instructions (Signed)
Osteopenia  Osteopenia is a loss of thickness (density) inside the bones. Another name for osteopenia is low bone mass. Mild osteopenia is a normal part of aging. It is not a disease, and it does not cause symptoms. However, if you have osteopenia and continue to lose bone mass, you could develop a condition that causes the bones to become thin and break more easily (osteoporosis). Osteoporosis can cause you to lose some height, have back pain, and have a stooped posture. Although osteopenia is not a disease, making changes to your lifestyle and diet can help to prevent osteopenia from developing into osteoporosis. What are the causes? Osteopenia is caused by loss of calcium in the bones. Bones are constantly changing. Old bone cells are continually being replaced with new bone cells. This process builds new bone. The mineral calcium is needed to build new bone and maintain bone density. Bone density is usually highest around age 24. After that, most people's bodies cannot replace all the bone they have lost with new bone. What increases the risk? You are more likely to develop this condition if: You are older than age 80. You are a woman who went through menopause early. You have a long illness that keeps you in bed. You do not get enough exercise. You lack certain nutrients (malnutrition). You have an overactive thyroid gland (hyperthyroidism). You use products that contain nicotine or tobacco, such as cigarettes, e-cigarettes and chewing tobacco, or you drink a lot of alcohol. You are taking medicines that weaken the bones, such as steroids. What are the signs or symptoms? This condition does not cause any symptoms. You may have a slightly higher risk for bone breaks (fractures), so getting fractures more easily than normal may be an indication of osteopenia. How is this diagnosed? This condition may be diagnosed based on an X-ray exam that measures bone density (dual-energy X-ray  absorptiometry, or DEXA). This test can measure bone density in your hips, spine, and wrists. Osteopenia has no symptoms, so this condition is usually diagnosed after a routine bone density screening test is done for osteoporosis. This routine screening is usually done for: Women who are age 11 or older. Men who are age 24 or older. If you have risk factors for osteopenia, you may have the screening test at an earlier age. How is this treated? Making dietary and lifestyle changes can lower your risk for osteoporosis. If you have severe osteopenia that is close to becoming osteoporosis, this condition can be treated with medicines and dietary supplements such as calcium and vitamin D. These supplements help to rebuild bone density. Follow these instructions at home: Eating and drinking Eat a diet that is high in calcium and vitamin D. Calcium is found in dairy products, beans, salmon, and leafy green vegetables like spinach and broccoli. Look for foods that have vitamin D and calcium added to them (fortified foods), such as orange juice, cereal, and bread.  Lifestyle Do 30 minutes or more of a weight-bearing exercise every day, such as walking, jogging, or playing a sport. These types of exercises strengthen the bones. Do not use any products that contain nicotine or tobacco, such as cigarettes, e-cigarettes, and chewing tobacco. If you need help quitting, ask your health care provider. Do not drink alcohol if: Your health care provider tells you not to drink. You are pregnant, may be pregnant, or are planning to become pregnant. If you drink alcohol: Limit how much you use to: 0-1 drink a day for women. 0-2  drinks a day for men. Be aware of how much alcohol is in your drink. In the [place] one drink equals one 12 oz bottle of beer (355 mL), one 5 oz glass of wine (148 mL), or one 1 oz glass of hard liquor (44 mL). General instructions Take over-the-counter and prescription medicines only as  told by your health care provider. These include vitamins and supplements. Take precautions at home to lower your risk of falling, such as: Keeping rooms well-lit and free of clutter, such as cords. Installing safety rails on stairs. Using rubber mats in the bathroom or other areas that are often wet or slippery. Keep all follow-up visits. This is important. Contact a health care provider if: You have not had a bone density screening for osteoporosis and you are: A woman who is age 80 or older. A man who is age 80 or older. You are a postmenopausal woman who has not had a bone density screening for osteoporosis. You are older than age 50 and you want to know if you should have bone density screening for osteoporosis. Summary Osteopenia is a loss of thickness (density) inside the bones. Another name for osteopenia is low bone mass. Osteopenia is not a disease, but it may increase your risk for a condition that causes the bones to become thin and break more easily (osteoporosis). You may be at risk for osteopenia if you are older than age 80 or if you are a woman who went through early menopause. Osteopenia does not cause any symptoms, but it can be diagnosed with a bone density screening test. Dietary and lifestyle changes are the first treatment for osteopenia. These may lower your risk for osteoporosis. This information is not intended to replace advice given to you by your health care provider. Make sure you discuss any questions you have with your health care provider. Document Revised: 05/12/2023 Document Reviewed: 05/12/2023 Elsevier Patient Education  2024 [employer].

## 2023-09-29 NOTE — Progress Notes (Signed)
Subjective:    Patient ID: Colleen Duran, female    DOB: [birth year], 78 y.o.   MRN: [id number]  Chief Complaint  Patient presents with   Medical Management of Chronic Issues    DM. Patient is not fasting.    PT presents to the office today for CPE and chronic follow up.    She has osteopenia and taking calcium and vit d daily. Last dexa scan 01/07/21. Will do one today.  Diabetes She presents for her follow-up diabetic visit. She has type 2 diabetes mellitus. Pertinent negatives for diabetes include no blurred vision and no foot paresthesias. Symptoms are stable. Diabetic complications include peripheral neuropathy. Risk factors for coronary artery disease include dyslipidemia, diabetes mellitus, hypertension and sedentary lifestyle. She is following a generally healthy diet. Her overall blood glucose range is 130-140 mg/dl. Eye exam is current.  Hypertension This is a chronic problem. The current episode started more than 1 year ago. The problem has been resolved since onset. The problem is controlled. Associated symptoms include malaise/fatigue. Pertinent negatives include no blurred vision, peripheral edema or shortness of breath. Risk factors for coronary artery disease include diabetes mellitus, dyslipidemia, obesity and sedentary lifestyle. The current treatment provides moderate improvement.  Hyperlipidemia This is a chronic problem. The current episode started more than 1 year ago. Exacerbating diseases include obesity. Pertinent negatives include no shortness of breath. Current antihyperlipidemic treatment includes ezetimibe. The current treatment provides mild improvement of lipids. Risk factors for coronary artery disease include dyslipidemia, diabetes mellitus, hypertension, a sedentary lifestyle and post-menopausal.  Arthritis Presents for follow-up visit. She complains of pain and stiffness. The symptoms have been stable. Affected locations include the left knee, right knee, right  MCP and left MCP. Her pain is at a severity of 5/10.      Review of Systems  Constitutional:  Positive for malaise/fatigue.  Eyes:  Negative for blurred vision.  Respiratory:  Negative for shortness of breath.   Musculoskeletal:  Positive for stiffness.  All other systems reviewed and are negative.      Objective:   Physical Exam Vitals reviewed.  Constitutional:      General: She is not in acute distress.    Appearance: She is well-developed. She is obese.  HENT:     Head: Normocephalic and atraumatic.     Right Ear: Tympanic membrane normal.     Left Ear: Tympanic membrane normal.     Ears:     Comments: HOH, hearing aid in place  Eyes:     Pupils: Pupils are equal, round, and reactive to light.  Neck:     Thyroid: No thyromegaly.  Cardiovascular:     Rate and Rhythm: Normal rate and regular rhythm.     Heart sounds: Normal heart sounds. No murmur heard. Pulmonary:     Effort: Pulmonary effort is normal. No respiratory distress.     Breath sounds: Normal breath sounds. No wheezing.  Abdominal:     General: Bowel sounds are normal. There is no distension.     Palpations: Abdomen is soft.     Tenderness: There is no abdominal tenderness.  Musculoskeletal:        General: No tenderness. Normal range of motion.     Cervical back: Normal range of motion and neck supple.  Skin:    General: Skin is warm and dry.  Neurological:     Mental Status: She is alert and oriented to person, place, and time.     Cranial Nerves:  No cranial nerve deficit.     Deep Tendon Reflexes: Reflexes are normal and symmetric.  Psychiatric:        Behavior: Behavior normal.        Thought Content: Thought content normal.        Judgment: Judgment normal.       BP 136/77   Pulse 70   Temp (!) 97 F (36.1 C) (Temporal)   Ht 5\' 7"  (1.702 m)   Wt 213 lb (96.6 kg)   SpO2 98%   BMI 33.36 kg/m      Assessment & Plan:   VEEHA BACKES comes in today with chief complaint of Medical  Management of Chronic Issues (DM. Patient is not fasting. )   Diagnosis and orders addressed:  1. Annual physical exam (Primary) - Microalbumin / creatinine urine ratio - Bayer DCA Hb A1c Waived - CMP14+EGFR - CBC with Differential/Platelet - Lipid panel  2. Type 2 diabetes mellitus with other specified complication, without long-term current use of insulin (HCC) - Microalbumin / creatinine urine ratio - Bayer DCA Hb A1c Waived - CMP14+EGFR - CBC with Differential/Platelet  3. Osteopenia, unspecified location  - CMP14+EGFR - CBC with Differential/Platelet - DG [facility] DEXA  4. Obesity (BMI 30-39.9)  - CMP14+EGFR - CBC with Differential/Platelet  5. Hyperlipidemia associated with type 2 diabetes mellitus (HCC) - CMP14+EGFR - CBC with Differential/Platelet - Lipid panel  6. Primary osteoarthritis involving multiple joints - CMP14+EGFR - CBC with Differential/Platelet  7. Hypertension associated with diabetes (HCC) - CMP14+EGFR - CBC with Differential/Platelet  8. Post-menopause - DG [facility] DEXA   Labs pending Continue medications  Health Maintenance reviewed Diet and exercise encouraged  Follow up plan: 6 months   Jannifer Rodney, FNP

## 2023-09-30 LAB — CMP14+EGFR
ALT: 17 [IU]/L (ref 0–32)
AST: 17 [IU]/L (ref 0–40)
Albumin: 4.5 g/dL (ref 3.8–4.8)
Alkaline Phosphatase: 81 [IU]/L (ref 44–121)
BUN/Creatinine Ratio: 15 (ref 12–28)
BUN: 17 mg/dL (ref 8–27)
Bilirubin Total: 0.3 mg/dL (ref 0.0–1.2)
CO2: 23 mmol/L (ref 20–29)
Calcium: 10 mg/dL (ref 8.7–10.3)
Chloride: 95 mmol/L — ABNORMAL LOW (ref 96–106)
Creatinine, Ser: 1.14 mg/dL — ABNORMAL HIGH (ref 0.57–1.00)
Globulin, Total: 2.9 g/dL (ref 1.5–4.5)
Glucose: 96 mg/dL (ref 70–99)
Potassium: 4.4 mmol/L (ref 3.5–5.2)
Sodium: 135 mmol/L (ref 134–144)
Total Protein: 7.4 g/dL (ref 6.0–8.5)
eGFR: 49 mL/min/{1.73_m2} — ABNORMAL LOW (ref >59)

## 2023-09-30 LAB — MICROALBUMIN / CREATININE URINE RATIO
Creatinine, Urine: 60.5 mg/dL
Microalb/Creat Ratio: 173 mg/g{creat} — ABNORMAL HIGH (ref 0–29)
Microalbumin, Urine: 104.4 ug/mL

## 2023-09-30 LAB — CBC WITH DIFFERENTIAL/PLATELET
Basophils Absolute: 0 10*3/uL (ref 0.0–0.2)
Basos: 0 %
EOS (ABSOLUTE): 0.2 10*3/uL (ref 0.0–0.4)
Eos: 5 %
Hematocrit: 39 % (ref 34.0–46.6)
Hemoglobin: 12.9 g/dL (ref 11.1–15.9)
Immature Grans (Abs): 0 10*3/uL (ref 0.0–0.1)
Immature Granulocytes: 0 %
Lymphocytes Absolute: 2.2 10*3/uL (ref 0.7–3.1)
Lymphs: 49 %
MCH: 29.5 pg (ref 26.6–33.0)
MCHC: 33.1 g/dL (ref 31.5–35.7)
MCV: 89 fL (ref 79–97)
Monocytes Absolute: 0.4 10*3/uL (ref 0.1–0.9)
Monocytes: 8 %
Neutrophils Absolute: 1.7 10*3/uL (ref 1.4–7.0)
Neutrophils: 38 %
Platelets: 279 10*3/uL (ref 150–450)
RBC: 4.37 x10E6/uL (ref 3.77–5.28)
RDW: 13.6 % (ref 11.7–15.4)
WBC: 4.5 10*3/uL (ref 3.4–10.8)

## 2023-09-30 LAB — LIPID PANEL
Chol/HDL Ratio: 3.3 {ratio} (ref 0.0–4.4)
Cholesterol, Total: 225 mg/dL — ABNORMAL HIGH (ref 100–199)
HDL: 68 mg/dL (ref >39)
LDL Chol Calc (NIH): 137 mg/dL — ABNORMAL HIGH (ref 0–99)
Triglycerides: 113 mg/dL (ref 0–149)
VLDL Cholesterol Cal: 20 mg/dL (ref 5–40)

## 2023-10-01 ENCOUNTER — Other Ambulatory Visit: Payer: Self-pay | Admitting: Family

## 2023-10-01 DIAGNOSIS — M85852 Other specified disorders of bone density and structure, left thigh: Secondary | ICD-10-CM | POA: Diagnosis not present

## 2023-10-01 DIAGNOSIS — Z780 Asymptomatic menopausal state: Secondary | ICD-10-CM | POA: Diagnosis not present

## 2023-10-01 DIAGNOSIS — M85851 Other specified disorders of bone density and structure, right thigh: Secondary | ICD-10-CM | POA: Diagnosis not present

## 2023-10-01 MED ORDER — ATORVASTATIN CALCIUM 20 MG PO TABS: 20.0000 mg | ORAL_TABLET | Freq: Every day | ORAL | 3 refills | Status: DC

## 2023-10-03 ENCOUNTER — Other Ambulatory Visit: Payer: Self-pay | Admitting: Family

## 2023-10-03 DIAGNOSIS — I152 Hypertension secondary to endocrine disorders: Secondary | ICD-10-CM

## 2023-11-01 ENCOUNTER — Other Ambulatory Visit: Payer: Self-pay | Admitting: Family

## 2023-11-01 DIAGNOSIS — I152 Hypertension secondary to endocrine disorders: Secondary | ICD-10-CM

## 2023-11-09 ENCOUNTER — Ambulatory Visit: Payer: Medicare HMO

## 2023-11-09 VITALS — Ht 67.0 in | Wt 213.0 lb

## 2023-11-09 DIAGNOSIS — Z0000 Encounter for general adult medical examination without abnormal findings: Secondary | ICD-10-CM | POA: Diagnosis not present

## 2023-11-09 NOTE — Patient Instructions (Signed)
 Ms. Colleen Duran , Thank you for taking time to come for your Medicare Wellness Visit. I appreciate your ongoing commitment to your health goals. Please review the following plan we discussed and let me know if I can assist you in the future.   Referrals/Orders/Follow-Ups/Clinician Recommendations: Aim for 30 minutes of exercise or brisk walking, 6-8 glasses of water, and 5 servings of fruits and vegetables each day.   This is a list of the screening recommended for you and due dates:  Health Maintenance  Topic Date Due   COVID-19 Vaccine (1) Never done   Complete foot exam   10/03/2023   Hemoglobin A1C  03/28/2024   Eye exam for diabetics  07/13/2024   Yearly kidney function blood test for diabetes  09/28/2024   Yearly kidney health urinalysis for diabetes  09/28/2024   Medicare Annual Wellness Visit  11/08/2024   Colon Cancer Screening  05/10/2025   DEXA scan (bone density measurement)  09/30/2025   DTaP/Tdap/Td vaccine (2 - Td or Tdap) 10/31/2027   Pneumonia Vaccine  Completed   Flu Shot  Completed   Hepatitis C Screening  Completed   Zoster (Shingles) Vaccine  Completed   HPV Vaccine  Aged Out    Advanced directives: (ACP Link)Information on Advanced Care Planning can be found at [facility] Advance Health Care Directives Advance Health Care Directives ([email or web address]   Next Medicare Annual Wellness Visit scheduled for next year: Yes

## 2023-11-09 NOTE — Progress Notes (Signed)
 Subjective:   Colleen Duran is a 78 y.o. who presents for a Medicare Wellness preventive visit.  Visit Complete: Virtual I connected with  Colleen Duran on 11/09/23 by a audio enabled telemedicine application and verified that I am speaking with the correct person using two identifiers.  Patient Location: Home  Provider Location: Home Office  I discussed the limitations of evaluation and management by telemedicine. The patient expressed understanding and agreed to proceed.  Vital Signs: Because this visit was a virtual/telehealth visit, some criteria may be missing or patient reported. Any vitals not documented were not able to be obtained and vitals that have been documented are patient reported.  VideoDeclined- This patient declined Librarian, academic. Therefore the visit was completed with audio only.  AWV Questionnaire: No: Patient Medicare AWV questionnaire was not completed prior to this visit.  Cardiac Risk Factors include: advanced age (>67men, >26 women);dyslipidemia;hypertension;diabetes mellitus     Objective:    Today's Vitals   11/09/23 1438  Weight: 213 lb (96.6 kg)  Height: 5\' 7"  (1.702 m)   Body mass index is 33.36 kg/m.     11/09/2023    2:48 PM 11/06/2022    3:29 PM 08/21/2020   10:41 AM 04/22/2019    4:04 PM 04/22/2019    2:40 PM 12/30/2017   11:02 AM 11/05/2015    8:14 AM  Advanced Directives  Does Patient Have a Medical Advance Directive? No Yes No Yes Yes Yes Yes;No  Type of Special educational needs teacher of [place];Living will  Living will Living will Living will;Healthcare Power of Attorney   Does patient want to make changes to medical advance directive?  No - Patient declined  No - Patient declined No - Patient declined No - Patient declined No - Patient declined  Copy of Healthcare Power of Attorney in Chart?  Yes - validated most recent copy scanned in chart (See row information)    No - copy requested   Would patient  like information on creating a medical advance directive? Yes (MAU/Ambulatory/Procedural Areas - Information given)     No - Patient declined     Current Medications (verified) Outpatient Encounter Medications as of 11/09/2023  Medication Sig   acetaminophen (TYLENOL) 500 MG tablet Take 1,000 mg by mouth every 6 (six) hours as needed for mild pain.   amLODipine (NORVASC) 10 MG tablet TAKE 1 TABLET BY MOUTH EVERY DAY   aspirin EC 81 MG tablet Take 1 tablet (81 mg total) by mouth daily.   atorvastatin (LIPITOR) 20 MG tablet Take 1 tablet (20 mg total) by mouth daily.   busPIRone (BUSPAR) 10 MG tablet Take 10 mg by mouth 2 (two) times daily.   ezetimibe (ZETIA) 10 MG tablet TAKE 1 TABLET BY MOUTH EVERY DAY   ferrous sulfate 324 MG TBEC Take 324 mg by mouth daily.   metFORMIN (GLUCOPHAGE) 500 MG tablet TAKE 1 TABLET BY MOUTH 2 TIMES DAILY WITH A MEAL.   metoprolol succinate (TOPROL-XL) 25 MG 24 hr tablet TAKE 1 TABLET (25 MG TOTAL) BY MOUTH DAILY.   Multiple Vitamin (MULTIVITAMIN WITH MINERALS) TABS tablet Take 1 tablet by mouth daily.   olmesartan-hydrochlorothiazide (BENICAR HCT) 40-25 MG tablet TAKE 1 TABLET BY MOUTH EVERY DAY   spironolactone (ALDACTONE) 25 MG tablet TAKE 1/2 TABLET BY MOUTH DAILY   No facility-administered encounter medications on file as of 11/09/2023.    Allergies (verified) Patient has no known allergies.   History: Past Medical History:  Diagnosis Date   Anemia    Cataract    Bi-lateral, [facility] [place]   Diabetes mellitus without complication (HCC)    TYPE 2   High cholesterol    Hypertension    RHA (rheumatoid arthritis) (HCC)    RA   Wears glasses    Past Surgical History:  Procedure Laterality Date   ABDOMINAL HYSTERECTOMY     BUNIONECTOMY     bilateral feet   CATARACT EXTRACTION, BILATERAL     CESAREAN SECTION     COLONOSCOPY N/A 05/11/2015   Procedure: COLONOSCOPY;  Surgeon: West Bali, MD;  Location: [facility];  Service:  Endoscopy;  Laterality: N/A;  2:15 PM   REVERSE SHOULDER ARTHROPLASTY Left 08/23/2020   Procedure: REVERSE SHOULDER ARTHROPLASTY;  Surgeon: Yolonda Kida, MD;  Location: [facility] ORS;  Service: Orthopedics;  Laterality: Left;  2.5 hrs   TOTAL KNEE ARTHROPLASTY Left 07/24/2014   Procedure: LEFT TOTAL KNEE ARTHROPLASTY;  Surgeon: Dannielle Huh, MD;  Location: [facility] OR;  Service: Orthopedics;  Laterality: Left;   TOTAL KNEE ARTHROPLASTY Right 10/08/2015   Procedure: RIGHT TOTAL KNEE ARTHROPLASTY;  Surgeon: Dannielle Huh, MD;  Location: [facility] OR;  Service: Orthopedics;  Laterality: Right;   Family History  Problem Relation Age of Onset   Hypertension Mother    Stroke Mother    Cancer Sister        lung   Healthy Daughter    Healthy Daughter    Cancer Brother    Mental retardation Brother    Breast cancer Neg Hx    Social History   Socioeconomic History   Marital status: Widowed    Spouse name: Not on file   Number of children: 2   Years of education: 75   Highest education level: 12th grade  Occupational History   Occupation: Retired    Comment: Retail buyer  Tobacco Use   Smoking status: Never   Smokeless tobacco: Never  Vaping Use   Vaping status: Never Used  Substance and Sexual Activity   Alcohol use: No   Drug use: No   Sexual activity: Not Currently    Birth control/protection: Surgical  Other Topics Concern   Not on file  Social History Narrative   [name] lives with her daughter and grandchildren.    Social Drivers of Corporate investment banker Strain: Low Risk  (11/09/2023)   Overall Financial Resource Strain (CARDIA)    Difficulty of Paying Living Expenses: Not hard at all  Food Insecurity: No Food Insecurity (11/09/2023)   Hunger Vital Sign    Worried About Running Out of Food in the Last Year: Never true    Ran Out of Food in the Last Year: Never true  Transportation Needs: No Transportation Needs (11/09/2023)   PRAPARE - Scientist, research (physical sciences) (Medical): No    Lack of Transportation (Non-Medical): No  Physical Activity: Insufficiently Active (11/09/2023)   Exercise Vital Sign    Days of Exercise per Week: 3 days    Minutes of Exercise per Session: 30 min  Stress: No Stress Concern Present (11/09/2023)   [name]    Feeling of Stress : Not at all  Social Connections: Moderately Isolated (11/09/2023)   Social Connection and Isolation Panel [NHANES]    Frequency of Communication with Friends and Family: More than three times a week    Frequency of Social Gatherings with Friends and Family: Three  times a week    Attends Religious Services: More than 4 times per year    Active Member of Clubs or Organizations: No    Attends Banker Meetings: Never    Marital Status: Widowed    Tobacco Counseling Counseling given: Not Answered    Clinical Intake:  Pre-visit preparation completed: Yes  Pain : No/denies pain     Diabetes: No  How often do you need to have someone help you when you read instructions, pamphlets, or other written materials from your doctor or pharmacy?: 1 - Never  Interpreter Needed?: No  Information entered by :: Kandis Fantasia LPN   Activities of Daily Living     11/09/2023    2:48 PM  In your present state of health, do you have any difficulty performing the following activities:  Hearing? 0  Vision? 0  Difficulty concentrating or making decisions? 0  Walking or climbing stairs? 0  Dressing or bathing? 0  Doing errands, shopping? 0  Preparing Food and eating ? N  Using the Toilet? N  In the past six months, have you accidently leaked urine? N  Do you have problems with loss of bowel control? N  Managing your Medications? N  Managing your Finances? N  Housekeeping or managing your Housekeeping? N    Patient Care Team: Junie Spencer, FNP as PCP - General (Family Medicine) Delora Fuel, OD  (Optometry)  Indicate any recent Medical Services you may have received from other than [facility] providers in the past year (date may be approximate).     Assessment:   This is a routine wellness examination for Mica.  Hearing/Vision screen Hearing Screening - Comments:: Hearing loss;   Vision Screening - Comments:: Wears rx glasses - up to date with routine eye exams with [facility]. Wyn Forster     Goals Addressed   None    Depression Screen     11/09/2023    2:46 PM 09/29/2023   10:32 AM 04/24/2023    3:45 PM 02/24/2023    9:40 AM 11/06/2022    3:27 PM 10/17/2022    2:58 PM 07/21/2022    1:45 PM  PHQ 2/9 Scores  PHQ - 2 Score 0 0 0 0 0 0 0  PHQ- 9 Score 0 0 0 0 0 0 0    Fall Risk     11/09/2023    2:48 PM 09/29/2023   10:32 AM 04/24/2023    3:45 PM 11/06/2022    3:26 PM 07/21/2022    1:45 PM  Fall Risk   Falls in the past year? 0 0 0 0 1  Number falls in past yr: 0 0  0   Injury with Fall? 0 0  0   Risk for fall due to : No Fall Risks No Fall Risks  No Fall Risks   Follow up Falls prevention discussed;Education provided;Falls evaluation completed Falls evaluation completed  Falls prevention discussed     MEDICARE RISK AT HOME:  Medicare Risk at Home Any stairs in or around the home?: No If so, are there any without handrails?: No Home free of loose throw rugs in walkways, pet beds, electrical cords, etc?: Yes Adequate lighting in your home to reduce risk of falls?: Yes Life alert?: No Use of a cane, walker or w/c?: No Grab bars in the bathroom?: Yes Shower chair or bench in shower?: No Elevated toilet seat or a handicapped toilet?: Yes  TIMED UP AND GO:  Was the test performed?  No  Cognitive Function: 6CIT completed    12/30/2017   11:15 AM  MMSE - Mini Mental State Exam  Orientation to time 5  Orientation to Place 5  Registration 3  Attention/ Calculation 1  Recall 3  Language- name 2 objects 2  Language- repeat 1  Language- follow 3 step command 3  Language-  read & follow direction 1  Write a sentence 1  Copy design 1  Total score 26        11/09/2023    2:48 PM 11/06/2022    3:29 PM 04/22/2019    2:44 PM  6CIT Screen  What Year? 0 points 0 points 0 points  What month? 0 points 0 points 0 points  What time? 0 points 0 points 0 points  Count back from 20 0 points 0 points 2 points  Months in reverse 0 points 0 points 2 points  Repeat phrase 0 points 0 points 2 points  Total Score 0 points 0 points 6 points    Immunizations Immunization History  Administered Date(s) Administered   Fluad Quad(high Dose 65+) 05/30/2019, 05/14/2022   Influenza, High Dose Seasonal PF 07/21/2017, 06/16/2018   Influenza-Unspecified 06/06/2014, 06/09/2023   Pneumococcal Conjugate-13 12/02/2016   Pneumococcal Polysaccharide-23 10/30/2017   Tdap 10/30/2017   Zoster Recombinant(Shingrix) 12/30/2017, 09/07/2019    Screening Tests Health Maintenance  Topic Date Due   COVID-19 Vaccine (1) Never done   FOOT EXAM  10/03/2023   HEMOGLOBIN A1C  03/28/2024   OPHTHALMOLOGY EXAM  07/13/2024   Diabetic kidney evaluation - eGFR measurement  09/28/2024   Diabetic kidney evaluation - Urine ACR  09/28/2024   Medicare Annual Wellness (AWV)  11/08/2024   Colonoscopy  05/10/2025   DEXA SCAN  09/30/2025   DTaP/Tdap/Td (2 - Td or Tdap) 10/31/2027   Pneumonia Vaccine 37+ Years old  Completed   INFLUENZA VACCINE  Completed   Hepatitis C Screening  Completed   Zoster Vaccines- Shingrix  Completed   HPV VACCINES  Aged Out    Health Maintenance  Health Maintenance Due  Topic Date Due   COVID-19 Vaccine (1) Never done   FOOT EXAM  10/03/2023   Health Maintenance Items Addressed: Mammogram scheduled  Additional Screening:  Vision Screening: Recommended annual ophthalmology exams for early detection of glaucoma and other disorders of the eye.  Dental Screening: Recommended annual dental exams for proper oral hygiene  Community Resource Referral / Chronic Care  Management: CRR required this visit?  No   CCM required this visit?  No     Plan:     I have personally reviewed and noted the following in the patient's chart:   Medical and social history Use of alcohol, tobacco or illicit drugs  Current medications and supplements including opioid prescriptions. Patient is not currently taking opioid prescriptions. Functional ability and status Nutritional status Physical activity Advanced directives List of other physicians Hospitalizations, surgeries, and ER visits in previous 12 months Vitals Screenings to include cognitive, depression, and falls Referrals and appointments  In addition, I have reviewed and discussed with patient certain preventive protocols, quality metrics, and best practice recommendations. A written personalized care plan for preventive services as well as general preventive health recommendations were provided to patient.     Kandis Fantasia [place], [place]   09/13/1094   After Visit Summary: (MyChart) Due to this being a telephonic visit, the after visit summary with patients personalized plan was offered to patient via MyChart   Notes: Nothing significant to report  at this time.

## 2023-11-10 ENCOUNTER — Other Ambulatory Visit: Payer: Self-pay | Admitting: Family

## 2023-11-10 DIAGNOSIS — E1169 Type 2 diabetes mellitus with other specified complication: Secondary | ICD-10-CM

## 2023-11-10 DIAGNOSIS — E119 Type 2 diabetes mellitus without complications: Secondary | ICD-10-CM

## 2023-11-15 ENCOUNTER — Other Ambulatory Visit: Payer: Self-pay | Admitting: Family

## 2023-11-15 DIAGNOSIS — E1159 Type 2 diabetes mellitus with other circulatory complications: Secondary | ICD-10-CM

## 2023-11-18 ENCOUNTER — Other Ambulatory Visit: Payer: Self-pay | Admitting: Podiatry

## 2023-11-30 DIAGNOSIS — M545 Low back pain, unspecified: Secondary | ICD-10-CM | POA: Diagnosis not present

## 2023-11-30 DIAGNOSIS — M47896 Other spondylosis, lumbar region: Secondary | ICD-10-CM | POA: Diagnosis not present

## 2023-11-30 DIAGNOSIS — M791 Myalgia, unspecified site: Secondary | ICD-10-CM | POA: Diagnosis not present

## 2023-12-22 ENCOUNTER — Other Ambulatory Visit: Payer: Self-pay | Admitting: Family

## 2023-12-22 DIAGNOSIS — Z1231 Encounter for screening mammogram for malignant neoplasm of breast: Secondary | ICD-10-CM

## 2023-12-30 DIAGNOSIS — M47816 Spondylosis without myelopathy or radiculopathy, lumbar region: Secondary | ICD-10-CM | POA: Diagnosis not present

## 2024-01-06 ENCOUNTER — Ambulatory Visit
Admission: RE | Admit: 2024-01-06 | Discharge: 2024-01-06 | Disposition: A | Discharge status: Home / Self Care | Source: Ambulatory Visit | Attending: Family | Admitting: Family

## 2024-01-06 DIAGNOSIS — Z1231 Encounter for screening mammogram for malignant neoplasm of breast: Secondary | ICD-10-CM

## 2024-02-12 DIAGNOSIS — M47896 Other spondylosis, lumbar region: Secondary | ICD-10-CM | POA: Diagnosis not present

## 2024-02-12 DIAGNOSIS — M545 Low back pain, unspecified: Secondary | ICD-10-CM | POA: Diagnosis not present

## 2024-02-12 DIAGNOSIS — M791 Myalgia, unspecified site: Secondary | ICD-10-CM | POA: Diagnosis not present

## 2024-02-24 ENCOUNTER — Other Ambulatory Visit: Payer: Self-pay | Admitting: Family

## 2024-02-24 DIAGNOSIS — E1159 Type 2 diabetes mellitus with other circulatory complications: Secondary | ICD-10-CM

## 2024-03-16 ENCOUNTER — Ambulatory Visit: Payer: Self-pay

## 2024-03-16 NOTE — Telephone Encounter (Signed)
 FYI Only or Action Required?: FYI only for provider.  Patient was last seen in primary care on 09/29/2023 by Lavell Bari LABOR, FNP.  Called Nurse Triage reporting Neck Pain.  Symptoms began about a month ago.pain has continued and will increase at times.   Interventions attempted: OTC medications: Icy Hot and Tylenol  and Rest, hydration, or home remedies.  Symptoms are: unchanged.  Triage Disposition: See PCP When Office is Open (Within 3 Days)  Patient/caregiver understands and will follow disposition?: Yes  Copied from CRM [id number]. Topic: Clinical - Red Word Triage >> Mar 16, 2024 11:02 AM [place] B wrote: Pt has stiff neck on the left side and some pain  1. ONSET: When did the pain begin? Pain started last month-patient reports she woke up with a stiff neck and hasn't gotten better.   2. LOCATION: Where does it hurt?   Left side of neck   3. PATTERN Does the pain come and go, or has it been constant since it started?  Comes and goes  4. SEVERITY: How bad is the pain? (Scale 0-10; or none or slight stiffness, mild, moderate, severe) - NO PAIN (0): No pain, or only slight stiffness.  - MILD (1-3): Doesn't interfere with normal activities.  - MODERATE (4-7): Interferes with normal activities or awakens from sleep.  - SEVERE (8-10): Excruciating pain, unable to do any normal activities.  Patient reports a pain level of 3 out of 10 currently.   5. RADIATION: Does the pain go anywhere else, shoot into your arms?  No radiation  6. CORD SYMPTOMS: Any weakness or numbness of the arms or legs?   No  7. CAUSE: What do you think is causing the neck pain?  Unsure  8. NECK OVERUSE: Any recent activities that involved turning or twisting the neck?  Patient denies any neck overuse  9. OTHER SYMPTOMS: Do you have any other symptoms? (e.g., headache, fever, chest pain, difficulty breathing, neck swelling) no other symptoms  Patient reports using Icy Hot and  Tylenol -endorses both do help with pain but the pain does come back.  Reason for Disposition  [1] MODERATE neck pain (e.g., interferes with normal activities) AND [2] present > 3 days  Protocols used: Neck Pain or Stiffness-A-AH

## 2024-03-16 NOTE — Telephone Encounter (Signed)
 Apt scheduled.

## 2024-03-18 ENCOUNTER — Ambulatory Visit (INDEPENDENT_AMBULATORY_CARE_PROVIDER_SITE_OTHER): Admitting: Family Medicine

## 2024-03-18 ENCOUNTER — Encounter: Payer: Self-pay | Admitting: Family Medicine

## 2024-03-18 VITALS — BP 142/72 | HR 74 | Temp 97.2°F | Ht 67.0 in | Wt 205.8 lb

## 2024-03-18 DIAGNOSIS — M436 Torticollis: Secondary | ICD-10-CM

## 2024-03-18 MED ORDER — BACLOFEN 5 MG PO TABS
2.5000 mg | ORAL_TABLET | Freq: Three times a day (TID) | ORAL | 0 refills | Status: DC | PRN
Start: 2024-03-18 — End: 2024-04-11

## 2024-03-18 NOTE — Progress Notes (Addendum)
 Subjective:  Patient ID: Colleen Duran, female    DOB: 27-Apr-1946, 78 y.o.   MRN: [id number]  Patient Care Team: Lavell Bari LABOR, FNP as PCP - General (Family Medicine) Vicci Mcardle, [place] (Optometry)   Chief Complaint:  Neck Pain (Neck pain and stiffness started in June. Patient states she started taking pain medication for it, using heat, cool compresses and it is not helping. )   HPI: Colleen Duran is a 78 y.o. female presenting on 03/18/2024 for Neck Pain (Neck pain and stiffness started in June. Patient states she started taking pain medication for it, using heat, cool compresses and it is not helping. )   The patient presents with neck pain and stiffness.  Neck pain and stiffness have been present since June, initially improving with pain pills, hot pads, and other self-care measures. Symptoms recurred this month, primarily affecting the left side of the neck and causing difficulty turning to the left.  There is no history of neck injury. They have not tried changing their pillow, although they sleep mostly on their side, sometimes on their back. The pain is described as stiffness and is more pronounced when turning to the left.  Current medications include tramadol and meloxicam, taken as needed, and Tylenol , used frequently for pain management. These medications provide temporary relief, but the pain returns after some time. Heat application to the affected area provides some relief. They have not had a refill for meloxicam recently.  No recent injury to the neck.          Relevant past medical, surgical, family, and social history reviewed and updated as indicated.  Allergies and medications reviewed and updated. Data reviewed: Chart in Epic.   Past Medical History:  Diagnosis Date   Anemia    Cataract    Bi-lateral, [facility] [place]   Diabetes mellitus without complication (HCC)    TYPE 2   High cholesterol    Hypertension    RHA (rheumatoid  arthritis) (HCC)    RA   Wears glasses     Past Surgical History:  Procedure Laterality Date   ABDOMINAL HYSTERECTOMY     BUNIONECTOMY     bilateral feet   CATARACT EXTRACTION, BILATERAL     CESAREAN SECTION     COLONOSCOPY N/A 05/11/2015   Procedure: COLONOSCOPY;  Surgeon: Margo LITTIE Haddock, MD;  Location: [facility];  Service: Endoscopy;  Laterality: N/A;  2:15 PM   REVERSE SHOULDER ARTHROPLASTY Left 08/23/2020   Procedure: REVERSE SHOULDER ARTHROPLASTY;  Surgeon: Sharl Selinda Dover, MD;  Location: [facility];  Service: Orthopedics;  Laterality: Left;  2.5 hrs   TOTAL KNEE ARTHROPLASTY Left 07/24/2014   Procedure: LEFT TOTAL KNEE ARTHROPLASTY;  Surgeon: Marcey Raman, MD;  Location: [facility] OR;  Service: Orthopedics;  Laterality: Left;   TOTAL KNEE ARTHROPLASTY Right 10/08/2015   Procedure: RIGHT TOTAL KNEE ARTHROPLASTY;  Surgeon: Marcey Raman, MD;  Location: [facility] OR;  Service: Orthopedics;  Laterality: Right;    Social History   Socioeconomic History   Marital status: Widowed    Spouse name: Not on file   Number of children: 2   Years of education: 32   Highest education level: 12th grade  Occupational History   Occupation: Retired    Comment: Retail buyer  Tobacco Use   Smoking status: Never   Smokeless tobacco: Never  Vaping Use   Vaping status: Never Used  Substance and Sexual Activity   Alcohol  use: No  Drug use: No   Sexual activity: Not Currently    Birth control/protection: Surgical  Other Topics Concern   Not on file  Social History Narrative   Ms Goonan lives with her daughter and grandchildren.    Social Drivers of Corporate investment banker Strain: Low Risk  (11/09/2023)   Overall Financial Resource Strain (CARDIA)    Difficulty of Paying Living Expenses: Not hard at all  Food Insecurity: No Food Insecurity (11/09/2023)   Hunger Vital Sign    Worried About Running Out of Food in the Last Year: Never true    Ran Out of Food in the Last Year: Never  true  Transportation Needs: No Transportation Needs (11/09/2023)   PRAPARE - Administrator, Civil Service (Medical): No    Lack of Transportation (Non-Medical): No  Physical Activity: Insufficiently Active (11/09/2023)   Exercise Vital Sign    Days of Exercise per Week: 3 days    Minutes of Exercise per Session: 30 min  Stress: No Stress Concern Present (11/09/2023)   [name]    Feeling of Stress : Not at all  Social Connections: Moderately Isolated (11/09/2023)   Social Connection and Isolation Panel    Frequency of Communication with Friends and Family: More than three times a week    Frequency of Social Gatherings with Friends and Family: Three times a week    Attends Religious Services: More than 4 times per year    Active Member of Clubs or Organizations: No    Attends Banker Meetings: Never    Marital Status: Widowed  Intimate Partner Violence: Not At Risk (11/09/2023)   Humiliation, Afraid, Rape, and Kick questionnaire    Fear of Current or Ex-Partner: No    Emotionally Abused: No    Physically Abused: No    Sexually Abused: No    Outpatient Encounter Medications as of 03/18/2024  Medication Sig   acetaminophen  (TYLENOL ) 500 MG tablet Take 1,000 mg by mouth every 6 (six) hours as needed for mild pain.   amLODipine  (NORVASC ) 10 MG tablet TAKE 1 TABLET BY MOUTH EVERY DAY   aspirin  EC 81 MG tablet Take 1 tablet (81 mg total) by mouth daily.   atorvastatin  (LIPITOR) 20 MG tablet Take 1 tablet (20 mg total) by mouth daily.   Baclofen  5 MG TABS Take 0.5 tablets (2.5 mg total) by mouth 3 (three) times daily as needed (muscle stiffness).   busPIRone (BUSPAR) 10 MG tablet Take 10 mg by mouth 2 (two) times daily.   ezetimibe  (ZETIA ) 10 MG tablet TAKE 1 TABLET BY MOUTH EVERY DAY   ferrous sulfate 324 MG TBEC Take 324 mg by mouth daily.   meloxicam (MOBIC) 7.5 MG tablet Take 7.5 mg by mouth daily as  needed.   metFORMIN  (GLUCOPHAGE ) 500 MG tablet TAKE 1 TABLET BY MOUTH 2 TIMES DAILY WITH A MEAL.   metoprolol  succinate (TOPROL -XL) 25 MG 24 hr tablet TAKE 1 TABLET (25 MG TOTAL) BY MOUTH DAILY.   Multiple Vitamin (MULTIVITAMIN WITH MINERALS) TABS tablet Take 1 tablet by mouth daily.   olmesartan -hydrochlorothiazide  (BENICAR  HCT) 40-25 MG tablet TAKE 1 TABLET BY MOUTH EVERY DAY   spironolactone  (ALDACTONE ) 25 MG tablet TAKE 1/2 TABLET BY MOUTH DAILY   traMADol (ULTRAM) 50 MG tablet Take 50 mg by mouth 2 (two) times daily as needed.   No facility-administered encounter medications on file as of 03/18/2024.    No Known Allergies  Pertinent ROS per HPI, otherwise unremarkable      Objective:  BP (!) 142/72   Pulse 74   Temp (!) 97.2 F (36.2 C)   Ht 5' 7 (1.702 m)   Wt 205 lb 12.8 oz (93.4 kg)   SpO2 98%   BMI 32.23 kg/m    Wt Readings from Last 3 Encounters:  03/18/24 205 lb 12.8 oz (93.4 kg)  11/09/23 213 lb (96.6 kg)  09/29/23 213 lb (96.6 kg)    Physical Exam Vitals and nursing note reviewed.  Constitutional:      General: She is not in acute distress.    Appearance: Normal appearance. She is obese. She is not ill-appearing, toxic-appearing or diaphoretic.  HENT:     Head: Normocephalic and atraumatic.     Nose: Nose normal.     Mouth/Throat:     Mouth: Mucous membranes are moist.  Eyes:     Pupils: Pupils are equal, round, and reactive to light.  Neck:     Trachea: Trachea and phonation normal.   Cardiovascular:     Rate and Rhythm: Normal rate and regular rhythm.     Heart sounds: Normal heart sounds.  Pulmonary:     Effort: Pulmonary effort is normal.     Breath sounds: Normal breath sounds.  Musculoskeletal:     Right shoulder: Normal.     Left shoulder: Normal.     Cervical back: Spasms, torticollis and tenderness present. No swelling, edema, deformity, erythema, signs of trauma, lacerations, rigidity, bony tenderness or crepitus. Muscular tenderness  present. No pain with movement or spinous process tenderness. Normal range of motion.     Right lower leg: Edema present.     Left lower leg: Edema present.  Skin:    General: Skin is warm and dry.     Capillary Refill: Capillary refill takes less than 2 seconds.  Neurological:     General: No focal deficit present.     Mental Status: She is alert and oriented to person, place, and time.     Gait: Gait abnormal (using cane).  Psychiatric:        Mood and Affect: Mood normal.        Behavior: Behavior normal.        Thought Content: Thought content normal.        Judgment: Judgment normal.    Physical Exam   NECK: Neck muscles tight bilaterally, more on left side.        Results for orders placed or performed in visit on 09/29/23  Microalbumin / creatinine urine ratio   Collection Time: 09/29/23 10:57 AM  Result Value Ref Range   Creatinine, Urine 60.5 Not Estab. mg/dL   Microalbumin, Urine 895.5 Not Estab. ug/mL   Microalb/Creat Ratio 173 (H) 0 - 29 mg/g creat  Bayer DCA Hb A1c Waived   Collection Time: 09/29/23 10:57 AM  Result Value Ref Range   HB A1C (BAYER DCA - WAIVED) 6.3 (H) 4.8 - 5.6 %  CMP14+EGFR   Collection Time: 09/29/23 11:48 AM  Result Value Ref Range   Glucose 96 70 - 99 mg/dL   BUN 17 8 - 27 mg/dL   Creatinine, Ser 8.85 (H) 0.57 - 1.00 mg/dL   eGFR 49 (L) >40 fO/fpw/8.26   BUN/Creatinine Ratio 15 12 - 28   Sodium 135 134 - 144 mmol/L   Potassium 4.4 3.5 - 5.2 mmol/L   Chloride 95 (L) 96 - 106 mmol/L   CO2 23 20 -  29 mmol/L   Calcium  10.0 8.7 - 10.3 mg/dL   Total Protein 7.4 6.0 - 8.5 g/dL   Albumin 4.5 3.8 - 4.8 g/dL   Globulin, Total 2.9 1.5 - 4.5 g/dL   Bilirubin Total 0.3 0.0 - 1.2 mg/dL   Alkaline Phosphatase 81 44 - 121 IU/L   AST 17 0 - 40 IU/L   ALT 17 0 - 32 IU/L  CBC with Differential/Platelet   Collection Time: 09/29/23 11:48 AM  Result Value Ref Range   WBC 4.5 3.4 - 10.8 x10E3/uL   RBC 4.37 3.77 - 5.28 x10E6/uL   Hemoglobin 12.9  11.1 - 15.9 g/dL   Hematocrit 60.9 65.9 - 46.6 %   MCV 89 79 - 97 fL   MCH 29.5 26.6 - 33.0 pg   MCHC 33.1 31.5 - 35.7 g/dL   RDW 86.3 88.2 - 84.5 %   Platelets 279 150 - 450 x10E3/uL   Neutrophils 38 Not Estab. %   Lymphs 49 Not Estab. %   Monocytes 8 Not Estab. %   Eos 5 Not Estab. %   Basos 0 Not Estab. %   Neutrophils Absolute 1.7 1.4 - 7.0 x10E3/uL   Lymphocytes Absolute 2.2 0.7 - 3.1 x10E3/uL   Monocytes Absolute 0.4 0.1 - 0.9 x10E3/uL   EOS (ABSOLUTE) 0.2 0.0 - 0.4 x10E3/uL   Basophils Absolute 0.0 0.0 - 0.2 x10E3/uL   Immature Granulocytes 0 Not Estab. %   Immature Grans (Abs) 0.0 0.0 - 0.1 x10E3/uL  Lipid panel   Collection Time: 09/29/23 11:48 AM  Result Value Ref Range   Cholesterol, Total 225 (H) 100 - 199 mg/dL   Triglycerides 886 0 - 149 mg/dL   HDL 68 >60 mg/dL   VLDL Cholesterol Cal 20 5 - 40 mg/dL   LDL Chol Calc (NIH) 862 (H) 0 - 99 mg/dL   Chol/HDL Ratio 3.3 0.0 - 4.4 ratio       Pertinent labs & imaging results that were available during my care of the patient were reviewed by me and considered in my medical decision making.  Assessment & Plan:  Jonda was seen today for neck pain.  Diagnoses and all orders for this visit:  Neck stiffness -     Baclofen  5 MG TABS; Take 0.5 tablets (2.5 mg total) by mouth 3 (three) times daily as needed (muscle stiffness).     Neck Pain with Muscle Spasm Chronic neck pain since June, exacerbated in July, with stiffness and difficulty turning to the left. No injury history. Pain is more pronounced on the left side. Current management with tramadol, meloxicam, and acetaminophen  provides temporary relief. Physical examination reveals tight neck muscles, suggesting muscle spasm as a contributing factor. - Prescribe baclofen , instruct to take 2.5 mg (half tablet) up to three times a day as needed for muscle spasms, with a total of 10 tablets provided. Discuss potential side effects such as drowsiness and dizziness. - Advise  use of heat therapy to the affected area. - Recommend obtaining a new pillow to potentially alleviate neck stiffness. - Suggest considering a massage to help relax neck muscles.          Continue all other maintenance medications.  Follow up plan: Return if symptoms worsen or fail to improve.   Continue healthy lifestyle choices, including diet (rich in fruits, vegetables, and lean proteins, and low in salt and simple carbohydrates) and exercise (at least 30 minutes of moderate physical activity daily).  Educational handout given for cervical strain  The above assessment and management plan was discussed with the patient. The patient verbalized understanding of and has agreed to the management plan. Patient is aware to call the clinic if they develop any new symptoms or if symptoms persist or worsen. Patient is aware when to return to the clinic for a follow-up visit. Patient educated on when it is appropriate to go to the emergency department.   Rosaline Bruns, FNP-C [facility] [phone]

## 2024-03-27 ENCOUNTER — Other Ambulatory Visit: Payer: Self-pay | Admitting: Family

## 2024-03-27 DIAGNOSIS — I152 Hypertension secondary to endocrine disorders: Secondary | ICD-10-CM

## 2024-03-28 ENCOUNTER — Encounter: Payer: Self-pay | Admitting: Family

## 2024-03-28 ENCOUNTER — Ambulatory Visit: Payer: Medicare HMO | Admitting: Family

## 2024-03-28 VITALS — BP 148/74 | HR 73 | Temp 97.5°F | Ht 67.0 in | Wt 208.0 lb

## 2024-03-28 DIAGNOSIS — M150 Primary generalized (osteo)arthritis: Secondary | ICD-10-CM | POA: Diagnosis not present

## 2024-03-28 DIAGNOSIS — E669 Obesity, unspecified: Secondary | ICD-10-CM | POA: Diagnosis not present

## 2024-03-28 DIAGNOSIS — E785 Hyperlipidemia, unspecified: Secondary | ICD-10-CM

## 2024-03-28 DIAGNOSIS — E1169 Type 2 diabetes mellitus with other specified complication: Secondary | ICD-10-CM

## 2024-03-28 DIAGNOSIS — E119 Type 2 diabetes mellitus without complications: Secondary | ICD-10-CM | POA: Diagnosis not present

## 2024-03-28 DIAGNOSIS — I152 Hypertension secondary to endocrine disorders: Secondary | ICD-10-CM

## 2024-03-28 DIAGNOSIS — M8580 Other specified disorders of bone density and structure, unspecified site: Secondary | ICD-10-CM | POA: Diagnosis not present

## 2024-03-28 DIAGNOSIS — E1159 Type 2 diabetes mellitus with other circulatory complications: Secondary | ICD-10-CM

## 2024-03-28 DIAGNOSIS — Z7984 Long term (current) use of oral hypoglycemic drugs: Secondary | ICD-10-CM

## 2024-03-28 LAB — CMP14+EGFR
ALT: 10 IU/L (ref 0–32)
AST: 16 IU/L (ref 0–40)
Albumin: 4.8 g/dL (ref 3.8–4.8)
Alkaline Phosphatase: 86 IU/L (ref 44–121)
BUN/Creatinine Ratio: 16 (ref 12–28)
BUN: 18 mg/dL (ref 8–27)
Bilirubin Total: 0.4 mg/dL (ref 0.0–1.2)
CO2: 20 mmol/L (ref 20–29)
Calcium: 10.5 mg/dL — ABNORMAL HIGH (ref 8.7–10.3)
Chloride: 98 mmol/L (ref 96–106)
Creatinine, Ser: 1.12 mg/dL — ABNORMAL HIGH (ref 0.57–1.00)
Globulin, Total: 3.1 g/dL (ref 1.5–4.5)
Glucose: 120 mg/dL — ABNORMAL HIGH (ref 70–99)
Potassium: 4.2 mmol/L (ref 3.5–5.2)
Sodium: 137 mmol/L (ref 134–144)
Total Protein: 7.9 g/dL (ref 6.0–8.5)
eGFR: 50 mL/min/1.73 — ABNORMAL LOW (ref >59)

## 2024-03-28 LAB — BAYER DCA HB A1C WAIVED: HB A1C (BAYER DCA - WAIVED): 6.3 % — ABNORMAL HIGH (ref 4.8–5.6)

## 2024-03-28 MED ORDER — METOPROLOL SUCCINATE ER 25 MG PO TB24: 25.0000 mg | ORAL_TABLET | Freq: Every day | ORAL | 1 refills | Status: DC

## 2024-03-28 MED ORDER — AMLODIPINE BESYLATE 10 MG PO TABS: 10.0000 mg | ORAL_TABLET | Freq: Every day | ORAL | 1 refills | Status: DC

## 2024-03-28 MED ORDER — SPIRONOLACTONE 25 MG PO TABS: 25.0000 mg | ORAL_TABLET | Freq: Every day | ORAL | 1 refills | Status: DC

## 2024-03-28 MED ORDER — EZETIMIBE 10 MG PO TABS
10.0000 mg | ORAL_TABLET | Freq: Every day | ORAL | 1 refills | Status: AC
Start: 2024-03-28 — End: ?

## 2024-03-28 MED ORDER — OLMESARTAN MEDOXOMIL-HCTZ 40-25 MG PO TABS
1.0000 | ORAL_TABLET | Freq: Every day | ORAL | 1 refills | Status: AC
Start: 2024-03-28 — End: ?

## 2024-03-28 MED ORDER — METFORMIN HCL 500 MG PO TABS: 500.0000 mg | ORAL_TABLET | Freq: Two times a day (BID) | ORAL | 1 refills | Status: DC

## 2024-03-28 NOTE — Progress Notes (Signed)
 Subjective:    Patient ID: Colleen Duran, female    DOB: 1946-01-20, 78 y.o.   MRN: [id number]  Chief Complaint  Patient presents with   Medical Management of Chronic Issues   PT presents to the office today for chronic follow up.    She has osteopenia and taking calcium  and vit d daily. Last dexa scan 09/29/23.  Diabetes She presents for her follow-up diabetic visit. She has type 2 diabetes mellitus. Pertinent negatives for diabetes include no blurred vision and no foot paresthesias. Symptoms are stable. Diabetic complications include peripheral neuropathy. Risk factors for coronary artery disease include dyslipidemia, diabetes mellitus, hypertension and sedentary lifestyle. She is following a generally healthy diet. Her overall blood glucose range is 130-140 mg/dl. Eye exam is current.  Hypertension This is a chronic problem. The current episode started more than 1 year ago. The problem has been resolved since onset. The problem is controlled. Associated symptoms include malaise/fatigue and peripheral edema. Pertinent negatives include no blurred vision or shortness of breath. Risk factors for coronary artery disease include diabetes mellitus, dyslipidemia, obesity and sedentary lifestyle. The current treatment provides moderate improvement.  Hyperlipidemia This is a chronic problem. The current episode started more than 1 year ago. The problem is uncontrolled. Recent lipid tests were reviewed and are high. Exacerbating diseases include obesity. Pertinent negatives include no shortness of breath. Current antihyperlipidemic treatment includes ezetimibe . The current treatment provides mild improvement of lipids. Risk factors for coronary artery disease include dyslipidemia, diabetes mellitus, hypertension, a sedentary lifestyle and post-menopausal.  Arthritis Presents for follow-up visit. She complains of pain and stiffness. The symptoms have been stable. Affected locations include the left knee,  right knee, right MCP and left MCP. Her pain is at a severity of 3/10.      Review of Systems  Constitutional:  Positive for malaise/fatigue.  Eyes:  Negative for blurred vision.  Respiratory:  Negative for shortness of breath.   Musculoskeletal:  Positive for stiffness.  All other systems reviewed and are negative.      Family History  Problem Relation Age of Onset   Hypertension Mother    Stroke Mother    Cancer Sister        lung   Healthy Daughter    Healthy Daughter    Cancer Brother    Mental retardation Brother    Breast cancer Neg Hx    Social History   Socioeconomic History   Marital status: Widowed    Spouse name: Not on file   Number of children: 2   Years of education: 73   Highest education level: 12th grade  Occupational History   Occupation: Retired    Comment: Retail buyer  Tobacco Use   Smoking status: Never   Smokeless tobacco: Never  Vaping Use   Vaping status: Never Used  Substance and Sexual Activity   Alcohol  use: No   Drug use: No   Sexual activity: Not Currently    Birth control/protection: Surgical  Other Topics Concern   Not on file  Social History Narrative   Ms Labella lives with her daughter and grandchildren.    Social Drivers of Corporate investment banker Strain: Low Risk  (11/09/2023)   Overall Financial Resource Strain (CARDIA)    Difficulty of Paying Living Expenses: Not hard at all  Food Insecurity: No Food Insecurity (11/09/2023)   Hunger Vital Sign    Worried About Running Out of Food in the Last Year: Never true  Ran Out of Food in the Last Year: Never true  Transportation Needs: No Transportation Needs (11/09/2023)   PRAPARE - Administrator, Civil Service (Medical): No    Lack of Transportation (Non-Medical): No  Physical Activity: Insufficiently Active (11/09/2023)   Exercise Vital Sign    Days of Exercise per Week: 3 days    Minutes of Exercise per Session: 30 min  Stress: No Stress  Concern Present (11/09/2023)   [name]    Feeling of Stress : Not at all  Social Connections: Moderately Isolated (11/09/2023)   Social Connection and Isolation Panel    Frequency of Communication with Friends and Family: More than three times a week    Frequency of Social Gatherings with Friends and Family: Three times a week    Attends Religious Services: More than 4 times per year    Active Member of Clubs or Organizations: No    Attends Banker Meetings: Never    Marital Status: Widowed    Objective:   Physical Exam Vitals reviewed.  Constitutional:      General: She is not in acute distress.    Appearance: She is well-developed. She is obese.  HENT:     Head: Normocephalic and atraumatic.     Right Ear: Tympanic membrane normal.     Left Ear: Tympanic membrane normal.     Ears:     Comments: HOH, hearing aid in place  Eyes:     Pupils: Pupils are equal, round, and reactive to light.  Neck:     Thyroid : No thyromegaly.  Cardiovascular:     Rate and Rhythm: Normal rate and regular rhythm.     Heart sounds: Normal heart sounds. No murmur heard. Pulmonary:     Effort: Pulmonary effort is normal. No respiratory distress.     Breath sounds: Normal breath sounds. No wheezing.  Abdominal:     General: Bowel sounds are normal. There is no distension.     Palpations: Abdomen is soft.     Tenderness: There is no abdominal tenderness.  Musculoskeletal:        General: No tenderness. Normal range of motion.     Cervical back: Normal range of motion and neck supple.  Skin:    General: Skin is warm and dry.  Neurological:     Mental Status: She is alert and oriented to person, place, and time.     Cranial Nerves: No cranial nerve deficit.     Deep Tendon Reflexes: Reflexes are normal and symmetric.  Psychiatric:        Behavior: Behavior normal.        Thought Content: Thought content normal.         Judgment: Judgment normal.       BP (!) 151/73   Pulse 73   Temp (!) 97.5 F (36.4 C) (Temporal)   Ht 5' 7 (1.702 m)   Wt 208 lb (94.3 kg)   SpO2 99%   BMI 32.58 kg/m      Assessment & Plan:   Colleen Duran comes in today with chief complaint of Medical Management of Chronic Issues   Diagnosis and orders addressed:  1. Hypertension associated with diabetes (HCC) - amLODipine  (NORVASC ) 10 MG tablet; Take 1 tablet (10 mg total) by mouth daily.  Dispense: 90 tablet; Refill: 1 - metoprolol  succinate (TOPROL -XL) 25 MG 24 hr tablet; Take 1 tablet (25 mg total) by mouth daily.  Dispense: 90 tablet; Refill: 1 - olmesartan -hydrochlorothiazide  (BENICAR  HCT) 40-25 MG tablet; Take 1 tablet by mouth daily.  Dispense: 90 tablet; Refill: 1 - spironolactone  (ALDACTONE ) 25 MG tablet; Take 1 tablet (25 mg total) by mouth daily.  Dispense: 90 tablet; Refill: 1 - CMP14+EGFR  2. Hyperlipidemia associated with type 2 diabetes mellitus (HCC) - ezetimibe  (ZETIA ) 10 MG tablet; Take 1 tablet (10 mg total) by mouth daily.  Dispense: 90 tablet; Refill: 1 - CMP14+EGFR  3. Type 2 diabetes mellitus without complication, without long-term current use of insulin  (HCC) (Primary)  - metFORMIN  (GLUCOPHAGE ) 500 MG tablet; Take 1 tablet (500 mg total) by mouth 2 (two) times daily with a meal.  Dispense: 180 tablet; Refill: 1 - CMP14+EGFR - Bayer DCA Hb A1c Waived  4. Obesity (BMI 30-39.9) - CMP14+EGFR  5. Primary osteoarthritis involving multiple joints - CMP14+EGFR  6. Osteopenia, unspecified location - CMP14+EGFR  Labs pending Will increase spironolactone  to 25 mg from 12.5 mg -Daily blood pressure log given with instructions on how to fill out and told to bring to next visit -Dash diet information given -Exercise encouraged - Stress Management  -Continue current meds Continue medications  Health Maintenance reviewed Diet and exercise encouraged  Follow up plan: 3 weeks  Bari Learn,  FNP

## 2024-03-28 NOTE — Patient Instructions (Signed)
 Hypertension, Adult High blood pressure (hypertension) is when the force of blood pumping through the arteries is too strong. The arteries are the blood vessels that carry blood from the heart throughout the body. Hypertension forces the heart to work harder to pump blood and may cause arteries to become narrow or stiff. Untreated or uncontrolled hypertension can lead to a heart attack, heart failure, a stroke, kidney disease, and other problems. A blood pressure reading consists of a higher number over a lower number. Ideally, your blood pressure should be below 120/80. The first ("top") number is called the systolic pressure. It is a measure of the pressure in your arteries as your heart beats. The second ("bottom") number is called the diastolic pressure. It is a measure of the pressure in your arteries as the heart relaxes. What are the causes? The exact cause of this condition is not known. There are some conditions that result in high blood pressure. What increases the risk? Certain factors may make you more likely to develop high blood pressure. Some of these risk factors are under your control, including: Smoking. Not getting enough exercise or physical activity. Being overweight. Having too much fat, sugar, calories, or salt (sodium) in your diet. Drinking too much alcohol. Other risk factors include: Having a personal history of heart disease, diabetes, high cholesterol, or kidney disease. Stress. Having a family history of high blood pressure and high cholesterol. Having obstructive sleep apnea. Age. The risk increases with age. What are the signs or symptoms? High blood pressure may not cause symptoms. Very high blood pressure (hypertensive crisis) may cause: Headache. Fast or irregular heartbeats (palpitations). Shortness of breath. Nosebleed. Nausea and vomiting. Vision changes. Severe chest pain, dizziness, and seizures. How is this diagnosed? This condition is diagnosed by  measuring your blood pressure while you are seated, with your arm resting on a flat surface, your legs uncrossed, and your feet flat on the floor. The cuff of the blood pressure monitor will be placed directly against the skin of your upper arm at the level of your heart. Blood pressure should be measured at least twice using the same arm. Certain conditions can cause a difference in blood pressure between your right and left arms. If you have a high blood pressure reading during one visit or you have normal blood pressure with other risk factors, you may be asked to: Return on a different day to have your blood pressure checked again. Monitor your blood pressure at home for 1 week or longer. If you are diagnosed with hypertension, you may have other blood or imaging tests to help your health care provider understand your overall risk for other conditions. How is this treated? This condition is treated by making healthy lifestyle changes, such as eating healthy foods, exercising more, and reducing your alcohol intake. You may be referred for counseling on a healthy diet and physical activity. Your health care provider may prescribe medicine if lifestyle changes are not enough to get your blood pressure under control and if: Your systolic blood pressure is above 130. Your diastolic blood pressure is above 80. Your personal target blood pressure may vary depending on your medical conditions, your age, and other factors. Follow these instructions at home: Eating and drinking  Eat a diet that is high in fiber and potassium, and low in sodium, added sugar, and fat. An example of this eating plan is called the DASH diet. DASH stands for Dietary Approaches to Stop Hypertension. To eat this way: Eat  plenty of fresh fruits and vegetables. Try to fill one half of your plate at each meal with fruits and vegetables. Eat whole grains, such as whole-wheat pasta, brown rice, or whole-grain bread. Fill about one  fourth of your plate with whole grains. Eat or drink low-fat dairy products, such as skim milk or low-fat yogurt. Avoid fatty cuts of meat, processed or cured meats, and poultry with skin. Fill about one fourth of your plate with lean proteins, such as fish, chicken without skin, beans, eggs, or tofu. Avoid pre-made and processed foods. These tend to be higher in sodium, added sugar, and fat. Reduce your daily sodium intake. Many people with hypertension should eat less than 1,500 mg of sodium a day. Do not drink alcohol if: Your health care provider tells you not to drink. You are pregnant, may be pregnant, or are planning to become pregnant. If you drink alcohol: Limit how much you have to: 0-1 drink a day for women. 0-2 drinks a day for men. Know how much alcohol is in your drink. In the [place] one drink equals one 12 oz bottle of beer (355 mL), one 5 oz glass of wine (148 mL), or one 1 oz glass of hard liquor (44 mL). Lifestyle  Work with your health care provider to maintain a healthy body weight or to lose weight. Ask what an ideal weight is for you. Get at least 30 minutes of exercise that causes your heart to beat faster (aerobic exercise) most days of the week. Activities may include walking, swimming, or biking. Include exercise to strengthen your muscles (resistance exercise), such as Pilates or lifting weights, as part of your weekly exercise routine. Try to do these types of exercises for 30 minutes at least 3 days a week. Do not use any products that contain nicotine or tobacco. These products include cigarettes, chewing tobacco, and vaping devices, such as e-cigarettes. If you need help quitting, ask your health care provider. Monitor your blood pressure at home as told by your health care provider. Keep all follow-up visits. This is important. Medicines Take over-the-counter and prescription medicines only as told by your health care provider. Follow directions carefully. Blood  pressure medicines must be taken as prescribed. Do not skip doses of blood pressure medicine. Doing this puts you at risk for problems and can make the medicine less effective. Ask your health care provider about side effects or reactions to medicines that you should watch for. Contact a health care provider if you: Think you are having a reaction to a medicine you are taking. Have headaches that keep coming back (recurring). Feel dizzy. Have swelling in your ankles. Have trouble with your vision. Get help right away if you: Develop a severe headache or confusion. Have unusual weakness or numbness. Feel faint. Have severe pain in your chest or abdomen. Vomit repeatedly. Have trouble breathing. These symptoms may be an emergency. Get help right away. Call 911. Do not wait to see if the symptoms will go away. Do not drive yourself to the hospital. Summary Hypertension is when the force of blood pumping through your arteries is too strong. If this condition is not controlled, it may put you at risk for serious complications. Your personal target blood pressure may vary depending on your medical conditions, your age, and other factors. For most people, a normal blood pressure is less than 120/80. Hypertension is treated with lifestyle changes, medicines, or a combination of both. Lifestyle changes include losing weight, eating a healthy,  low-sodium diet, exercising more, and limiting alcohol. This information is not intended to replace advice given to you by your health care provider. Make sure you discuss any questions you have with your health care provider. Document Revised: 07/02/2021 Document Reviewed: 07/02/2021 Elsevier Patient Education  2024 [employer].

## 2024-03-29 ENCOUNTER — Ambulatory Visit: Payer: Self-pay | Admitting: Family

## 2024-03-31 ENCOUNTER — Other Ambulatory Visit: Payer: Self-pay | Admitting: Family Medicine

## 2024-03-31 DIAGNOSIS — M436 Torticollis: Secondary | ICD-10-CM

## 2024-04-11 ENCOUNTER — Encounter: Payer: Self-pay | Admitting: Family

## 2024-04-11 ENCOUNTER — Ambulatory Visit: Admitting: Family

## 2024-04-11 VITALS — BP 128/71 | HR 66 | Temp 98.0°F | Ht 67.0 in | Wt 204.0 lb

## 2024-04-11 DIAGNOSIS — M436 Torticollis: Secondary | ICD-10-CM

## 2024-04-11 DIAGNOSIS — G8929 Other chronic pain: Secondary | ICD-10-CM

## 2024-04-11 DIAGNOSIS — M545 Low back pain, unspecified: Secondary | ICD-10-CM

## 2024-04-11 DIAGNOSIS — E1159 Type 2 diabetes mellitus with other circulatory complications: Secondary | ICD-10-CM | POA: Diagnosis not present

## 2024-04-11 DIAGNOSIS — I152 Hypertension secondary to endocrine disorders: Secondary | ICD-10-CM | POA: Diagnosis not present

## 2024-04-11 LAB — BMP8+EGFR
BUN/Creatinine Ratio: 18 (ref 12–28)
BUN: 22 mg/dL (ref 8–27)
CO2: 19 mmol/L — ABNORMAL LOW (ref 20–29)
Calcium: 10.1 mg/dL (ref 8.7–10.3)
Chloride: 99 mmol/L (ref 96–106)
Creatinine, Ser: 1.25 mg/dL — ABNORMAL HIGH (ref 0.57–1.00)
Glucose: 122 mg/dL — ABNORMAL HIGH (ref 70–99)
Potassium: 4.4 mmol/L (ref 3.5–5.2)
Sodium: 137 mmol/L (ref 134–144)
eGFR: 44 mL/min/1.73 — ABNORMAL LOW (ref >59)

## 2024-04-11 MED ORDER — BACLOFEN 5 MG PO TABS
2.5000 mg | ORAL_TABLET | Freq: Three times a day (TID) | ORAL | 2 refills | Status: DC | PRN
Start: 2024-04-11 — End: 2024-06-27

## 2024-04-11 MED ORDER — TRAMADOL HCL 50 MG PO TABS: 50.0000 mg | ORAL_TABLET | Freq: Two times a day (BID) | ORAL | 1 refills | Status: DC | PRN

## 2024-04-11 NOTE — Patient Instructions (Signed)
 Hypertension, Adult High blood pressure (hypertension) is when the force of blood pumping through the arteries is too strong. The arteries are the blood vessels that carry blood from the heart throughout the body. Hypertension forces the heart to work harder to pump blood and may cause arteries to become narrow or stiff. Untreated or uncontrolled hypertension can lead to a heart attack, heart failure, a stroke, kidney disease, and other problems. A blood pressure reading consists of a higher number over a lower number. Ideally, your blood pressure should be below 120/80. The first ("top") number is called the systolic pressure. It is a measure of the pressure in your arteries as your heart beats. The second ("bottom") number is called the diastolic pressure. It is a measure of the pressure in your arteries as the heart relaxes. What are the causes? The exact cause of this condition is not known. There are some conditions that result in high blood pressure. What increases the risk? Certain factors may make you more likely to develop high blood pressure. Some of these risk factors are under your control, including: Smoking. Not getting enough exercise or physical activity. Being overweight. Having too much fat, sugar, calories, or salt (sodium) in your diet. Drinking too much alcohol. Other risk factors include: Having a personal history of heart disease, diabetes, high cholesterol, or kidney disease. Stress. Having a family history of high blood pressure and high cholesterol. Having obstructive sleep apnea. Age. The risk increases with age. What are the signs or symptoms? High blood pressure may not cause symptoms. Very high blood pressure (hypertensive crisis) may cause: Headache. Fast or irregular heartbeats (palpitations). Shortness of breath. Nosebleed. Nausea and vomiting. Vision changes. Severe chest pain, dizziness, and seizures. How is this diagnosed? This condition is diagnosed by  measuring your blood pressure while you are seated, with your arm resting on a flat surface, your legs uncrossed, and your feet flat on the floor. The cuff of the blood pressure monitor will be placed directly against the skin of your upper arm at the level of your heart. Blood pressure should be measured at least twice using the same arm. Certain conditions can cause a difference in blood pressure between your right and left arms. If you have a high blood pressure reading during one visit or you have normal blood pressure with other risk factors, you may be asked to: Return on a different day to have your blood pressure checked again. Monitor your blood pressure at home for 1 week or longer. If you are diagnosed with hypertension, you may have other blood or imaging tests to help your health care provider understand your overall risk for other conditions. How is this treated? This condition is treated by making healthy lifestyle changes, such as eating healthy foods, exercising more, and reducing your alcohol intake. You may be referred for counseling on a healthy diet and physical activity. Your health care provider may prescribe medicine if lifestyle changes are not enough to get your blood pressure under control and if: Your systolic blood pressure is above 130. Your diastolic blood pressure is above 80. Your personal target blood pressure may vary depending on your medical conditions, your age, and other factors. Follow these instructions at home: Eating and drinking  Eat a diet that is high in fiber and potassium, and low in sodium, added sugar, and fat. An example of this eating plan is called the DASH diet. DASH stands for Dietary Approaches to Stop Hypertension. To eat this way: Eat  plenty of fresh fruits and vegetables. Try to fill one half of your plate at each meal with fruits and vegetables. Eat whole grains, such as whole-wheat pasta, brown rice, or whole-grain bread. Fill about one  fourth of your plate with whole grains. Eat or drink low-fat dairy products, such as skim milk or low-fat yogurt. Avoid fatty cuts of meat, processed or cured meats, and poultry with skin. Fill about one fourth of your plate with lean proteins, such as fish, chicken without skin, beans, eggs, or tofu. Avoid pre-made and processed foods. These tend to be higher in sodium, added sugar, and fat. Reduce your daily sodium intake. Many people with hypertension should eat less than 1,500 mg of sodium a day. Do not drink alcohol if: Your health care provider tells you not to drink. You are pregnant, may be pregnant, or are planning to become pregnant. If you drink alcohol: Limit how much you have to: 0-1 drink a day for women. 0-2 drinks a day for men. Know how much alcohol is in your drink. In the [place] one drink equals one 12 oz bottle of beer (355 mL), one 5 oz glass of wine (148 mL), or one 1 oz glass of hard liquor (44 mL). Lifestyle  Work with your health care provider to maintain a healthy body weight or to lose weight. Ask what an ideal weight is for you. Get at least 30 minutes of exercise that causes your heart to beat faster (aerobic exercise) most days of the week. Activities may include walking, swimming, or biking. Include exercise to strengthen your muscles (resistance exercise), such as Pilates or lifting weights, as part of your weekly exercise routine. Try to do these types of exercises for 30 minutes at least 3 days a week. Do not use any products that contain nicotine or tobacco. These products include cigarettes, chewing tobacco, and vaping devices, such as e-cigarettes. If you need help quitting, ask your health care provider. Monitor your blood pressure at home as told by your health care provider. Keep all follow-up visits. This is important. Medicines Take over-the-counter and prescription medicines only as told by your health care provider. Follow directions carefully. Blood  pressure medicines must be taken as prescribed. Do not skip doses of blood pressure medicine. Doing this puts you at risk for problems and can make the medicine less effective. Ask your health care provider about side effects or reactions to medicines that you should watch for. Contact a health care provider if you: Think you are having a reaction to a medicine you are taking. Have headaches that keep coming back (recurring). Feel dizzy. Have swelling in your ankles. Have trouble with your vision. Get help right away if you: Develop a severe headache or confusion. Have unusual weakness or numbness. Feel faint. Have severe pain in your chest or abdomen. Vomit repeatedly. Have trouble breathing. These symptoms may be an emergency. Get help right away. Call 911. Do not wait to see if the symptoms will go away. Do not drive yourself to the hospital. Summary Hypertension is when the force of blood pumping through your arteries is too strong. If this condition is not controlled, it may put you at risk for serious complications. Your personal target blood pressure may vary depending on your medical conditions, your age, and other factors. For most people, a normal blood pressure is less than 120/80. Hypertension is treated with lifestyle changes, medicines, or a combination of both. Lifestyle changes include losing weight, eating a healthy,  low-sodium diet, exercising more, and limiting alcohol. This information is not intended to replace advice given to you by your health care provider. Make sure you discuss any questions you have with your health care provider. Document Revised: 07/02/2021 Document Reviewed: 07/02/2021 Elsevier Patient Education  2024 [employer].

## 2024-04-11 NOTE — Progress Notes (Signed)
 Subjective:    Patient ID: Colleen Duran, female    DOB: [birth year], 78 y.o.   MRN: [id number]  Chief Complaint  Patient presents with   Medical Management of Chronic Issues    B/P check 3 week f/u   Pt presents to the office today to recheck HTN. She was seen on 03/28/24 and we increased her spironolactone  25 mg from 12.5 mg. Her BP is at goal today!  She is a diabetic, her last A1C was 6.3.  Hypertension This is a chronic problem. The current episode started more than 1 year ago. The problem has been resolved since onset. The problem is controlled. Associated symptoms include malaise/fatigue, neck pain and peripheral edema (improving). Pertinent negatives include no shortness of breath. Risk factors for coronary artery disease include sedentary lifestyle and obesity. Past treatments include calcium  channel blockers, beta blockers, diuretics, lifestyle changes and angiotensin blockers. The current treatment provides moderate improvement. Compliance problems include medication side effects.   Neck Pain  This is a recurrent problem. The current episode started more than 1 year ago. The problem occurs intermittently. The problem has been waxing and waning.  Back Pain This is a chronic problem. The current episode started more than 1 year ago. The problem occurs intermittently. The pain is present in the lumbar spine. The quality of the pain is described as aching. The pain is at a severity of 7/10 (when it flares up). The pain is mild.      Review of Systems  Constitutional:  Positive for malaise/fatigue.  Respiratory:  Negative for shortness of breath.   Musculoskeletal:  Positive for back pain and neck pain.  All other systems reviewed and are negative.   Social History   Socioeconomic History   Marital status: Widowed    Spouse name: Not on file   Number of children: 2   Years of education: 36   Highest education level: 12th grade  Occupational History   Occupation: Retired     Comment: Retail buyer  Tobacco Use   Smoking status: Never   Smokeless tobacco: Never  Vaping Use   Vaping status: Never Used  Substance and Sexual Activity   Alcohol  use: No   Drug use: No   Sexual activity: Not Currently    Birth control/protection: Surgical  Other Topics Concern   Not on file  Social History Narrative   Colleen Duran lives with her daughter and grandchildren.    Social Drivers of Corporate investment banker Strain: Low Risk  (11/09/2023)   Overall Financial Resource Strain (CARDIA)    Difficulty of Paying Living Expenses: Not hard at all  Food Insecurity: No Food Insecurity (11/09/2023)   Hunger Vital Sign    Worried About Running Out of Food in the Last Year: Never true    Ran Out of Food in the Last Year: Never true  Transportation Needs: No Transportation Needs (11/09/2023)   PRAPARE - Administrator, Civil Service (Medical): No    Lack of Transportation (Non-Medical): No  Physical Activity: Insufficiently Active (11/09/2023)   Exercise Vital Sign    Days of Exercise per Week: 3 days    Minutes of Exercise per Session: 30 min  Stress: No Stress Concern Present (11/09/2023)   [name]    Feeling of Stress : Not at all  Social Connections: Moderately Isolated (11/09/2023)   Social Connection and Isolation Panel    Frequency of Communication with  Friends and Family: More than three times a week    Frequency of Social Gatherings with Friends and Family: Three times a week    Attends Religious Services: More than 4 times per year    Active Member of Clubs or Organizations: No    Attends Banker Meetings: Never    Marital Status: Widowed   Family History  Problem Relation Age of Onset   Hypertension Mother    Stroke Mother    Cancer Sister        lung   Healthy Daughter    Healthy Daughter    Cancer Brother    Mental retardation Brother    Breast cancer  Neg Hx         Objective:   Physical Exam Vitals reviewed.  Constitutional:      General: She is not in acute distress.    Appearance: She is well-developed. She is obese.  HENT:     Head: Normocephalic and atraumatic.  Eyes:     Pupils: Pupils are equal, round, and reactive to light.  Neck:     Thyroid : No thyromegaly.  Cardiovascular:     Rate and Rhythm: Normal rate and regular rhythm.     Heart sounds: Normal heart sounds. No murmur heard. Pulmonary:     Effort: Pulmonary effort is normal. No respiratory distress.     Breath sounds: Normal breath sounds. No wheezing.  Abdominal:     General: Bowel sounds are normal. There is no distension.     Palpations: Abdomen is soft.     Tenderness: There is no abdominal tenderness.  Musculoskeletal:        General: No tenderness. Normal range of motion.     Cervical back: Normal range of motion and neck supple.  Skin:    General: Skin is warm and dry.  Neurological:     Mental Status: She is alert and oriented to person, place, and time.     Cranial Nerves: No cranial nerve deficit.     Deep Tendon Reflexes: Reflexes are normal and symmetric.  Psychiatric:        Behavior: Behavior normal.        Thought Content: Thought content normal.        Judgment: Judgment normal.       BP 128/71   Pulse 66   Temp 98 F (36.7 C)   Ht 5' 7 (1.702 m)   Wt 204 lb (92.5 kg)   SpO2 98%   BMI 31.95 kg/m      Assessment & Plan:  Colleen Duran comes in today with chief complaint of Medical Management of Chronic Issues (B/P check/3 week f/u)   Diagnosis and orders addressed:  1. Hypertension associated with diabetes (HCC) (Primary) - BMP8+EGFR  2. Chronic bilateral low back pain without sciatica - Baclofen  5 MG TABS; Take 0.5 tablets (2.5 mg total) by mouth 3 (three) times daily as needed (muscle stiffness).  Dispense: 30 tablet; Refill: 2 - traMADol  (ULTRAM ) 50 MG tablet; Take 1 tablet (50 mg total) by mouth 2 (two) times  daily as needed.  Dispense: 30 tablet; Refill: 1  3. Neck stiffness - Baclofen  5 MG TABS; Take 0.5 tablets (2.5 mg total) by mouth 3 (three) times daily as needed (muscle stiffness).  Dispense: 30 tablet; Refill: 2   Labs pending Continue current medications  Will refill baclofen  and Ultram , pt will take tylenol  first.  Keep follow up with specialists  Health Maintenance reviewed Diet  and exercise encouraged  Return in about 6 months (around 10/12/2024), or if symptoms worsen or fail to improve.    Bari Learn, FNP

## 2024-04-12 ENCOUNTER — Ambulatory Visit: Payer: Self-pay | Admitting: Family

## 2024-05-21 ENCOUNTER — Other Ambulatory Visit: Payer: Self-pay | Admitting: Family

## 2024-05-21 DIAGNOSIS — I152 Hypertension secondary to endocrine disorders: Secondary | ICD-10-CM

## 2024-06-27 ENCOUNTER — Ambulatory Visit (INDEPENDENT_AMBULATORY_CARE_PROVIDER_SITE_OTHER): Admitting: Family

## 2024-06-27 ENCOUNTER — Other Ambulatory Visit: Payer: Self-pay | Admitting: Family

## 2024-06-27 ENCOUNTER — Encounter: Payer: Self-pay | Admitting: Family

## 2024-06-27 VITALS — BP 138/79 | HR 60 | Temp 97.2°F | Ht 67.0 in | Wt 206.8 lb

## 2024-06-27 DIAGNOSIS — M150 Primary generalized (osteo)arthritis: Secondary | ICD-10-CM

## 2024-06-27 DIAGNOSIS — E119 Type 2 diabetes mellitus without complications: Secondary | ICD-10-CM

## 2024-06-27 DIAGNOSIS — E1159 Type 2 diabetes mellitus with other circulatory complications: Secondary | ICD-10-CM | POA: Diagnosis not present

## 2024-06-27 DIAGNOSIS — G8929 Other chronic pain: Secondary | ICD-10-CM

## 2024-06-27 DIAGNOSIS — E669 Obesity, unspecified: Secondary | ICD-10-CM | POA: Diagnosis not present

## 2024-06-27 DIAGNOSIS — M8580 Other specified disorders of bone density and structure, unspecified site: Secondary | ICD-10-CM

## 2024-06-27 DIAGNOSIS — Z7984 Long term (current) use of oral hypoglycemic drugs: Secondary | ICD-10-CM

## 2024-06-27 DIAGNOSIS — M436 Torticollis: Secondary | ICD-10-CM

## 2024-06-27 DIAGNOSIS — E1169 Type 2 diabetes mellitus with other specified complication: Secondary | ICD-10-CM | POA: Diagnosis not present

## 2024-06-27 DIAGNOSIS — I152 Hypertension secondary to endocrine disorders: Secondary | ICD-10-CM

## 2024-06-27 DIAGNOSIS — Z23 Encounter for immunization: Secondary | ICD-10-CM | POA: Diagnosis not present

## 2024-06-27 DIAGNOSIS — E785 Hyperlipidemia, unspecified: Secondary | ICD-10-CM

## 2024-06-27 DIAGNOSIS — M545 Low back pain, unspecified: Secondary | ICD-10-CM

## 2024-06-27 MED ORDER — METFORMIN HCL 500 MG PO TABS: 500.0000 mg | ORAL_TABLET | Freq: Two times a day (BID) | ORAL | 1 refills | Status: AC

## 2024-06-27 MED ORDER — BACLOFEN 5 MG PO TABS: 2.5000 mg | ORAL_TABLET | Freq: Three times a day (TID) | ORAL | 2 refills | Status: AC | PRN

## 2024-06-27 MED ORDER — ATORVASTATIN CALCIUM 20 MG PO TABS: 20.0000 mg | ORAL_TABLET | Freq: Every day | ORAL | 3 refills | Status: AC

## 2024-06-27 MED ORDER — TRAMADOL HCL 50 MG PO TABS: 50.0000 mg | ORAL_TABLET | Freq: Two times a day (BID) | ORAL | 1 refills | Status: AC | PRN

## 2024-06-27 MED ORDER — SPIRONOLACTONE 25 MG PO TABS
12.5000 mg | ORAL_TABLET | Freq: Every day | ORAL | 1 refills | Status: AC
Start: 2024-06-27 — End: ?

## 2024-06-27 NOTE — Patient Instructions (Signed)

## 2024-06-27 NOTE — Progress Notes (Signed)
 Subjective:    Patient ID: Colleen Duran, female    DOB: October 05, 1945, 78 y.o.   MRN: [id number]  Chief Complaint  Patient presents with   Medical Management of Chronic Issues   PT presents to the office today for chronic follow up.    She has osteopenia and taking calcium  and vit d daily. Last dexa scan 09/29/23.  Diabetes She presents for her follow-up diabetic visit. She has type 2 diabetes mellitus. Pertinent negatives for diabetes include no blurred vision and no foot paresthesias. Symptoms are stable. Diabetic complications include peripheral neuropathy. Risk factors for coronary artery disease include dyslipidemia, diabetes mellitus, hypertension and sedentary lifestyle. She is following a generally healthy diet. Her overall blood glucose range is 130-140 mg/dl. Eye exam is current.  Hypertension This is a chronic problem. The current episode started more than 1 year ago. The problem has been resolved since onset. The problem is controlled. Associated symptoms include malaise/fatigue and peripheral edema. Pertinent negatives include no blurred vision or shortness of breath. Risk factors for coronary artery disease include diabetes mellitus, dyslipidemia, obesity and sedentary lifestyle. The current treatment provides moderate improvement.  Hyperlipidemia This is a chronic problem. The current episode started more than 1 year ago. The problem is uncontrolled. Recent lipid tests were reviewed and are high. Exacerbating diseases include obesity. Pertinent negatives include no shortness of breath. Current antihyperlipidemic treatment includes ezetimibe  and statins. The current treatment provides mild improvement of lipids. Risk factors for coronary artery disease include dyslipidemia, diabetes mellitus, hypertension, a sedentary lifestyle, post-menopausal and obesity.  Arthritis Presents for follow-up visit. She complains of pain and stiffness. The symptoms have been stable. Affected locations  include the left knee, right knee, right MCP and left MCP. Her pain is at a severity of 4/10.    Current opioids rx- Ultram  50 mg # meds rx- 30 Effectiveness of current meds-stable Adverse reactions from pain meds-none Morphine equivalent- 10  Pill count performed-No Last drug screen - 06/27/24 ( high risk q16m, moderate risk q26m, low risk yearly ) Urine drug screen today- Yes Was the [facility] reviewed- yes  If yes were their any concerning findings? - none Pain contract signed on: 06/27/24   Review of Systems  Constitutional:  Positive for malaise/fatigue.  Eyes:  Negative for blurred vision.  Respiratory:  Negative for shortness of breath.   Musculoskeletal:  Positive for stiffness.  All other systems reviewed and are negative.      Family History  Problem Relation Age of Onset   Hypertension Mother    Stroke Mother    Cancer Sister        lung   Healthy Daughter    Healthy Daughter    Cancer Brother    Mental retardation Brother    Breast cancer Neg Hx    Social History   Socioeconomic History   Marital status: Widowed    Spouse name: Not on file   Number of children: 2   Years of education: 71   Highest education level: 12th grade  Occupational History   Occupation: Retired    Comment: Retail buyer  Tobacco Use   Smoking status: Never   Smokeless tobacco: Never  Vaping Use   Vaping status: Never Used  Substance and Sexual Activity   Alcohol  use: No   Drug use: No   Sexual activity: Not Currently    Birth control/protection: Surgical  Other Topics Concern   Not on file  Social History Narrative   [name]  lives with her daughter and grandchildren.    Social Drivers of Corporate investment banker Strain: Low Risk  (11/09/2023)   Overall Financial Resource Strain (CARDIA)    Difficulty of Paying Living Expenses: Not hard at all  Food Insecurity: No Food Insecurity (11/09/2023)   Hunger Vital Sign    Worried About Running Out of Food in  the Last Year: Never true    Ran Out of Food in the Last Year: Never true  Transportation Needs: No Transportation Needs (11/09/2023)   PRAPARE - Administrator, Civil Service (Medical): No    Lack of Transportation (Non-Medical): No  Physical Activity: Insufficiently Active (11/09/2023)   Exercise Vital Sign    Days of Exercise per Week: 3 days    Minutes of Exercise per Session: 30 min  Stress: No Stress Concern Present (11/09/2023)   [name]    Feeling of Stress : Not at all  Social Connections: Moderately Isolated (11/09/2023)   Social Connection and Isolation Panel    Frequency of Communication with Friends and Family: More than three times a week    Frequency of Social Gatherings with Friends and Family: Three times a week    Attends Religious Services: More than 4 times per year    Active Member of Clubs or Organizations: No    Attends Banker Meetings: Never    Marital Status: Widowed    Objective:   Physical Exam Vitals reviewed.  Constitutional:      General: She is not in acute distress.    Appearance: She is well-developed. She is obese.  HENT:     Head: Normocephalic and atraumatic.     Right Ear: Tympanic membrane normal.     Left Ear: Tympanic membrane normal.     Ears:     Comments: HOH  Eyes:     Pupils: Pupils are equal, round, and reactive to light.  Neck:     Thyroid : No thyromegaly.  Cardiovascular:     Rate and Rhythm: Normal rate and regular rhythm.     Heart sounds: Normal heart sounds. No murmur heard. Pulmonary:     Effort: Pulmonary effort is normal. No respiratory distress.     Breath sounds: Normal breath sounds. No wheezing.  Abdominal:     General: Bowel sounds are normal. There is no distension.     Palpations: Abdomen is soft.     Tenderness: There is no abdominal tenderness.  Musculoskeletal:        General: No tenderness. Normal range of motion.      Cervical back: Normal range of motion and neck supple.  Skin:    General: Skin is warm and dry.  Neurological:     Mental Status: She is alert and oriented to person, place, and time.     Cranial Nerves: No cranial nerve deficit.     Deep Tendon Reflexes: Reflexes are normal and symmetric.  Psychiatric:        Behavior: Behavior normal.        Thought Content: Thought content normal.        Judgment: Judgment normal.       BP 138/79   Pulse 60   Temp (!) 97.2 F (36.2 C) (Temporal)   Ht 5' 7 (1.702 m)   Wt 206 lb 12.8 oz (93.8 kg)   SpO2 98%   BMI 32.39 kg/m      Assessment & Plan:  Colleen Duran comes in today with chief complaint of Medical Management of Chronic Issues   Diagnosis and orders addressed:  1. Chronic bilateral low back pain without sciatica - Baclofen  5 MG TABS; Take 0.5 tablets (2.5 mg total) by mouth 3 (three) times daily as needed (muscle stiffness).  Dispense: 30 tablet; Refill: 2 - traMADol  (ULTRAM ) 50 MG tablet; Take 1 tablet (50 mg total) by mouth 2 (two) times daily as needed.  Dispense: 30 tablet; Refill: 1 - CMP14+EGFR  2. Neck stiffness - Baclofen  5 MG TABS; Take 0.5 tablets (2.5 mg total) by mouth 3 (three) times daily as needed (muscle stiffness).  Dispense: 30 tablet; Refill: 2 - CMP14+EGFR  3. Type 2 diabetes mellitus without complication, without long-term current use of insulin  (HCC) - metFORMIN  (GLUCOPHAGE ) 500 MG tablet; Take 1 tablet (500 mg total) by mouth 2 (two) times daily with a meal.  Dispense: 180 tablet; Refill: 1 - CMP14+EGFR  4. Hypertension associated with diabetes (HCC)  - spironolactone  (ALDACTONE ) 25 MG tablet; Take 0.5 tablets (12.5 mg total) by mouth daily.  Dispense: 45 tablet; Refill: 1 - CMP14+EGFR  5. Encounter for immunization (Primary) - Flu vaccine HIGH DOSE PF(Fluzone  Trivalent)  6. Hyperlipidemia associated with type 2 diabetes mellitus (HCC) - atorvastatin  (LIPITOR) 20 MG tablet; Take 1 tablet (20  mg total) by mouth daily.  Dispense: 90 tablet; Refill: 3 - CMP14+EGFR  7. Obesity (BMI 30-39.9) - CMP14+EGFR  8. Primary osteoarthritis involving multiple joints - traMADol  (ULTRAM ) 50 MG tablet; Take 1 tablet (50 mg total) by mouth 2 (two) times daily as needed.  Dispense: 30 tablet; Refill: 1 - ToxASSURE Select 13 (MW), Urine - CMP14+EGFR  9. Osteopenia, unspecified location - CMP14+EGFR   Labs pending Patient reviewed in [place] controlled database, no flags noted. Contract and drug screen are up to date.  Continue medications  Health Maintenance reviewed Diet and exercise encouraged  Follow up plan: 3 months   Bari Learn, FNP

## 2024-06-29 LAB — TOXASSURE SELECT 13 (MW), URINE

## 2024-06-30 ENCOUNTER — Ambulatory Visit: Payer: Self-pay | Admitting: Family

## 2024-07-30 ENCOUNTER — Other Ambulatory Visit: Payer: Self-pay | Admitting: Family

## 2024-07-30 DIAGNOSIS — E1159 Type 2 diabetes mellitus with other circulatory complications: Secondary | ICD-10-CM

## 2024-09-27 ENCOUNTER — Encounter: Payer: Self-pay | Admitting: *Deleted

## 2024-10-08 ENCOUNTER — Other Ambulatory Visit: Payer: Self-pay | Admitting: Family

## 2024-10-08 DIAGNOSIS — E1159 Type 2 diabetes mellitus with other circulatory complications: Secondary | ICD-10-CM

## 2024-10-11 ENCOUNTER — Ambulatory Visit: Admitting: Family

## 2024-10-14 ENCOUNTER — Ambulatory Visit: Payer: Self-pay | Admitting: Family

## 2024-10-14 ENCOUNTER — Ambulatory Visit: Admitting: Family

## 2024-11-04 ENCOUNTER — Ambulatory Visit: Admitting: Family

## 2024-11-09 ENCOUNTER — Ambulatory Visit: Payer: Self-pay
